# Patient Record
Sex: Male | Born: 1980 | Race: Black or African American | Hispanic: No | Marital: Married | Smoking: Former smoker
Health system: Southern US, Community
[De-identification: ages and names within clinical notes are randomized; demographics above are authoritative.]

## PROBLEM LIST (undated history)

## (undated) DIAGNOSIS — R0602 Shortness of breath: Secondary | ICD-10-CM

## (undated) DIAGNOSIS — G43909 Migraine, unspecified, not intractable, without status migrainosus: Secondary | ICD-10-CM

## (undated) DIAGNOSIS — E669 Obesity, unspecified: Secondary | ICD-10-CM

## (undated) DIAGNOSIS — E1140 Type 2 diabetes mellitus with diabetic neuropathy, unspecified: Secondary | ICD-10-CM

## (undated) DIAGNOSIS — E785 Hyperlipidemia, unspecified: Secondary | ICD-10-CM

## (undated) DIAGNOSIS — I10 Essential (primary) hypertension: Secondary | ICD-10-CM

## (undated) DIAGNOSIS — I209 Angina pectoris, unspecified: Secondary | ICD-10-CM

## (undated) DIAGNOSIS — Z87442 Personal history of urinary calculi: Secondary | ICD-10-CM

## (undated) DIAGNOSIS — F419 Anxiety disorder, unspecified: Secondary | ICD-10-CM

## (undated) DIAGNOSIS — M51369 Other intervertebral disc degeneration, lumbar region without mention of lumbar back pain or lower extremity pain: Secondary | ICD-10-CM

## (undated) DIAGNOSIS — G4730 Sleep apnea, unspecified: Secondary | ICD-10-CM

## (undated) DIAGNOSIS — M5136 Other intervertebral disc degeneration, lumbar region: Secondary | ICD-10-CM

## (undated) DIAGNOSIS — K5090 Crohn's disease, unspecified, without complications: Secondary | ICD-10-CM

## (undated) DIAGNOSIS — J189 Pneumonia, unspecified organism: Secondary | ICD-10-CM

## (undated) DIAGNOSIS — K219 Gastro-esophageal reflux disease without esophagitis: Secondary | ICD-10-CM

## (undated) DIAGNOSIS — F32A Depression, unspecified: Secondary | ICD-10-CM

## (undated) DIAGNOSIS — N200 Calculus of kidney: Secondary | ICD-10-CM

## (undated) HISTORY — DX: Personal history of urinary calculi: Z87.442

## (undated) HISTORY — DX: Depression, unspecified: F32.A

## (undated) HISTORY — DX: Hyperlipidemia, unspecified: E78.5

## (undated) HISTORY — PX: TONSILLECTOMY: SUR1361

## (undated) HISTORY — DX: Migraine, unspecified, not intractable, without status migrainosus: G43.909

## (undated) HISTORY — PX: CHOLECYSTECTOMY: SHX55

## (undated) HISTORY — PX: GASTRIC BYPASS: SHX52

## (undated) HISTORY — DX: Crohn's disease, unspecified, without complications: K50.90

## (undated) HISTORY — DX: Other intervertebral disc degeneration, lumbar region without mention of lumbar back pain or lower extremity pain: M51.369

## (undated) HISTORY — DX: Essential (primary) hypertension: I10

## (undated) HISTORY — DX: Obesity, unspecified: E66.9

## (undated) HISTORY — DX: Other intervertebral disc degeneration, lumbar region: M51.36

## (undated) HISTORY — DX: Calculus of kidney: N20.0

---

## 2004-05-30 ENCOUNTER — Emergency Department (HOSPITAL_COMMUNITY): Admission: EM | Admit: 2004-05-30 | Discharge: 2004-05-30 | Payer: Self-pay | Admitting: Emergency Medicine

## 2004-07-24 ENCOUNTER — Emergency Department (HOSPITAL_COMMUNITY): Admission: EM | Admit: 2004-07-24 | Discharge: 2004-07-24 | Payer: Self-pay | Admitting: Emergency Medicine

## 2004-08-11 ENCOUNTER — Ambulatory Visit: Payer: Self-pay | Admitting: Family Medicine

## 2004-10-04 ENCOUNTER — Emergency Department (HOSPITAL_COMMUNITY): Admission: EM | Admit: 2004-10-04 | Discharge: 2004-10-04 | Payer: Self-pay | Admitting: Emergency Medicine

## 2004-10-10 ENCOUNTER — Ambulatory Visit: Payer: Self-pay | Admitting: Family Medicine

## 2004-10-11 ENCOUNTER — Emergency Department (HOSPITAL_COMMUNITY): Admission: EM | Admit: 2004-10-11 | Discharge: 2004-10-11 | Payer: Self-pay | Admitting: Emergency Medicine

## 2004-10-15 ENCOUNTER — Encounter (HOSPITAL_COMMUNITY): Admission: RE | Admit: 2004-10-15 | Discharge: 2004-11-14 | Payer: Self-pay | Admitting: Family Medicine

## 2004-10-20 ENCOUNTER — Ambulatory Visit: Payer: Self-pay | Admitting: Family Medicine

## 2004-12-15 ENCOUNTER — Emergency Department (HOSPITAL_COMMUNITY): Admission: EM | Admit: 2004-12-15 | Discharge: 2004-12-15 | Payer: Self-pay | Admitting: Emergency Medicine

## 2005-05-01 ENCOUNTER — Emergency Department (HOSPITAL_COMMUNITY): Admission: EM | Admit: 2005-05-01 | Discharge: 2005-05-01 | Payer: Self-pay | Admitting: Emergency Medicine

## 2005-05-27 ENCOUNTER — Emergency Department (HOSPITAL_COMMUNITY): Admission: EM | Admit: 2005-05-27 | Discharge: 2005-05-28 | Payer: Self-pay | Admitting: Emergency Medicine

## 2006-10-24 ENCOUNTER — Emergency Department (HOSPITAL_COMMUNITY): Admission: EM | Admit: 2006-10-24 | Discharge: 2006-10-24 | Payer: Self-pay | Admitting: Emergency Medicine

## 2007-04-14 ENCOUNTER — Emergency Department (HOSPITAL_COMMUNITY): Admission: EM | Admit: 2007-04-14 | Discharge: 2007-04-14 | Payer: Self-pay | Admitting: Emergency Medicine

## 2007-08-07 ENCOUNTER — Emergency Department: Payer: Self-pay | Admitting: Emergency Medicine

## 2007-08-07 ENCOUNTER — Other Ambulatory Visit: Payer: Self-pay

## 2008-05-18 HISTORY — PX: KNEE ARTHROSCOPY: SUR90

## 2008-07-01 ENCOUNTER — Emergency Department (HOSPITAL_COMMUNITY): Admission: EM | Admit: 2008-07-01 | Discharge: 2008-07-01 | Payer: Self-pay | Admitting: Emergency Medicine

## 2008-07-24 ENCOUNTER — Emergency Department (HOSPITAL_COMMUNITY): Admission: EM | Admit: 2008-07-24 | Discharge: 2008-07-24 | Payer: Self-pay | Admitting: Emergency Medicine

## 2009-02-19 ENCOUNTER — Emergency Department (HOSPITAL_COMMUNITY): Admission: EM | Admit: 2009-02-19 | Discharge: 2009-02-19 | Payer: Self-pay | Admitting: Emergency Medicine

## 2009-02-26 ENCOUNTER — Emergency Department (HOSPITAL_COMMUNITY): Admission: EM | Admit: 2009-02-26 | Discharge: 2009-02-26 | Payer: Self-pay | Admitting: Emergency Medicine

## 2009-04-17 DIAGNOSIS — J189 Pneumonia, unspecified organism: Secondary | ICD-10-CM

## 2009-04-17 HISTORY — DX: Pneumonia, unspecified organism: J18.9

## 2009-05-18 HISTORY — PX: LUMBAR FUSION: SHX111

## 2009-05-21 ENCOUNTER — Ambulatory Visit: Payer: Self-pay | Admitting: Internal Medicine

## 2009-05-21 DIAGNOSIS — G43909 Migraine, unspecified, not intractable, without status migrainosus: Secondary | ICD-10-CM | POA: Insufficient documentation

## 2009-05-21 DIAGNOSIS — Z87898 Personal history of other specified conditions: Secondary | ICD-10-CM

## 2009-05-21 DIAGNOSIS — J45909 Unspecified asthma, uncomplicated: Secondary | ICD-10-CM | POA: Insufficient documentation

## 2009-05-21 DIAGNOSIS — Z87442 Personal history of urinary calculi: Secondary | ICD-10-CM | POA: Insufficient documentation

## 2009-05-21 DIAGNOSIS — I10 Essential (primary) hypertension: Secondary | ICD-10-CM | POA: Insufficient documentation

## 2009-05-23 ENCOUNTER — Encounter (INDEPENDENT_AMBULATORY_CARE_PROVIDER_SITE_OTHER): Payer: Self-pay | Admitting: *Deleted

## 2009-05-27 ENCOUNTER — Ambulatory Visit: Payer: Self-pay | Admitting: Internal Medicine

## 2009-05-27 DIAGNOSIS — IMO0002 Reserved for concepts with insufficient information to code with codable children: Secondary | ICD-10-CM | POA: Insufficient documentation

## 2009-07-31 ENCOUNTER — Emergency Department (HOSPITAL_COMMUNITY): Admission: EM | Admit: 2009-07-31 | Discharge: 2009-07-31 | Payer: Self-pay | Admitting: Emergency Medicine

## 2009-08-01 ENCOUNTER — Ambulatory Visit: Payer: Self-pay | Admitting: Internal Medicine

## 2009-08-01 DIAGNOSIS — M545 Low back pain, unspecified: Secondary | ICD-10-CM | POA: Insufficient documentation

## 2009-08-05 LAB — CONVERTED CEMR LAB
Basophils Absolute: 0 10*3/uL (ref 0.0–0.1)
Basophils Relative: 0.3 % (ref 0.0–3.0)
Eosinophils Absolute: 0 10*3/uL (ref 0.0–0.7)
Eosinophils Relative: 0.7 % (ref 0.0–5.0)
HCT: 43.3 % (ref 39.0–52.0)
Hemoglobin: 14.3 g/dL (ref 13.0–17.0)
Lymphocytes Relative: 42.9 % (ref 12.0–46.0)
Lymphs Abs: 1.9 10*3/uL (ref 0.7–4.0)
MCHC: 33.2 g/dL (ref 30.0–36.0)
MCV: 89.5 fL (ref 78.0–100.0)
Monocytes Absolute: 0.6 10*3/uL (ref 0.1–1.0)
Monocytes Relative: 13.4 % — ABNORMAL HIGH (ref 3.0–12.0)
Neutro Abs: 1.8 10*3/uL (ref 1.4–7.7)
Neutrophils Relative %: 42.7 % — ABNORMAL LOW (ref 43.0–77.0)
Platelets: 149 10*3/uL — ABNORMAL LOW (ref 150.0–400.0)
RBC: 4.84 M/uL (ref 4.22–5.81)
RDW: 12.4 % (ref 11.5–14.6)
WBC: 4.3 10*3/uL — ABNORMAL LOW (ref 4.5–10.5)

## 2009-10-06 ENCOUNTER — Encounter: Payer: Self-pay | Admitting: Internal Medicine

## 2009-10-11 ENCOUNTER — Ambulatory Visit: Payer: Self-pay | Admitting: Internal Medicine

## 2009-10-11 DIAGNOSIS — F17200 Nicotine dependence, unspecified, uncomplicated: Secondary | ICD-10-CM

## 2009-10-11 DIAGNOSIS — E876 Hypokalemia: Secondary | ICD-10-CM

## 2009-10-11 DIAGNOSIS — L259 Unspecified contact dermatitis, unspecified cause: Secondary | ICD-10-CM

## 2009-10-11 DIAGNOSIS — R1013 Epigastric pain: Secondary | ICD-10-CM

## 2009-10-27 ENCOUNTER — Encounter: Payer: Self-pay | Admitting: Internal Medicine

## 2009-10-30 ENCOUNTER — Emergency Department: Payer: Self-pay | Admitting: Emergency Medicine

## 2009-11-03 ENCOUNTER — Encounter: Payer: Self-pay | Admitting: Internal Medicine

## 2009-11-03 ENCOUNTER — Emergency Department: Payer: Self-pay | Admitting: Emergency Medicine

## 2009-11-08 ENCOUNTER — Encounter: Payer: Self-pay | Admitting: Internal Medicine

## 2009-11-08 DIAGNOSIS — K219 Gastro-esophageal reflux disease without esophagitis: Secondary | ICD-10-CM

## 2009-12-01 ENCOUNTER — Emergency Department: Payer: Self-pay | Admitting: Emergency Medicine

## 2009-12-13 ENCOUNTER — Encounter: Payer: Self-pay | Admitting: Internal Medicine

## 2009-12-13 ENCOUNTER — Emergency Department: Payer: Self-pay | Admitting: Emergency Medicine

## 2009-12-20 ENCOUNTER — Emergency Department: Payer: Self-pay | Admitting: Emergency Medicine

## 2010-01-01 ENCOUNTER — Encounter: Payer: Self-pay | Admitting: Internal Medicine

## 2010-01-29 ENCOUNTER — Emergency Department: Payer: Self-pay | Admitting: Emergency Medicine

## 2010-01-29 ENCOUNTER — Encounter: Payer: Self-pay | Admitting: Internal Medicine

## 2010-02-03 ENCOUNTER — Ambulatory Visit: Payer: Self-pay | Admitting: Internal Medicine

## 2010-02-03 DIAGNOSIS — M5137 Other intervertebral disc degeneration, lumbosacral region: Secondary | ICD-10-CM

## 2010-02-03 DIAGNOSIS — R42 Dizziness and giddiness: Secondary | ICD-10-CM

## 2010-02-17 ENCOUNTER — Emergency Department: Payer: Self-pay | Admitting: Emergency Medicine

## 2010-04-12 ENCOUNTER — Emergency Department (HOSPITAL_COMMUNITY): Admission: EM | Admit: 2010-04-12 | Discharge: 2010-04-12 | Payer: Self-pay | Admitting: Emergency Medicine

## 2010-04-30 ENCOUNTER — Inpatient Hospital Stay (HOSPITAL_COMMUNITY)
Admission: RE | Admit: 2010-04-30 | Discharge: 2010-05-04 | Payer: Self-pay | Source: Home / Self Care | Attending: Neurological Surgery | Admitting: Neurological Surgery

## 2010-04-30 HISTORY — PX: LUMBAR FUSION: SHX111

## 2010-04-30 HISTORY — PX: BACK SURGERY: SHX140

## 2010-05-06 ENCOUNTER — Emergency Department: Payer: Self-pay | Admitting: Unknown Physician Specialty

## 2010-05-09 ENCOUNTER — Encounter
Admission: RE | Admit: 2010-05-09 | Discharge: 2010-05-09 | Payer: Self-pay | Source: Home / Self Care | Attending: Neurological Surgery | Admitting: Neurological Surgery

## 2010-05-16 ENCOUNTER — Emergency Department (HOSPITAL_COMMUNITY)
Admission: EM | Admit: 2010-05-16 | Discharge: 2010-05-16 | Payer: Self-pay | Source: Home / Self Care | Admitting: Emergency Medicine

## 2010-05-17 ENCOUNTER — Encounter: Payer: Self-pay | Admitting: Internal Medicine

## 2010-05-18 ENCOUNTER — Encounter: Payer: Self-pay | Admitting: Internal Medicine

## 2010-05-18 ENCOUNTER — Inpatient Hospital Stay: Payer: Self-pay | Admitting: Internal Medicine

## 2010-05-18 DIAGNOSIS — M961 Postlaminectomy syndrome, not elsewhere classified: Secondary | ICD-10-CM | POA: Insufficient documentation

## 2010-05-18 HISTORY — PX: KIDNEY STONE SURGERY: SHX686

## 2010-05-18 HISTORY — PX: WISDOM TOOTH EXTRACTION: SHX21

## 2010-05-19 ENCOUNTER — Encounter: Payer: Self-pay | Admitting: Internal Medicine

## 2010-05-20 ENCOUNTER — Encounter: Payer: Self-pay | Admitting: Internal Medicine

## 2010-05-21 ENCOUNTER — Ambulatory Visit
Admission: RE | Admit: 2010-05-21 | Discharge: 2010-05-21 | Payer: Self-pay | Source: Home / Self Care | Attending: Internal Medicine | Admitting: Internal Medicine

## 2010-05-21 DIAGNOSIS — E860 Dehydration: Secondary | ICD-10-CM | POA: Insufficient documentation

## 2010-05-21 DIAGNOSIS — B369 Superficial mycosis, unspecified: Secondary | ICD-10-CM | POA: Insufficient documentation

## 2010-05-21 DIAGNOSIS — J158 Pneumonia due to other specified bacteria: Secondary | ICD-10-CM | POA: Insufficient documentation

## 2010-05-21 LAB — CONVERTED CEMR LAB
Bilirubin Urine: NEGATIVE
Glucose, Urine, Semiquant: NEGATIVE
Ketones, urine, test strip: NEGATIVE
Urobilinogen, UA: 0.2
pH: 8.5

## 2010-05-22 ENCOUNTER — Other Ambulatory Visit: Payer: Self-pay | Admitting: Internal Medicine

## 2010-05-22 ENCOUNTER — Ambulatory Visit
Admission: RE | Admit: 2010-05-22 | Discharge: 2010-05-22 | Payer: Self-pay | Source: Home / Self Care | Attending: Internal Medicine | Admitting: Internal Medicine

## 2010-05-22 LAB — CBC WITH DIFFERENTIAL/PLATELET
Basophils Absolute: 0 10*3/uL (ref 0.0–0.1)
Basophils Relative: 0.3 % (ref 0.0–3.0)
Eosinophils Absolute: 0 10*3/uL (ref 0.0–0.7)
Eosinophils Relative: 0.6 % (ref 0.0–5.0)
HCT: 43.2 % (ref 39.0–52.0)
Hemoglobin: 14.7 g/dL (ref 13.0–17.0)
Lymphocytes Relative: 34.8 % (ref 12.0–46.0)
Lymphs Abs: 2.3 10*3/uL (ref 0.7–4.0)
MCHC: 34 g/dL (ref 30.0–36.0)
MCV: 86.9 fl (ref 78.0–100.0)
Monocytes Absolute: 0.7 10*3/uL (ref 0.1–1.0)
Monocytes Relative: 10.7 % (ref 3.0–12.0)
Neutro Abs: 3.6 10*3/uL (ref 1.4–7.7)
Neutrophils Relative %: 53.6 % (ref 43.0–77.0)
Platelets: 173 10*3/uL (ref 150.0–400.0)
RBC: 4.96 Mil/uL (ref 4.22–5.81)
RDW: 13.4 % (ref 11.5–14.6)
WBC: 6.7 10*3/uL (ref 4.5–10.5)

## 2010-05-22 LAB — BASIC METABOLIC PANEL
BUN: 15 mg/dL (ref 6–23)
CO2: 28 mEq/L (ref 19–32)
Calcium: 9.2 mg/dL (ref 8.4–10.5)
Chloride: 99 mEq/L (ref 96–112)
Creatinine, Ser: 1 mg/dL (ref 0.4–1.5)
GFR: 119.86 mL/min (ref >60.00)
Glucose, Bld: 95 mg/dL (ref 70–99)
Potassium: 4.2 mEq/L (ref 3.5–5.1)
Sodium: 136 mEq/L (ref 135–145)

## 2010-05-22 LAB — HEPATIC FUNCTION PANEL
ALT: 61 U/L — ABNORMAL HIGH (ref 0–53)
AST: 42 U/L — ABNORMAL HIGH (ref 0–37)
Albumin: 3.8 g/dL (ref 3.5–5.2)
Alkaline Phosphatase: 52 U/L (ref 39–117)
Bilirubin, Direct: 0.1 mg/dL (ref 0.0–0.3)
Total Bilirubin: 0.6 mg/dL (ref 0.3–1.2)
Total Protein: 6.9 g/dL (ref 6.0–8.3)

## 2010-05-23 ENCOUNTER — Encounter: Payer: Self-pay | Admitting: Internal Medicine

## 2010-05-23 ENCOUNTER — Telehealth: Payer: Self-pay | Admitting: Internal Medicine

## 2010-05-24 ENCOUNTER — Observation Stay (HOSPITAL_COMMUNITY)
Admission: EM | Admit: 2010-05-24 | Discharge: 2010-05-27 | Payer: Self-pay | Source: Home / Self Care | Attending: Internal Medicine | Admitting: Internal Medicine

## 2010-05-26 ENCOUNTER — Encounter (INDEPENDENT_AMBULATORY_CARE_PROVIDER_SITE_OTHER): Payer: Self-pay | Admitting: Internal Medicine

## 2010-05-27 ENCOUNTER — Encounter
Admission: RE | Admit: 2010-05-27 | Discharge: 2010-05-27 | Payer: Self-pay | Source: Home / Self Care | Attending: Neurological Surgery | Admitting: Neurological Surgery

## 2010-06-02 LAB — CBC
HCT: 41.4 % (ref 39.0–52.0)
HCT: 36.5 % — ABNORMAL LOW (ref 39.0–52.0)
HCT: 41.6 % (ref 39.0–52.0)
Hemoglobin: 13.6 g/dL (ref 13.0–17.0)
Hemoglobin: 12.1 g/dL — ABNORMAL LOW (ref 13.0–17.0)
Hemoglobin: 14 g/dL (ref 13.0–17.0)
MCH: 28.8 pg (ref 26.0–34.0)
MCH: 29.1 pg (ref 26.0–34.0)
MCH: 29 pg (ref 26.0–34.0)
MCHC: 32.9 g/dL (ref 30.0–36.0)
MCHC: 33.2 g/dL (ref 30.0–36.0)
MCHC: 33.7 g/dL (ref 30.0–36.0)
MCV: 87.5 fL (ref 78.0–100.0)
MCV: 87.7 fL (ref 78.0–100.0)
MCV: 86.3 fL (ref 78.0–100.0)
Platelets: 145 10*3/uL — ABNORMAL LOW (ref 150–400)
Platelets: 131 10*3/uL — ABNORMAL LOW (ref 150–400)
Platelets: 144 10*3/uL — ABNORMAL LOW (ref 150–400)
RBC: 4.73 MIL/uL (ref 4.22–5.81)
RBC: 4.16 MIL/uL — ABNORMAL LOW (ref 4.22–5.81)
RBC: 4.82 MIL/uL (ref 4.22–5.81)
RDW: 12.5 % (ref 11.5–15.5)
RDW: 12.7 % (ref 11.5–15.5)
RDW: 12.3 % (ref 11.5–15.5)
WBC: 7.5 10*3/uL (ref 4.0–10.5)
WBC: 5.7 10*3/uL (ref 4.0–10.5)
WBC: 4.7 10*3/uL (ref 4.0–10.5)

## 2010-06-02 LAB — DIFFERENTIAL
Basophils Absolute: 0 10*3/uL (ref 0.0–0.1)
Basophils Absolute: 0 10*3/uL (ref 0.0–0.1)
Basophils Relative: 0 % (ref 0–1)
Basophils Relative: 0 % (ref 0–1)
Eosinophils Absolute: 0.1 10*3/uL (ref 0.0–0.7)
Eosinophils Absolute: 0.1 10*3/uL (ref 0.0–0.7)
Eosinophils Relative: 1 % (ref 0–5)
Eosinophils Relative: 1 % (ref 0–5)
Lymphocytes Relative: 24 % (ref 12–46)
Lymphocytes Relative: 25 % (ref 12–46)
Lymphs Abs: 1.8 10*3/uL (ref 0.7–4.0)
Lymphs Abs: 1.4 10*3/uL (ref 0.7–4.0)
Monocytes Absolute: 0.8 10*3/uL (ref 0.1–1.0)
Monocytes Absolute: 0.7 10*3/uL (ref 0.1–1.0)
Monocytes Relative: 11 % (ref 3–12)
Monocytes Relative: 12 % (ref 3–12)
Neutro Abs: 4.8 10*3/uL (ref 1.7–7.7)
Neutro Abs: 3.5 10*3/uL (ref 1.7–7.7)
Neutrophils Relative %: 64 % (ref 43–77)
Neutrophils Relative %: 62 % (ref 43–77)

## 2010-06-02 LAB — HEMOGLOBIN A1C
Hgb A1c MFr Bld: 5.9 % — ABNORMAL HIGH (ref <5.7)
Hgb A1c MFr Bld: 5.7 % — ABNORMAL HIGH (ref <5.7)
Mean Plasma Glucose: 123 mg/dL — ABNORMAL HIGH (ref <117)
Mean Plasma Glucose: 117 mg/dL — ABNORMAL HIGH (ref <117)

## 2010-06-02 LAB — BASIC METABOLIC PANEL
BUN: 10 mg/dL (ref 6–23)
BUN: 8 mg/dL (ref 6–23)
CO2: 27 mEq/L (ref 19–32)
CO2: 30 mEq/L (ref 19–32)
Calcium: 8.6 mg/dL (ref 8.4–10.5)
Calcium: 9.5 mg/dL (ref 8.4–10.5)
Chloride: 106 mEq/L (ref 96–112)
Chloride: 99 mEq/L (ref 96–112)
Creatinine, Ser: 1.03 mg/dL (ref 0.4–1.5)
Creatinine, Ser: 1.07 mg/dL (ref 0.4–1.5)
GFR calc Af Amer: >60 mL/min (ref >60)
GFR calc Af Amer: >60 mL/min (ref >60)
GFR calc non Af Amer: >60 mL/min (ref >60)
GFR calc non Af Amer: >60 mL/min (ref >60)
Glucose, Bld: 108 mg/dL — ABNORMAL HIGH (ref 70–99)
Glucose, Bld: 93 mg/dL (ref 70–99)
Potassium: 3.7 mEq/L (ref 3.5–5.1)
Potassium: 4.4 mEq/L (ref 3.5–5.1)
Sodium: 139 mEq/L (ref 135–145)
Sodium: 140 mEq/L (ref 135–145)

## 2010-06-02 LAB — TROPONIN I
Troponin I: 0.01 ng/mL (ref 0.00–0.06)
Troponin I: 0.01 ng/mL (ref 0.00–0.06)
Troponin I: 0.01 ng/mL (ref 0.00–0.06)

## 2010-06-02 LAB — CK TOTAL AND CKMB (NOT AT ARMC)
CK, MB: 0.6 ng/mL (ref 0.3–4.0)
CK, MB: 0.7 ng/mL (ref 0.3–4.0)
CK, MB: 0.8 ng/mL (ref 0.3–4.0)
Relative Index: INVALID (ref 0.0–2.5)
Relative Index: INVALID (ref 0.0–2.5)
Relative Index: INVALID (ref 0.0–2.5)
Total CK: 88 U/L (ref 7–232)
Total CK: 87 U/L (ref 7–232)
Total CK: 91 U/L (ref 7–232)

## 2010-06-02 LAB — GLUCOSE, CAPILLARY
Glucose-Capillary: 101 mg/dL — ABNORMAL HIGH (ref 70–99)
Glucose-Capillary: 107 mg/dL — ABNORMAL HIGH (ref 70–99)
Glucose-Capillary: 107 mg/dL — ABNORMAL HIGH (ref 70–99)
Glucose-Capillary: 56 mg/dL — ABNORMAL LOW (ref 70–99)
Glucose-Capillary: 59 mg/dL — ABNORMAL LOW (ref 70–99)
Glucose-Capillary: 70 mg/dL (ref 70–99)
Glucose-Capillary: 85 mg/dL (ref 70–99)
Glucose-Capillary: 101 mg/dL — ABNORMAL HIGH (ref 70–99)
Glucose-Capillary: 129 mg/dL — ABNORMAL HIGH (ref 70–99)
Glucose-Capillary: 55 mg/dL — ABNORMAL LOW (ref 70–99)
Glucose-Capillary: 68 mg/dL — ABNORMAL LOW (ref 70–99)
Glucose-Capillary: 83 mg/dL (ref 70–99)
Glucose-Capillary: 83 mg/dL (ref 70–99)
Glucose-Capillary: 85 mg/dL (ref 70–99)
Glucose-Capillary: 85 mg/dL (ref 70–99)
Glucose-Capillary: 98 mg/dL (ref 70–99)

## 2010-06-02 LAB — POCT I-STAT, CHEM 8
BUN: 13 mg/dL (ref 6–23)
Calcium, Ion: 1.18 mmol/L (ref 1.12–1.32)
Chloride: 104 mEq/L (ref 96–112)
Creatinine, Ser: 1.2 mg/dL (ref 0.4–1.5)
Glucose, Bld: 96 mg/dL (ref 70–99)
HCT: 42 % (ref 39.0–52.0)
Hemoglobin: 14.3 g/dL (ref 13.0–17.0)
Potassium: 3.7 mEq/L (ref 3.5–5.1)
Sodium: 142 mEq/L (ref 135–145)
TCO2: 29 mmol/L (ref 0–100)

## 2010-06-02 LAB — POCT CARDIAC MARKERS
CKMB, poc: <1 ng/mL — ABNORMAL LOW (ref 1.0–8.0)
Myoglobin, poc: 28 ng/mL (ref 12–200)
Troponin i, poc: <0.05 ng/mL (ref 0.00–0.09)

## 2010-06-02 LAB — LIPID PANEL
Cholesterol: 191 mg/dL (ref 0–200)
HDL: 51 mg/dL (ref >39)
LDL Cholesterol: 127 mg/dL — ABNORMAL HIGH (ref 0–99)
Total CHOL/HDL Ratio: 3.7 RATIO
Triglycerides: 67 mg/dL (ref <150)
VLDL: 13 mg/dL (ref 0–40)

## 2010-06-02 LAB — TSH: TSH: 6.709 u[IU]/mL — ABNORMAL HIGH (ref 0.350–4.500)

## 2010-06-02 LAB — RAPID URINE DRUG SCREEN, HOSP PERFORMED
Amphetamines: NOT DETECTED
Barbiturates: NOT DETECTED
Benzodiazepines: NOT DETECTED
Cocaine: NOT DETECTED
Opiates: POSITIVE — AB
Tetrahydrocannabinol: NOT DETECTED

## 2010-06-02 LAB — OCCULT BLOOD, POC DEVICE: Fecal Occult Bld: POSITIVE

## 2010-06-03 ENCOUNTER — Encounter (INDEPENDENT_AMBULATORY_CARE_PROVIDER_SITE_OTHER): Payer: Self-pay | Admitting: *Deleted

## 2010-06-06 ENCOUNTER — Ambulatory Visit
Admission: RE | Admit: 2010-06-06 | Discharge: 2010-06-06 | Payer: Self-pay | Source: Home / Self Care | Attending: Internal Medicine | Admitting: Internal Medicine

## 2010-06-06 ENCOUNTER — Ambulatory Visit
Admission: RE | Admit: 2010-06-06 | Discharge: 2010-06-06 | Payer: Self-pay | Source: Home / Self Care | Attending: Gastroenterology | Admitting: Gastroenterology

## 2010-06-06 DIAGNOSIS — R7309 Other abnormal glucose: Secondary | ICD-10-CM | POA: Insufficient documentation

## 2010-06-15 LAB — CONVERTED CEMR LAB
ALT: 23 units/L (ref 0–53)
AST: 23 units/L (ref 0–37)
Albumin: 4.2 g/dL (ref 3.5–5.2)
Alkaline Phosphatase: 43 units/L (ref 39–117)
BUN: 10 mg/dL (ref 6–23)
Basophils Absolute: 0 10*3/uL (ref 0.0–0.1)
Basophils Relative: 1.3 % (ref 0.0–3.0)
Bilirubin, Direct: 0.2 mg/dL (ref 0.0–0.3)
CO2: 29 meq/L (ref 19–32)
Calcium: 9.5 mg/dL (ref 8.4–10.5)
Chloride: 103 meq/L (ref 96–112)
Creatinine, Ser: 1 mg/dL (ref 0.4–1.5)
Eosinophils Absolute: 0 10*3/uL (ref 0.0–0.7)
Eosinophils Relative: 0.5 % (ref 0.0–5.0)
GFR calc non Af Amer: 113.76 mL/min (ref >60)
Glucose, Bld: 83 mg/dL (ref 70–99)
HCT: 43.8 % (ref 39.0–52.0)
Hemoglobin: 14.4 g/dL (ref 13.0–17.0)
Hgb A1c MFr Bld: 5.6 % (ref 4.6–6.5)
Lymphocytes Relative: 59.9 % — ABNORMAL HIGH (ref 12.0–46.0)
Lymphs Abs: 1.3 10*3/uL (ref 0.7–4.0)
MCHC: 32.9 g/dL (ref 30.0–36.0)
MCV: 88.6 fL (ref 78.0–100.0)
Monocytes Absolute: 0.3 10*3/uL (ref 0.1–1.0)
Monocytes Relative: 13.6 % — ABNORMAL HIGH (ref 3.0–12.0)
Neutro Abs: 0.5 10*3/uL — ABNORMAL LOW (ref 1.4–7.7)
Neutrophils Relative %: 24.7 % — ABNORMAL LOW (ref 43.0–77.0)
Platelets: 159 10*3/uL (ref 150.0–400.0)
Potassium: 4.1 meq/L (ref 3.5–5.1)
RBC: 4.95 M/uL (ref 4.22–5.81)
RDW: 12.3 % (ref 11.5–14.6)
Sodium: 139 meq/L (ref 135–145)
TSH: 0.81 microintl units/mL (ref 0.35–5.50)
Total Bilirubin: 1.5 mg/dL — ABNORMAL HIGH (ref 0.3–1.2)
Total Protein: 7.3 g/dL (ref 6.0–8.3)
WBC: 2.1 10*3/uL — ABNORMAL LOW (ref 4.5–10.5)
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
## 2010-06-17 NOTE — Miscellaneous (Signed)
Summary: Orders Update  Clinical Lists Changes  Problems: Added new problem of GERD (ICD-530.81) Orders: Added new Referral order of Misc. Referral (Misc. Ref) - Signed

## 2010-06-17 NOTE — Assessment & Plan Note (Signed)
Summary: FOLLOW UP FROM ER/PH   Vital Signs:  Patient profile:   30 year old male Weight:      255.6 pounds BMI:     36.55 Temp:     98.5 degrees F oral Pulse rate:   76 / minute Resp:     15 per minute BP sitting:   120 / 82  (left arm) Cuff size:   large  Vitals Entered By: Shonna Chock CMA (February 03, 2010 11:08 AM) CC: Emergency Room follow-up [facility]), patient felt as if he was going to pass out, Syncope   CC:  Emergency Room follow-up [facility]), patient felt as if he was going to pass out, and Syncope.  History of Present Illness: Pre-Syncope      This is a 30 year old man who presents with  Pre-Syncope.  The patient reports  no premonitory symptoms  prior to  near loss of consciousness  which was associated with tachycardia &  palpitations, and shortness of breath.He denies loss of consciousness, chest pain, seizures, and incontinence.  Associated symptoms include  headache over anterior crown , nausea, and diaphoresis.  The patient denies the following symptoms: abdominal discomfort, vomiting, feeling warm, blurred vision, and perioral numbness. He had tingling in LUE & both legs. The patient reports  no  precipitating factors. The  near loss  of consciousness occurred in the setting of supine position, simply reaching out to pick papers w/o change in position(no  BPV). He had taken 2 Percocets (for back) 8 hrs prior to event.Records from  ER evaluation 09/14  have  been requested. That  evaluation  included  EKG & labs. N-S for Lumbar  DDD planned 12/14 by Dr Yetta Barre.Dr Farris Has originally Rxed Percocet , but Dr Yetta Barre has continued . He feels the narcotic is affecting his ability to work.He takes 4 pills / day.  Allergies (verified): No Known Drug Allergies  Past History:  Past Medical History:  HYPERTENSION (ICD-401.9)  HYPERLIPIDEMIA, MILD, HX OF (ICD-V13.8): LDL 129 (1293/425) ,HDL 48,TG 27(verified). LDL goal = < 130,ideally 100  RENAL CALCULUS, HX OF  (ICD-V13.01) MIGRAINE HEADACHE (ICD-346.90) ASTHMA (ICD-493.90) DDD Lumbar Spine, Dr Yetta Barre, N-S  Review of Systems ENT:  Denies decreased hearing and ringing in ears. Neuro:  Denies brief paralysis, sensation of room spinning, and weakness.  Physical Exam  General:  well-nourished,in no acute distress; alert,appropriate and cooperative throughout examination Eyes:  No corneal or conjunctival inflammation noted. EOMI. Perrla. Field of  Vision grossly normal. Ears:  External ear exam shows no significant lesions or deformities.  Otoscopic examination reveals clear canals, tympanic membranes are intact bilaterally without bulging, retraction, inflammation or discharge. Hearing is grossly normal bilaterally. Heart:  Normal rate and regular rhythm. S1 and S2 normal without gallop, murmur, click, rub or other extra sounds. Pulses:  R and L carotid pulses are full and equal bilaterallyw/o bruits Extremities:  No clubbing, cyanosis, edema. Neurologic:  alert & oriented X3, cranial nerves II-XII intact, strength normal in all extremities, gait abnormal( slight foot drop on R), DTRs symmetrical and normal, finger-to-nose normal, and Romberg negative.   Skin:  Intact without suspicious lesions or rashes Cervical Nodes:  No lymphadenopathy noted Axillary Nodes:  No palpable lymphadenopathy Psych:  memory intact for recent and remote, normally interactive, good eye contact, and not anxious appearing.     Impression & Recommendations:  Problem # 1:  DIZZINESS (ICD-780.4)  Lightheadedness & Pre Syncope  Orders: Neurosurgeon Referral (Neurosurgeon)  Problem # 2:  DEGENERATIVE DISC DISEASE, LUMBOSACRAL SPINE (ICD-722.52)  as per Dr Yetta Barre  Orders: Neurosurgeon Referral (Neurosurgeon)  Complete Medication List: 1)  Vitamin C 500 Mg Tabs (Ascorbic acid) .Marland Kitchen.. 1 by mouth once daily 2)  Gabapentin 100 Mg Caps (Gabapentin) .Marland Kitchen.. 1 q 8 as needed as needed 3)  Spironolactone 25 Mg Tabs  (Spironolactone) .Marland Kitchen.. 1 once daily as needed edema 4)  Bupropion Hcl 150 Mg Xr24h-tab (Bupropion hcl) .Marland Kitchen.. 1 once daily 5)  Ranitidine Hcl 150 Mg Tabs (Ranitidine hcl) .Marland Kitchen.. 1 pill every 12 hrs pre meal 6)  Percocet 5-325 Mg Tabs (Oxycodone-acetaminophen) .Marland Kitchen.. 1 by mouth every 6-8 hours as needed (rx'ed in emergency room)  Patient Instructions: 1)  I'll review the ER records & recommend further Please see Dr Yetta Barre. because of the persistant back pain requiring narcotic medication.                                                                                            Appendum from FAXed ER records: D dimer, troponin , CPK MB,CBC , CXray normal. Glucose 142, CPK 264. [place] of abd:L Nephrolithiasis,parapelvic cystsvs minimal minimal L  hydronephrosis.

## 2010-06-17 NOTE — Assessment & Plan Note (Signed)
Summary: f/u stomache pain/cbs   Vital Signs:  Patient profile:   30 year old male Weight:      243.2 pounds BMI:     34.77 Temp:     98.6 degrees F oral Pulse rate:   72 / minute Resp:     16 per minute BP sitting:   118 / 76  (left arm) Cuff size:   large  Vitals Entered By: Shonna Chock (Oct 11, 2009 4:15 PM) CC: 1.) Follow-up visit: Stomach pains, patient was seen at UC  2.) Discuss Smoking  3.)Discuss Weight  4.) Hands peeling off/on 5.) Refill Tramadol and Cyclobenzaprine Comments REVIEWED MED LIST, PATIENT AGREED DOSE AND INSTRUCTION CORRECT    CC:  1.) Follow-up visit: Stomach pains and patient was seen at UC  2.) Discuss Smoking  3.)Discuss Weight  4.) Hands peeling off/on 5.) Refill Tramadol and Cyclobenzaprine.  History of Present Illness: He was seen @ [facility] 10/06/2009 for dizziness , lightheadedness with near syncope in context of SSchest pain. Mylanta had helped that pain. Hypokalemia  & ERD diagnosed. Prilosec recommended & K+ Rxed. Symptoms better within 36 hrs of K+ supplementation. He has been on HCTZ 25 mg  once daily for edema from Urologist.He was being treated for renal calculi which have been > 3 months ago.   Allergies (verified): No Known Drug Allergies  Review of Systems ENT:  Denies difficulty swallowing and hoarseness. GI:  Complains of abdominal pain, constipation, and indigestion; denies bloody stools and dark tarry stools; Cramping pain with constipation. Derm:  Complains of changes in color of skin, itching, and rash; Hands peeling ? from hand sanitizer . Latex causes dermatitis.Marland Kitchen Psych:  Complains of anxiety, easily angered, and irritability; denies depression and easily tearful; He has started sm? had PTSD.oking due to work stresses; now 1&1/2 ppd. Anxiety in FH; his father .  Physical Exam  General:  in no acute distress; alert,appropriate and cooperative throughout examination Eyes:  No corneal or conjunctival inflammation noted. EOMI.  Perrla.No icterus Mouth:  Oral mucosa and oropharynx without lesions or exudates.  Teeth in good repair. No pharyngeal erythema.   Neck:  No deformities, masses, or tenderness noted. Thyroid full w/o nodules Lungs:  Normal respiratory effort, chest expands symmetrically. Lungs are clear to auscultation, no crackles or wheezes. Heart:  Normal rate and regular rhythm. S1 and S2 normal without gallop, murmur, click, rub or other extra sounds. Abdomen:  Bowel sounds positive,abdomen soft and non-tender without masses, organomegaly or hernias noted. Striae Extremities:  No clubbing, cyanosis, edema.  Neurologic:  alert & oriented X3 and DTRs symmetrical and normal.   Skin:  Mild exfoliative hand changes Cervical Nodes:  No lymphadenopathy noted Axillary Nodes:  No palpable lymphadenopathy Psych:  memory intact for recent and remote, normally interactive, good eye contact, not anxious appearing, and not depressed appearing.     Impression & Recommendations:  Problem # 1:  ABDOMINAL PAIN, EPIGASTRIC (ICD-789.06) Assessment Unchanged GERD suspected   Problem # 2:  HYPOKALEMIA (ICD-276.8) Probably from HCTZ  Problem # 3:  CONTACT DERMATITIS (ICD-692.9) contact vs excess drying from hand sanitizer  Problem # 4:  SMOKER (ICD-305.1)  Complete Medication List: 1)  Diuretic Tabs (Buchu-cornsilk-ch grass-hydran) .Marland Kitchen.. 1 by mouth once daily 2)  Vitamin C 500 Mg Tabs (Ascorbic acid) .Marland Kitchen.. 1 by mouth once daily 3)  Zinc 50 Mg Tabs (Zinc) .Marland Kitchen.. 1 by mouth once daily 4)  Super Energy Herbal Complex Tabs (Misc natural products) .Marland Kitchen.. 1 by mouth  once daily 5)  Garlic Oil 1000 Mg Caps (Garlic) .Marland Kitchen.. 1 by mouth once daily 6)  Fish Oil 1200 Mg Caps (Omega-3 fatty acids) .Marland Kitchen.. 1 by mouth once daily 7)  Sm Coral Calcium 1000 (390 Ca) Mg Tabs (Coral calcium) .Marland Kitchen.. 1 by mouth once daily 8)  Ginkoba 40 Mg Tabs (Ginkgo biloba) .Marland Kitchen.. 1 by mouth once daily 9)  Ginger Root 550 Mg Caps (Ginger (zingiber officinalis)) .Marland Kitchen..  1 by mouth once daily 10)  Gabapentin 100 Mg Caps (Gabapentin) .Marland Kitchen.. 1 q 8 as needed as needed 11)  Tramadol Hcl 50 Mg Tabs (Tramadol hcl) .Marland Kitchen.. 1 q 6 hrs as needed pain 12)  Cyclobenzaprine Hcl 5 Mg Tabs (Cyclobenzaprine hcl) .Marland Kitchen.. 1-2 at bedtime as needed 13)  Klor-con 20 Meq Pack (Potassium chloride) .Marland Kitchen.. 1 by mouth once daily 14)  Spironolactone 25 Mg Tabs (Spironolactone) .Marland Kitchen.. 1 once daily as needed edema 15)  Bupropion Hcl 150 Mg Xr24h-tab (Bupropion hcl) .Marland Kitchen.. 1 once daily 16)  Ranitidine Hcl 150 Mg Tabs (Ranitidine hcl) .Marland Kitchen.. 1 pill every 12 hrs pre meal  Patient Instructions: 1)  Consume LESS THAN 40 grams of "sugar"/ day from foods & drinks with High Fructose Corn Syrup as #1,2 or # 3 on the label. 2)  Avoid foods high in acid (tomatoes, citrus juices, spicy foods). Avoid eating within two hours of lying down or before exercising. Do not over eat; try smaller more frequent meals. Elevate head of bed twelve inches when sleeping. Check BUN, creat , & K+ in 5-7 days (276.8). Mix Cort Aid 1 : 1 with Eucerin and apply two times a day to hands as needed  for rash /itching Prescriptions: RANITIDINE HCL 150 MG TABS (RANITIDINE HCL) 1 pill every 12 hrs pre meal  #60 x 2   Entered and Authorized by:   Marga Melnick MD   Signed by:   Marga Melnick MD on 10/11/2009   Method used:   Print then Give to Patient   RxID:   [id number] TRAMADOL HCL 50 MG TABS (TRAMADOL HCL) 1 q 6 hrs as needed pain  #30 x 2   Entered and Authorized by:   Marga Melnick MD   Signed by:   Marga Melnick MD on 10/11/2009   Method used:   Print then Give to Patient   RxID:   [id number] CYCLOBENZAPRINE HCL 5 MG TABS (CYCLOBENZAPRINE HCL) 1-2 at bedtime as needed  #20 x 0   Entered and Authorized by:   Marga Melnick MD   Signed by:   Marga Melnick MD on 10/11/2009   Method used:   Print then Give to Patient   RxID:   [id number] BUPROPION HCL 150 MG XR24H-TAB (BUPROPION HCL) 1 once daily  #30 x 5    Entered and Authorized by:   Marga Melnick MD   Signed by:   Marga Melnick MD on 10/11/2009   Method used:   Print then Give to Patient   RxID:   [id number] SPIRONOLACTONE 25 MG TABS (SPIRONOLACTONE) 1 once daily as needed edema  #30 x 1   Entered and Authorized by:   Marga Melnick MD   Signed by:   Marga Melnick MD on 10/11/2009   Method used:   Print then Give to Patient   RxID:   [id number]

## 2010-06-17 NOTE — Letter (Signed)
Summary: Patient No Show/[facility]  Patient No Show/[facility]   Imported By: Lanelle Bal 01/13/2010 12:39:03  _____________________________________________________________________  External Attachment:    Type:   Image     Comment:   External Document

## 2010-06-17 NOTE — Letter (Signed)
   [place] [facility] [address] [place], [place]  [address] Phone: [phone]      May 23, 2009   [place] [address] [place], [place] [address]  RE:  LAB RESULTS  Dear  Mr. Deliz,  The following is an interpretation of your most recent lab tests.  Please take note of any instructions provided or changes to medications that have resulted from your lab work.  ELECTROLYTES:  Good - no changes needed  KIDNEY FUNCTION TESTS:  Good - no changes needed  LIVER FUNCTION TESTS:  Good - no changes needed  THYROID STUDIES:  Thyroid studies normal TSH: 0.81     DIABETIC STUDIES:  Excellent - no changes needed Blood Glucose: 83   HgbA1C: 5.6     CBC:  Abnormal - schedule a follow-up appointment  Sodium is now normal. Low white count & types of cells suggests recent viral process. 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 No Diabetes risk with A1c < 6.1%.Special cholesterol is pending.It was a pleasure to meet you. [employer]

## 2010-06-17 NOTE — Assessment & Plan Note (Signed)
Summary: new pt ov//ccm   Vital Signs:  Patient profile:   30 year old male Height:      70.25 inches Weight:      230.6 pounds BMI:     32.97 Temp:     98.5 degrees F oral Pulse rate:   72 / minute Resp:     14 per minute BP sitting:   114 / 78  (left arm) Cuff size:   large  Vitals Entered By: Shonna Chock (May 21, 2009 1:27 PM)  CC: New patient establish: CPX -not fasting , General Medical Evaluation Comments REVIEWED MED LIST, PATIENT AGREED DOSE AND INSTRUCTION CORRECT    CC:  New patient establish: CPX -not fasting  and General Medical Evaluation.  History of Present Illness: Craig Rosales is here for a physical; he had hypernatremia @ appt with Dr Aldean Ast 04/2009. Dr Aldean Ast felt high sodium was cause of stones; HCTZ was Rxed.  Preventive Screening-Counseling & Management  Alcohol-Tobacco     Smoking Status: quit  Caffeine-Diet-Exercise     Does Patient Exercise: no  Allergies (verified): No Known Drug Allergies  Past History:  Past Medical History:  HYPERTENSION (ICD-401.9)  HYPERLIPIDEMIA, MILD, HX OF (ICD-V13.8)  RENAL CALCULUS, HX OF (ICD-V13.01) MIGRAINE HEADACHE (ICD-346.90) ASTHMA (ICD-493.90)  Past Surgical History: Arthroscopy of kneee  2010  Tonsillectomy  Family History: Father: living, High Cholesterol, border-line DM, HTN, COPD. PGM: Breast Cancer, MI in 21s. Mother: Deceased-41, DM, HTN, High Cholesterol, MI X2,fungal meningitis,Cancer-? primary. M-Uncle: HTN, DM, Kidney Failure Siblings: 1 brother:Thyroid, obesity. 1sister: HTN,Anemia,High cholesterol  Social History: Occupation: CNA Single Former Smoker: smoked 2 years Alcohol use-no Regular exercise-no Smoking Status:  quit Does Patient Exercise:  no  Review of Systems  The patient denies anorexia, fever, vision loss, decreased hearing, hoarseness, chest pain, syncope, dyspnea on exertion, peripheral edema, prolonged cough, headaches, hemoptysis, abdominal pain, melena,  hematochezia, severe indigestion/heartburn, suspicious skin lesions, depression, unusual weight change, abnormal bleeding, enlarged lymph nodes, and angioedema.         Weight loss of 49# with diet changes over 24 months.  Physical Exam  General:  well-nourished,in no acute distress; alert,appropriate and cooperative throughout examination Head:  Normocephalic and atraumatic without obvious abnormalities. Moustache Eyes:  No corneal or conjunctival inflammation noted.  Perrla. Funduscopic exam benign, without hemorrhages, exudates or papilledema. Ears:  External ear exam shows no significant lesions or deformities.  Otoscopic examination reveals clear canals, tympanic membranes are intact bilaterally without bulging, retraction, inflammation or discharge. Hearing is grossly normal bilaterally. Nose:  External nasal examination shows no deformity or inflammation. Nasal mucosa are dry without lesions or exudates. Mouth:  Oral mucosa and oropharynx without lesions or exudates.  Teeth in good repair. Neck:  No deformities, masses, or tenderness noted. Lungs:  Normal respiratory effort, chest expands symmetrically. Lungs are clear to auscultation, no crackles or wheezes. Heart:  Normal rate and regular rhythm. S1 and S2 normal without gallop, murmur, click, rub or other extra sounds. Abdomen:  Bowel sounds positive,abdomen soft and non-tender without masses, organomegaly or hernias noted. Genitalia:  Dr Aldean Ast Msk:  No deformity or scoliosis noted of thoracic or lumbar spine.   Pulses:  R and L carotid,radial,dorsalis pedis and posterior tibial pulses are full and equal bilaterally Extremities:  No clubbing, cyanosis, edema, or deformity noted with normal full range of motion of all joints.   Neurologic:  alert & oriented X3 and DTRs symmetrical and normal.   Skin:  Intact without suspicious lesions or rashes  Cervical Nodes:  No lymphadenopathy noted Axillary Nodes:  No palpable  lymphadenopathy Psych:  memory intact for recent and remote, normally interactive, and good eye contact.     Impression & Recommendations:  Problem # 1:  PREVENTIVE HEALTH CARE (ICD-V70.0)  Orders: EKG w/ Interpretation (93000) Venipuncture (47425) TLB-BMP (Basic Metabolic Panel-BMET) (80048-METABOL) TLB-CBC Platelet - w/Differential (85025-CBCD) TLB-Hepatic/Liver Function Pnl (80076-HEPATIC) TLB-TSH (Thyroid Stimulating Hormone) (84443-TSH) T-NMR, Lipoprofile (95638-75643) TLB-A1C / Hgb A1C (Glycohemoglobin) (83036-A1C)  Problem # 2:  Hx of HYPERTENSION (ICD-401.9)  His updated medication list for this problem includes:    Hydrochlorothiazide 25 Mg Tabs (Hydrochlorothiazide) .Marland Kitchen... 1 by mouth once daily    Diuretic Tabs (Buchu-cornsilk-ch grass-hydran) .Marland Kitchen... 1 by mouth once daily  Orders: EKG w/ Interpretation (93000) Venipuncture (32951)  Problem # 3:  Hx of HYPERLIPIDEMIA, MILD, HX OF (ICD-V13.8)  never treated  Orders: Venipuncture (88416) T-NMR, Lipoprofile (60630-16010) TLB-A1C / Hgb A1C (Glycohemoglobin) (83036-A1C)  Problem # 4:  Hx of RENAL CALCULUS, HX OF (ICD-V13.01)  asper Dr Aldean Ast  Orders: Venipuncture (93235)  Problem # 5:  DIABETES MELLITUS, FAMILY HX (ICD-V18.0)  Orders: Venipuncture (57322) TLB-A1C / Hgb A1C (Glycohemoglobin) (83036-A1C)  Problem # 6:  MYOCARDIAL INFARCTION, PREMATURE, FAMILY HX (ICD-V17.3)  Orders: EKG w/ Interpretation (93000) Venipuncture (02542) T-NMR, Lipoprofile (70623-76283)  Complete Medication List: 1)  Hydrochlorothiazide 25 Mg Tabs (Hydrochlorothiazide) .Marland Kitchen.. 1 by mouth once daily 2)  Diuretic Tabs (Buchu-cornsilk-ch grass-hydran) .Marland Kitchen.. 1 by mouth once daily 3)  Vitamin C 500 Mg Tabs (Ascorbic acid) .Marland Kitchen.. 1 by mouth once daily 4)  Zinc 50 Mg Tabs (Zinc) .Marland Kitchen.. 1 by mouth once daily 5)  Super Energy Herbal Complex Tabs (Misc natural products) .Marland Kitchen.. 1 by mouth once daily 6)  Garlic Oil 1000 Mg Caps (Garlic) .Marland Kitchen.. 1  by mouth once daily 7)  Fish Oil 1200 Mg Caps (Omega-3 fatty acids) .Marland Kitchen.. 1 by mouth once daily 8)  Sm Coral Calcium 1000 (390 Ca) Mg Tabs (Coral calcium) .Marland Kitchen.. 1 by mouth once daily 9)  Ginkoba 40 Mg Tabs (Ginkgo biloba) .Marland Kitchen.. 1 by mouth once daily 10)  Ginger Root 550 Mg Caps (Ginger (zingiber officinalis)) .Marland Kitchen.. 1 by mouth once daily  Other Orders: Tdap => 21yrs IM (15176) Admin 1st Vaccine (16073)  Patient Instructions: 1)  Recommendations will follow review of lab results.   Immunizations Administered:  Tetanus Vaccine:    Vaccine Type: Tdap    Site: right deltoid    Mfr: Sanofi Pasteur    Dose: 0.5 ml    Route: IM    Given by: Shonna Chock    Exp. Date: 11/22/2010    Lot #: [id number]    VIS given: 04/05/07 version given May 21, 2009.

## 2010-06-17 NOTE — Letter (Signed)
Summary: [facility]   Imported By: Lanelle Bal 10/15/2009 14:05:15  _____________________________________________________________________  External Attachment:    Type:   Image     Comment:   External Document

## 2010-06-19 NOTE — Assessment & Plan Note (Signed)
Summary: hosp followup---infection and pneumonia///sph   Vital Signs:  Patient profile:   30 year old male Weight:      251.6 pounds Temp:     98.9 degrees F oral Pulse rate:   92 / minute Resp:     15 per minute BP sitting:   126 / 80  (left arm) Cuff size:   large  Vitals Entered By: Shonna Chock CMA (May 21, 2010 4:02 PM) CC: Hospital Follow-up   CC:  Hospital Follow-up.  History of Present Illness:    Mr. Enriquez was hospitalized 12/14-12/18/2011 for Lumbar fusion @ L4-5 for Spinal Stenosis. He was  seen in  [facility]  12/30 with chest pain & SOB. D-dimer was 0.92 but CT scan was negative. Non fasting glucose was 167. He was  released ,but he was admitted to  [facility] 05/17/10 -05/20/2010 with PNA , UTI ,  & "almost septic with dehydration". D/C meds reviewed ; no antibiotics on list . He has been  on Lamisil for fungal skin infection since 12/10.(Note: [place] records NOT available ; [facility] records reviewed).  Current Medications (verified): 1)  Vitamin C 500 Mg Tabs (Ascorbic Acid) .Marland Kitchen.. 1 By Mouth Once Daily 2)  Gabapentin 300 Mg Caps (Gabapentin) .Marland Kitchen.. 1 By Mouth Once Daily 3)  Spironolactone 25 Mg Tabs (Spironolactone) .Marland Kitchen.. 1 Once Daily As Needed Edema 4)  Bupropion Hcl 150 Mg Xr24h-Tab (Bupropion Hcl) .Marland Kitchen.. 1 Once Daily 5)  Ranitidine Hcl 150 Mg Tabs (Ranitidine Hcl) .Marland Kitchen.. 1 Pill Every 12 Hrs Pre Meal 6)  Percocet 5-325 Mg Tabs (Oxycodone-Acetaminophen) .Marland Kitchen.. 1 By Mouth Every 6-8 Hours As Needed (Rx'ed in Emergency Room) 7)  Flexeril 10 Mg Tabs (Cyclobenzaprine Hcl) .Marland Kitchen.. 1 By Mouth Three Times A Day As Needed 8)  Lamisil 250 Mg Tabs (Terbinafine Hcl) .Marland Kitchen.. 1 By Mouth Once Daily 9)  Percocet 5-325 Mg Tabs (Oxycodone-Acetaminophen) .Marland Kitchen.. 1 By Mouth Every 6 Hours As Needed 10)  Colace 100 Mg Caps (Docusate Sodium) .Marland Kitchen.. 1 By Mouth Three Times A Day 11)  Trazodone  Allergies (verified): No Known Drug Allergies  Past History:  Past Medical History:  HYPERTENSION  (ICD-401.9)  HYPERLIPIDEMIA, MILD, HX OF (ICD-V13.8): LDL 129 (1293/425) ,HDL 48,TG 27(verified). 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 ENT:  No frontal headache , facial pain or purulence. Resp:  Complains of cough; denies chest pain with inspiration, coughing up blood, and shortness of breath. GI:  Complains of constipation; denies diarrhea; Pain meds caused constipation. GU:  Denies discharge, dysuria, and hematuria. MS:  Denies joint pain, joint redness, and joint swelling. Derm:  No pustules  or purulence from the op site.  Physical Exam  General:  well-nourished,in no acute distress; alert,appropriate and cooperative throughout examination Eyes:  No corneal or conjunctival inflammation or vascular changes  noted.  Perrla.No icterus.  Ears:  External ear exam shows no significant lesions or deformities.  Otoscopic examination reveals  wax > on L than R Nose:  External nasal examination shows no deformity or inflammation. Nasal mucosa are pink and moist without lesions or exudates. Mouth:  Oral mucosa and oropharynx without lesions or exudates.  Teeth in good repair. Lungs:  Normal respiratory effort, chest expands symmetrically.  Lungs are clear to auscultation, no crackles or wheezes. Heart:  Normal rate and regular rhythm. S1 and S2 normal without gallop, murmur, click, rub .S4 Abdomen:  Bowel sounds positive,abdomen soft and non-tender without masses, organomegaly or hernias noted. Extremities:  No clubbing, cyanosis,  or splinter hemorrhages Neurologic:  alert & oriented X3.   Skin:  Small op scar L LS area well healed;serpinginous , dry, flat  rash LUE  Cervical Nodes:  No lymphadenopathy  noted Axillary Nodes:  No palpable lymphadenopathy Psych:  memory intact for recent and remote, normally interactive, and good eye contact.     Impression & Recommendations:  Problem # 1:  FEVER (ICD-780.60)  with chills, sweats  since D/C from hospital  Problem # 2:  UTI (ICD-599.0) recent PMH of Orders: UA Dipstick W/ Micro (manual) (21308) T-Culture, Urine (65784-69629) Specimen Handling (99000)  Problem # 3:  PNEUMONIA DUE TO OTHER SPECIFIED BACTERIA (ICD-482.89) recent PMH of  Problem # 4:  FUNGAL DERMATITIS (ICD-111.9)  His updated medication list for this problem includes:    Lamisil 250 Mg Tabs (Terbinafine hcl) .Marland Kitchen... 1 by mouth once daily  Problem # 5:  DEHYDRATION (ICD-276.51)   recent PMH of  Complete Medication List: 1)  Vitamin C 500 Mg Tabs (Ascorbic acid) .Marland Kitchen.. 1 by mouth once daily 2)  Gabapentin 300 Mg Caps (Gabapentin) .Marland Kitchen.. 1 by mouth once daily 3)  Spironolactone 25 Mg Tabs (Spironolactone) .Marland Kitchen.. 1 once daily as needed edema 4)  Bupropion Hcl 150 Mg Xr24h-tab (Bupropion hcl) .Marland Kitchen.. 1 once daily 5)  Ranitidine Hcl 150 Mg Tabs (Ranitidine hcl) .Marland Kitchen.. 1 pill every 12 hrs pre meal 6)  Percocet 5-325 Mg Tabs (Oxycodone-acetaminophen) .Marland Kitchen.. 1 by mouth every 6-8 hours as needed (rx'ed in emergency room) 7)  Flexeril 10 Mg Tabs (Cyclobenzaprine hcl) .Marland Kitchen.. 1 by mouth three times a day as needed 8)  Lamisil 250 Mg Tabs (Terbinafine hcl) .Marland Kitchen.. 1 by mouth once daily 9)  Percocet 5-325 Mg Tabs (Oxycodone-acetaminophen) .Marland Kitchen.. 1 by mouth every 6 hours as needed 10)  Colace 100 Mg Caps (Docusate sodium) .Marland Kitchen.. 1 by mouth three times a day 11)  Trazodone   Patient Instructions: 1)  Fasting labs in am; see Diagnoses for Codes: 2)  BMP ; 3)  Hepatic Panel ; 4)  CBC w/ Diff . Prescriptions: GABAPENTIN 300 MG CAPS (GABAPENTIN) 1 by mouth once daily  #90 x 0   Entered by:   Shonna Chock CMA   Authorized by:   Marga Melnick MD   Signed by:   Shonna Chock CMA on 05/21/2010    Method used:   Print then Give to Patient   RxID:   [id number]    Orders Added: 1)  Est. Patient Level IV [36644] 2)  UA Dipstick W/ Micro (manual) [81000] 3)  T-Culture, Urine [[id number]] 4)  Specimen Handling [99000]    Laboratory Results   Urine Tests    Routine Urinalysis   Color: yellow Appearance: Clear Glucose: negative   (Normal Range: Negative) Bilirubin: negative   (Normal Range: Negative) Ketone: negative   (Normal Range: Negative) Spec. Gravity: <1.005   (Normal Range: 1.003-1.035) Blood: large   (Normal Range: Negative) pH: 8.5   (Normal Range: 5.0-8.0) Protein: negative   (Normal Range: Negative) Urobilinogen: 0.2   (Normal Range: 0-1) Nitrite: negative   (Normal Range: Negative) Leukocyte Esterace: moderate   (Normal Range: Negative)    Comments: sent for culture

## 2010-06-19 NOTE — Assessment & Plan Note (Signed)
History of Present Illness Visit Type: Initial Visit Primary GI MD: Rob Bunting MD Primary Provider: Oren Bracket Requesting Provider: n/a Chief Complaint: rectal Bleeding with painful BM's History of Present Illness:     very pleasant 30 year old man   he has been  bothered by constipation since disc proceudre. Takes colace three times a day.  Pain with BM, red blood in stool.    Overall weight up 40 pounds.  CBC earlier this month shows that he is not anemic  takes percocet 4 a day, was taking up to 5-6 times a day.  He has needed those pain meds since the back surgery.  Has been on percs this past september.  L4/L5.  has tried MOM to help his bowels move easier.  Never tried fiber or miralax.            Current Medications (verified): 1)  Vitamin C 500 Mg Tabs (Ascorbic Acid) .Marland Kitchen.. 1 By Mouth Once Daily 2)  Gabapentin 300 Mg Caps (Gabapentin) .Marland Kitchen.. 1 By Mouth Once Daily 3)  Spironolactone 25 Mg Tabs (Spironolactone) .Marland Kitchen.. 1 Once Daily As Needed Edema 4)  Bupropion Hcl 150 Mg Xr24h-Tab (Bupropion Hcl) .Marland Kitchen.. 1 Once Daily 5)  Ranitidine Hcl 150 Mg Tabs (Ranitidine Hcl) .Marland Kitchen.. 1 Pill Every 12 Hrs Pre Meal 6)  Percocet 5-325 Mg Tabs (Oxycodone-Acetaminophen) .Marland Kitchen.. 1 By Mouth Every 6-8 Hours As Needed (Rx'ed in Emergency Room) 7)  Flexeril 10 Mg Tabs (Cyclobenzaprine Hcl) .Marland Kitchen.. 1 By Mouth Three Times A Day As Needed 8)  Percocet 5-325 Mg Tabs (Oxycodone-Acetaminophen) .Marland Kitchen.. 1 By Mouth Every 6 Hours As Needed 9)  Colace 100 Mg Caps (Docusate Sodium) .Marland Kitchen.. 1 By Mouth Three Times A Day 10)  Proventil Hfa 108 (90 Base) Mcg/act Aers (Albuterol Sulfate) .... 2 Puffs Every 6 Hours As Needed 11)  Duoneb 0.5-2.5 (3) Mg/65ml Soln (Ipratropium-Albuterol) .... Every 6 Hours As Needed 12)  Symbicort 160-4.5 Mcg/act Aero (Budesonide-Formoterol Fumarate) .Marland Kitchen.. 1-2 Puffs Every 12 Hrs As Needed ; Gargle & Spit After Use  Allergies (verified): No Known Drug Allergies  Past History:  Past  Medical History:  HYPERTENSION (ICD-401.9)  HYPERLIPIDEMIA, MILD, HX OF (ICD-V13.8): LDL 129 (1293/425) ,HDL 48,TG 27(verified). 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 PGM: Breast Cancer, MI in 54s. Mother: Deceased-41, DM, HTN, High Cholesterol, MI X2,fungal meningitis,Cancer-? primary. M-Uncle: HTN, DM, Kidney Failure Siblings: 1 brother:Thyroid, obesity. 1sister: HTN,Anemia,High cholesterol no colon cancer  Social History: Occupation: CNA Single Former Smoker: smoked 2 years Alcohol use-no Regular exercise-no   One daughter  Review of Systems       Pertinent positive and negative review of systems were noted in the above HPI and GI specific review of systems.  All other review of systems was otherwise negative.   Vital Signs:  Patient profile:   30 year old male Height:      70.25 inches Weight:      252 pounds BMI:     36.03 BSA:     2.31 Pulse rate:   100 / minute Pulse rhythm:   regular BP sitting:   122 / 82  (left arm)  Vitals Entered By: Merri Ray CMA Duncan Dull) (June 06, 2010 2:19 PM)  Physical Exam  Additional Exam:  Constitutional: Obese, otherwise well-appearing Psychiatric: alert and oriented times 3 Eyes: extraocular movements intact Mouth: oropharynx moist, no lesions Neck: supple, no lymphadenopathy Cardiovascular: heart regular rate and rythm Lungs: CTA bilaterally Abdomen: soft, non-tender, non-distended, no obvious ascites, no peritoneal signs, normal bowel sounds Extremities: no lower extremity edema bilaterally Skin: no lesions on visible extremities rectal examination: deep buttocks crevice, difficult to visualize anus however  there appeared to be a midline, anal fissure that was quite tender to touch. Stool is brown.   Impression & Recommendations:  Problem # 1:  anal fissure, likely from constipation do to narcotic pain medicine cutting back on narcotic pain medicine will help his constipation. I don't think Colace is a very effective therapy for constipation especially narcotic induced constipation time having him stop the Colace and he will start taking 2 scoops of MiraLax powder in water every day. Sitz baths twice daily and I have given him a prescription for anamantle cream to apply to his backside once daily. He'll return to my office in 5-6 weeks.  Patient Instructions: 1)  Stop the colace 2)  Start OTC miralax, 2 doses every morning.  This will help your constipation. 3)  Start applying prescription anal ointment once daily. 4)  Start doing Mining engineer (purchase at pharmacy) twice daily 20 min, warm water. 5)  Cutting back on narcotic pain meds will help improve constipation. 6)  Return to see Dr. Christella Hartigan in 5-6 weeks. 7)  The medication list was reviewed and reconciled.  All changed / newly prescribed medications were explained.  A complete medication list was provided to the patient / caregiver. Prescriptions: HYDROCORTISONE ACE-PRAMOXINE 1-1 % CREA (HYDROCORTISONE ACE-PRAMOXINE) apply once daily to anus  #1 month x 3   Entered and Authorized by:   Rachael Fee MD   Signed by:   Rachael Fee MD on 06/06/2010   Method used:   Print then Give to Patient   RxID:   [id number]

## 2010-06-19 NOTE — Assessment & Plan Note (Signed)
Summary: hosp followup--ok per Chrae---chest pains--dischrgd 1/10///sph   Vital Signs:  Patient profile:   30 year old male Weight:      255 pounds BMI:     36.46 Temp:     98.7 degrees F oral Pulse rate:   80 / minute Resp:     15 per minute BP sitting:   118 / 84  (left arm) Cuff size:   large  Vitals Entered By: Shonna Chock CMA (June 06, 2010 11:51 AM) CC: Hospital Follow-up Craig Rosales), COPD follow-up   CC:  Hospital Follow-up Craig Rosales) and COPD follow-up.  History of Present Illness:    D/C Summary 05/26/2010 was reviewed  & discussed with him.  No further chest pain since D/C. GI appt today with Dr Jarold Motto  for rectal bleeding .Since D/C he  reports exercise induced symptoms, but denies shortness of breath, chest tightness, wheezing, cough, increased sputum, and nocturnal awakening.  Current treatment includes home nebulizer with Duo-Neb 3X/ day  &  MDI with spaceron average once daily , and oral steroids. Pathophysiology of RAD ( inflammation) discussed; controller Rxed.   Current Medications (verified): 1)  Vitamin C 500 Mg Tabs (Ascorbic Acid) .Marland Kitchen.. 1 By Mouth Once Daily 2)  Gabapentin 300 Mg Caps (Gabapentin) .Marland Kitchen.. 1 By Mouth Once Daily 3)  Spironolactone 25 Mg Tabs (Spironolactone) .Marland Kitchen.. 1 Once Daily As Needed Edema 4)  Bupropion Hcl 150 Mg Xr24h-Tab (Bupropion Hcl) .Marland Kitchen.. 1 Once Daily 5)  Ranitidine Hcl 150 Mg Tabs (Ranitidine Hcl) .Marland Kitchen.. 1 Pill Every 12 Hrs Pre Meal 6)  Percocet 5-325 Mg Tabs (Oxycodone-Acetaminophen) .Marland Kitchen.. 1 By Mouth Every 6-8 Hours As Needed (Rx'ed in Emergency Room) 7)  Flexeril 10 Mg Tabs (Cyclobenzaprine Hcl) .Marland Kitchen.. 1 By Mouth Three Times A Day As Needed 8)  Percocet 5-325 Mg Tabs (Oxycodone-Acetaminophen) .Marland Kitchen.. 1 By Mouth Every 6 Hours As Needed 9)  Colace 100 Mg Caps (Docusate Sodium) .Marland Kitchen.. 1 By Mouth Three Times A Day 10)  Proventil Hfa 108 (90 Base) Mcg/act Aers (Albuterol Sulfate) .... 2 Puffs Every 6 Hours As Needed 11)  Duoneb 0.5-2.5 (3) Mg/42ml Soln  (Ipratropium-Albuterol) .... Every 6 Hours As Needed  Allergies (verified): No Known Drug Allergies  Physical Exam  General:  well-nourished,in no acute distress; alert,appropriate and cooperative throughout examination Lungs:  Normal respiratory effort, chest expands symmetrically. Lungs are clear to auscultation, no crackles or wheezes. Heart:  Normal rate and regular rhythm. S1 and S2 normal without gallop, murmur, click, rub . Pulses:  R and L carotid,radial,femoral,dorsalis pedis and posterior tibial pulses are full and equal bilaterally Extremities:  No clubbing, cyanosis, edema.  Homan's negative. Skin:  Intact without suspicious lesions or rashes Cervical Nodes:  No lymphadenopathy noted Axillary Nodes:  No palpable lymphadenopathy Psych:  memory intact for recent and remote, normally interactive, and good eye contact.     Impression & Recommendations:  Problem # 1:  ASTHMA (ICD-493.90) recent exacerbation;now mainly EIB. A  controller indicated, only on smooth muscles relaxants His updated medication list for this problem includes:    Proventil Hfa 108 (90 Base) Mcg/act Aers (Albuterol sulfate) .Marland Kitchen... 2 puffs every 6 hours as needed    Duoneb 0.5-2.5 (3) Mg/90ml Soln (Ipratropium-albuterol) ..... Every 6 hours as needed    Symbicort 160-4.5 Mcg/act Aero (Budesonide-formoterol fumarate) .Marland Kitchen... 1-2 puffs every 12 hrs as needed ; gargle & spit after use  Problem # 2:  HYPERGLYCEMIA, FASTING (ICD-790.29) diagnosis of DM in him  not supported by A1c." Dr Betti Cruz @[facility]  diagnosed  DM"  Complete Medication List: 1)  Vitamin C 500 Mg Tabs (Ascorbic acid) .Marland Kitchen.. 1 by mouth once daily 2)  Gabapentin 300 Mg Caps (Gabapentin) .Marland Kitchen.. 1 by mouth once daily 3)  Spironolactone 25 Mg Tabs (Spironolactone) .Marland Kitchen.. 1 once daily as needed edema 4)  Bupropion Hcl 150 Mg Xr24h-tab (Bupropion hcl) .Marland Kitchen.. 1 once daily 5)  Ranitidine Hcl 150 Mg Tabs (Ranitidine hcl) .Marland Kitchen.. 1 pill every 12 hrs pre meal 6)  Percocet  5-325 Mg Tabs (Oxycodone-acetaminophen) .Marland Kitchen.. 1 by mouth every 6-8 hours as needed (rx'ed in emergency room) 7)  Flexeril 10 Mg Tabs (Cyclobenzaprine hcl) .Marland Kitchen.. 1 by mouth three times a day as needed 8)  Percocet 5-325 Mg Tabs (Oxycodone-acetaminophen) .Marland Kitchen.. 1 by mouth every 6 hours as needed 9)  Colace 100 Mg Caps (Docusate sodium) .Marland Kitchen.. 1 by mouth three times a day 10)  Proventil Hfa 108 (90 Base) Mcg/act Aers (Albuterol sulfate) .... 2 puffs every 6 hours as needed 11)  Duoneb 0.5-2.5 (3) Mg/73ml Soln (Ipratropium-albuterol) .... Every 6 hours as needed 12)  Symbicort 160-4.5 Mcg/act Aero (Budesonide-formoterol fumarate) .Marland Kitchen.. 1-2 puffs every 12 hrs as needed ; gargle & spit after use  Patient Instructions: 1)  Please schedule a follow-up  A1c  in 2 months. 2)  (790.29) Prescriptions: SYMBICORT 160-4.5 MCG/ACT AERO (BUDESONIDE-FORMOTEROL FUMARATE) 1-2 puffs every 12 hrs as needed ; gargle & spit after use  #1 x 11   Entered and Authorized by:   Marga Melnick MD   Signed by:   Marga Melnick MD on 06/06/2010   Method used:   Print then Give to Patient   RxID:   [id number]    Orders Added: 1)  Est. Patient Level III [30865]

## 2010-06-19 NOTE — Letter (Signed)
Summary: New Patient letter  [facility]  [address] [place], [place] [address]   Phone: [phone]  Fax: [phone]       06/03/2010 MRN: [id number]  [facility] [address] [place], [place]  [address]  Dear Craig Rosales,  Welcome to the Gastroenterology Division at [facility].    You are scheduled to see Dr.  Rob Bunting on Janaury 20, 2012 at 3:00pm on the 3rd floor at [facility], [address]  We ask that you try to arrive at our office 15 minutes prior to your appointment time to allow for check-in.  We would like you to complete the enclosed self-administered evaluation form prior to your visit and bring it with you on the day of your appointment.  We will review it with you.  Also, please bring a complete list of all your medications or, if you prefer, bring the medication bottles and we will list them.  Please bring your insurance card so that we may make a copy of it.  If your insurance requires a referral to see a specialist, please bring your referral form from your primary care physician.  Co-payments are due at the time of your visit and may be paid by cash, check or credit card.     Your office visit will consist of a consult with your physician (includes a physical exam), any laboratory testing he/she may order, scheduling of any necessary diagnostic testing (e.g. x-ray, ultrasound, CT-scan), and scheduling of a procedure (e.g. Endoscopy, Colonoscopy) if required.  Please allow enough time on your schedule to allow for any/all of these possibilities.    If you cannot keep your appointment, please call [phone] to cancel or reschedule prior to your appointment date.  This allows [place] the opportunity to schedule an appointment for another patient in need of care.  If you do not cancel or reschedule by 5 p.m. the business day prior to your appointment date, you will be charged a $50.00 late cancellation/no-show fee.     Thank you for choosing  Gastroenterology for your medical needs.  We appreciate the opportunity to care for you.  Please visit [place] at our website  to learn more about our practice.                     Sincerely,                                                             The Gastroenterology Division

## 2010-06-19 NOTE — Progress Notes (Signed)
Summary: Results  Call back at [facility] Phone [phone]   Summary of Call: Patient called for test results. Please advise. Initial call taken by: Lucious Groves CMA,  May 23, 2010 10:59 AM     Appended Document: Results all normal except for minor elevation in 2 liver enzymes; recheck fasting AST & ALT in 1 week because of the antifungal medication(790.4).  Appended Document: Results mailed.  Appended Document: Results    Phone Note Outgoing Call   Details for Reason: all normal except for minor elevation in 2 liver enzymes; recheck fasting AST & ALT in 1 week because of the antifungal medication(790.4). Summary of Call: Left message on voicemail to call back to office. Lucious Groves CMA  May 26, 2010 9:02 AM   Patient notified and states that he is currently in [facility]. He was re-admitted on Jan 7th. He will call back for lab appt. Lucious Groves CMA  May 26, 2010 9:12 AM

## 2010-06-19 NOTE — Letter (Signed)
Summary: [facility]   Imported By: Lanelle Bal 05/30/2010 12:47:46  _____________________________________________________________________  External Attachment:    Type:   Image     Comment:   External Document

## 2010-07-01 ENCOUNTER — Other Ambulatory Visit: Payer: Self-pay | Admitting: Neurological Surgery

## 2010-07-01 ENCOUNTER — Ambulatory Visit
Admission: RE | Admit: 2010-07-01 | Discharge: 2010-07-01 | Disposition: A | Discharge status: Home / Self Care | Payer: PRIVATE HEALTH INSURANCE | Source: Ambulatory Visit | Attending: Neurological Surgery | Admitting: Neurological Surgery

## 2010-07-01 DIAGNOSIS — M545 Low back pain: Secondary | ICD-10-CM

## 2010-07-14 ENCOUNTER — Encounter: Payer: Self-pay | Admitting: Internal Medicine

## 2010-07-21 ENCOUNTER — Ambulatory Visit: Payer: Self-pay | Admitting: Gastroenterology

## 2010-07-21 DIAGNOSIS — R69 Illness, unspecified: Secondary | ICD-10-CM

## 2010-07-28 LAB — D-DIMER, QUANTITATIVE: D-Dimer, Quant: 0.92 ug/mL-FEU — ABNORMAL HIGH (ref 0.00–0.48)

## 2010-07-28 LAB — BASIC METABOLIC PANEL
BUN: 14 mg/dL (ref 6–23)
BUN: 7 mg/dL (ref 6–23)
Calcium: 8.6 mg/dL (ref 8.4–10.5)
Chloride: 100 mEq/L (ref 96–112)
Chloride: 101 mEq/L (ref 96–112)
Creatinine, Ser: 0.93 mg/dL (ref 0.4–1.5)
GFR calc Af Amer: >60 mL/min (ref >60)
GFR calc Af Amer: >60 mL/min (ref >60)
GFR calc non Af Amer: >60 mL/min (ref >60)
GFR calc non Af Amer: >60 mL/min (ref >60)
Potassium: 4.1 mEq/L (ref 3.5–5.1)
Sodium: 136 mEq/L (ref 135–145)

## 2010-07-28 LAB — DIFFERENTIAL
Basophils Absolute: 0 10*3/uL (ref 0.0–0.1)
Basophils Relative: 0 % (ref 0–1)
Eosinophils Absolute: 0 10*3/uL (ref 0.0–0.7)
Eosinophils Relative: 1 % (ref 0–5)
Lymphocytes Relative: 36 % (ref 12–46)
Lymphs Abs: 2.3 10*3/uL (ref 0.7–4.0)
Monocytes Absolute: 0.5 10*3/uL (ref 0.1–1.0)
Monocytes Relative: 7 % (ref 3–12)
Neutro Abs: 3.6 10*3/uL (ref 1.7–7.7)
Neutrophils Relative %: 56 % (ref 43–77)

## 2010-07-28 LAB — CBC
HCT: 40.9 % (ref 39.0–52.0)
HCT: 41.1 % (ref 39.0–52.0)
Hemoglobin: 13.7 g/dL (ref 13.0–17.0)
Hemoglobin: 13.8 g/dL (ref 13.0–17.0)
MCH: 29.1 pg (ref 26.0–34.0)
MCH: 29.2 pg (ref 26.0–34.0)
MCHC: 33.5 g/dL (ref 30.0–36.0)
MCHC: 33.6 g/dL (ref 30.0–36.0)
MCV: 87 fL (ref 78.0–100.0)
MCV: 87.1 fL (ref 78.0–100.0)
Platelets: 154 10*3/uL (ref 150–400)
Platelets: 203 10*3/uL (ref 150–400)
RBC: 4.7 MIL/uL (ref 4.22–5.81)
RBC: 4.72 MIL/uL (ref 4.22–5.81)
RDW: 12.6 % (ref 11.5–15.5)
RDW: 12.9 % (ref 11.5–15.5)
WBC: 6.4 10*3/uL (ref 4.0–10.5)
WBC: 8.4 10*3/uL (ref 4.0–10.5)

## 2010-07-28 LAB — GLUCOSE, CAPILLARY: Glucose-Capillary: 92 mg/dL (ref 70–99)

## 2010-07-28 LAB — POCT CARDIAC MARKERS
CKMB, poc: <1 ng/mL — ABNORMAL LOW (ref 1.0–8.0)
Myoglobin, poc: 22.3 ng/mL (ref 12–200)
Troponin i, poc: <0.05 ng/mL (ref 0.00–0.09)

## 2010-07-29 LAB — DIFFERENTIAL
Basophils Absolute: 0 10*3/uL (ref 0.0–0.1)
Basophils Relative: 0 % (ref 0–1)
Neutro Abs: 4.2 10*3/uL (ref 1.7–7.7)
Neutrophils Relative %: 60 % (ref 43–77)

## 2010-07-29 LAB — CBC
MCHC: 33 g/dL (ref 30.0–36.0)
RDW: 12.6 % (ref 11.5–15.5)
WBC: 6.9 10*3/uL (ref 4.0–10.5)

## 2010-07-29 LAB — BASIC METABOLIC PANEL
BUN: 15 mg/dL (ref 6–23)
Calcium: 9.6 mg/dL (ref 8.4–10.5)
GFR calc non Af Amer: >60 mL/min (ref >60)
Glucose, Bld: 93 mg/dL (ref 70–99)
Sodium: 141 mEq/L (ref 135–145)

## 2010-07-29 LAB — ABO/RH: ABO/RH(D): A POS

## 2010-07-29 LAB — PROTIME-INR
INR: 0.92 (ref 0.00–1.49)
Prothrombin Time: 12.6 seconds (ref 11.6–15.2)

## 2010-07-29 LAB — APTT: aPTT: 25 seconds (ref 24–37)

## 2010-08-04 ENCOUNTER — Encounter (INDEPENDENT_AMBULATORY_CARE_PROVIDER_SITE_OTHER): Payer: Self-pay | Admitting: *Deleted

## 2010-08-04 ENCOUNTER — Other Ambulatory Visit: Payer: PRIVATE HEALTH INSURANCE

## 2010-08-04 ENCOUNTER — Other Ambulatory Visit: Payer: Self-pay | Admitting: Internal Medicine

## 2010-08-04 DIAGNOSIS — R7309 Other abnormal glucose: Secondary | ICD-10-CM

## 2010-08-05 ENCOUNTER — Other Ambulatory Visit: Payer: Self-pay

## 2010-08-19 ENCOUNTER — Emergency Department: Payer: Self-pay | Admitting: Emergency Medicine

## 2010-08-21 LAB — CBC
MCHC: 34 g/dL (ref 30.0–36.0)
RBC: 4.96 MIL/uL (ref 4.22–5.81)
RDW: 13.2 % (ref 11.5–15.5)

## 2010-08-21 LAB — DIFFERENTIAL
Basophils Absolute: 0 10*3/uL (ref 0.0–0.1)
Basophils Relative: 1 % (ref 0–1)
Neutro Abs: 3.5 10*3/uL (ref 1.7–7.7)
Neutrophils Relative %: 48 % (ref 43–77)

## 2010-08-21 LAB — URINALYSIS, ROUTINE W REFLEX MICROSCOPIC
Glucose, UA: NEGATIVE mg/dL
Protein, ur: NEGATIVE mg/dL
Specific Gravity, Urine: 1.022 (ref 1.005–1.030)

## 2010-08-21 LAB — BASIC METABOLIC PANEL
CO2: 25 mEq/L (ref 19–32)
Calcium: 9.1 mg/dL (ref 8.4–10.5)
Creatinine, Ser: 1.03 mg/dL (ref 0.4–1.5)
GFR calc Af Amer: >60 mL/min (ref >60)
Glucose, Bld: 133 mg/dL — ABNORMAL HIGH (ref 70–99)

## 2010-08-21 LAB — URINE MICROSCOPIC-ADD ON

## 2010-08-26 ENCOUNTER — Ambulatory Visit
Admission: RE | Admit: 2010-08-26 | Discharge: 2010-08-26 | Disposition: A | Discharge status: Home / Self Care | Payer: Medicaid Other | Source: Ambulatory Visit | Attending: Neurological Surgery | Admitting: Neurological Surgery

## 2010-08-26 ENCOUNTER — Other Ambulatory Visit: Payer: Self-pay | Admitting: Neurological Surgery

## 2010-08-26 DIAGNOSIS — M48061 Spinal stenosis, lumbar region without neurogenic claudication: Secondary | ICD-10-CM

## 2010-08-26 DIAGNOSIS — M5137 Other intervertebral disc degeneration, lumbosacral region: Secondary | ICD-10-CM

## 2010-09-10 ENCOUNTER — Encounter: Payer: Self-pay | Admitting: Internal Medicine

## 2010-09-10 ENCOUNTER — Ambulatory Visit (INDEPENDENT_AMBULATORY_CARE_PROVIDER_SITE_OTHER): Payer: Medicaid Other | Admitting: Internal Medicine

## 2010-09-10 DIAGNOSIS — R0989 Other specified symptoms and signs involving the circulatory and respiratory systems: Secondary | ICD-10-CM

## 2010-09-10 DIAGNOSIS — J45909 Unspecified asthma, uncomplicated: Secondary | ICD-10-CM

## 2010-09-10 DIAGNOSIS — Z8639 Personal history of other endocrine, nutritional and metabolic disease: Secondary | ICD-10-CM

## 2010-09-10 DIAGNOSIS — Z13220 Encounter for screening for lipoid disorders: Secondary | ICD-10-CM

## 2010-09-10 DIAGNOSIS — Z862 Personal history of diseases of the blood and blood-forming organs and certain disorders involving the immune mechanism: Secondary | ICD-10-CM

## 2010-09-10 DIAGNOSIS — R5381 Other malaise: Secondary | ICD-10-CM

## 2010-09-10 DIAGNOSIS — R0683 Snoring: Secondary | ICD-10-CM

## 2010-09-10 DIAGNOSIS — F329 Major depressive disorder, single episode, unspecified: Secondary | ICD-10-CM

## 2010-09-10 LAB — HEPATIC FUNCTION PANEL
AST: 16 U/L (ref 0–37)
Albumin: 3.8 g/dL (ref 3.5–5.2)
Alkaline Phosphatase: 60 U/L (ref 39–117)
Total Protein: 7.1 g/dL (ref 6.0–8.3)

## 2010-09-10 LAB — CBC WITH DIFFERENTIAL/PLATELET
Basophils Relative: 0.2 % (ref 0.0–3.0)
Eosinophils Relative: 0.4 % (ref 0.0–5.0)
HCT: 41.5 % (ref 39.0–52.0)
Hemoglobin: 14.1 g/dL (ref 13.0–17.0)
Lymphs Abs: 2.1 10*3/uL (ref 0.7–4.0)
MCV: 87.1 fl (ref 78.0–100.0)
Monocytes Absolute: 0.5 10*3/uL (ref 0.1–1.0)
RBC: 4.76 Mil/uL (ref 4.22–5.81)
WBC: 5.8 10*3/uL (ref 4.5–10.5)

## 2010-09-10 LAB — BASIC METABOLIC PANEL
Chloride: 102 mEq/L (ref 96–112)
Potassium: 3.9 mEq/L (ref 3.5–5.1)

## 2010-09-10 LAB — T4, FREE: Free T4: 0.78 ng/dL (ref 0.60–1.60)

## 2010-09-10 LAB — LIPID PANEL: VLDL: 7.4 mg/dL (ref 0.0–40.0)

## 2010-09-10 MED ORDER — FLUOXETINE HCL 20 MG PO CAPS: 20.0000 mg | ORAL_CAPSULE | Freq: Every day | ORAL | Status: AC

## 2010-09-10 NOTE — Progress Notes (Signed)
Subjective:    Patient ID: Craig Rosales, male    DOB: April 16, 1981, 30 y.o.   MRN: [id number]  HPI  He had scheduled a physical; but he has been in hospital 3x in past 5 months.                                                                             FATIGUE Onset: for 1 week  Fatigue @ rest : yes   ; with exertion fatigue  worse   Primarily motivational fatigue: no Primarily physical fatigue: yes Symptoms: Fever/ chills :  chills only  Night sweats: yes ;Vision changes ( blurred/ double/ loss): no                                                                                                                   Hoarseness or swallowing dysfunction: dysphagia with food with every meal X 3 days                                                                                       Bowel changes( constipation/ diarrhea): no                                                                                                                                                 Weight change: yes, weight up 13#   Exertional chest pain: no  Dyspnea on exertion: yes ; asthma controlled with Symbicort. Rescue still used once daily. Cough: yes, minor, non productive  Hemoptysis: no  New medications: yes, B2  Leg swelling: no  Orthopnea: yes, occasionally  PND: yes  Melena/ rectal bleeding: no  Adenopathy: no  Severe snoring: yes  Daytime sleepiness: yes  Skin / hair / nail changes: no  Temperature intolerance( heat/ cold) : yes; heat intolerance                                                                                                                      Feeling depressed: yes,Wellbutrin helped quick smoking but not depression  Anhedonia: no  Altered appetite: no  Poor sleep/ Apnea : yes, poor sleep intermittently ; no observed apnea. His wife describes loud breathing  Abnormal bruising / bleeding or enlarged lymph nodes: no                                                                                                                                   PMH/ FH of thyroid disease: mother had thyroid disease      Review of Systems in Rehab for residual back issues     Objective:   Physical Exam Gen.: Healthy and well-nourished in appearance. Alert, appropriate and cooperative throughout exam. Head: Normocephalic without obvious abnormalities Eyes: No corneal or conjunctival inflammation noted. Pupils equal round reactive to light and accommodation. Extraocular motion intact. No lid lag Mouth: Oral mucosa and oropharynx reveal no lesions or exudates. Teeth in good repair. Oropharynx crowded.Neck: No deformities, masses, or tenderness noted. Range of motion normal. Thyroid ; w/o nodules or enlargement. Lungs: Normal respiratory effort; chest expands symmetrically. Lungs are clear to auscultation without rales, wheezes, or increased work of breathing. Heart: Normal rate and rhythm. Normal S1 and S2. No gallop, click, or rub. S4 w/o  murmur. Abdomen: Bowel sounds normal; abdomen soft and nontender. No masses, organomegaly or hernias noted.Dullness RUQ to percussion. Musculoskeletal/extremities: No deformity or scoliosis noted of  the thoracic or lumbar spine. No clubbing, cyanosis, edema, or deformity noted. Range of motion  normal .Tone & strength  normal.Joints normal. Nail health  Good. No onycholysis Vascular: Carotid, radial artery, dorsalis pedis and dorsalis posterior tibial pulses are full and equal. No bruits present. Neurologic: Alert and oriented x3. Deep tendon reflexes symmetrical and normal.         Skin: Intact without suspicious lesions or rashes.Faint striae over abdomen Lymph: No cervical, axillary, or inguinal lymphadenopathy present. Psych: Mood and affect slightly flat.polite &  interactive  Assessment & Plan:  #1 fatigue ; multifactorial . R/O OSA   #2 ? Dysphagia  #3 asthma  controlled  #4 depression                                                                               Plan: see Orders& Recommendations

## 2010-09-10 NOTE — Progress Notes (Signed)
Addended by: Doristine Devoid on: 09/10/2010 12:37 PM   Modules accepted: Orders

## 2010-09-10 NOTE — Patient Instructions (Signed)
Please assess response to Fluoxetine 20 mg daily. Please keep Sleep referral appt.

## 2010-09-17 ENCOUNTER — Emergency Department: Payer: Self-pay | Admitting: Emergency Medicine

## 2010-09-30 ENCOUNTER — Encounter: Payer: Self-pay | Admitting: Pulmonary Disease

## 2010-10-01 ENCOUNTER — Encounter: Payer: Self-pay | Admitting: Pulmonary Disease

## 2010-10-01 ENCOUNTER — Ambulatory Visit (INDEPENDENT_AMBULATORY_CARE_PROVIDER_SITE_OTHER): Payer: Medicaid Other | Admitting: Pulmonary Disease

## 2010-10-01 VITALS — BP 114/72 | HR 95 | Temp 98.0°F | Ht 70.0 in | Wt 283.0 lb

## 2010-10-01 DIAGNOSIS — G4733 Obstructive sleep apnea (adult) (pediatric): Secondary | ICD-10-CM

## 2010-10-01 NOTE — Patient Instructions (Signed)
Will set you up to have a sleep study.  Will call when results are available  Work on weight loss

## 2010-10-01 NOTE — Progress Notes (Signed)
  Subjective:    Patient ID: Craig Rosales, male    DOB: Nov 03, 1980, 30 y.o.   MRN: [id number]  HPI The pt is a 30y/o male who I have been asked to see for possible osa.  His history is significant for the following: -loud snoring with abnormal breathing pattern noted. -nonrestorative sleep, dozing with periods of inactivity, sleep pressure with driving.   -weight up 60 pounds last 2 yrs, epworth score today 15.  Sleep Questionnaire: What time do you typically go to bed?( Between what hours) 10 pm to 11 pm How long does it take you to fall asleep? 1 hour, if not longer How many times during the night do you wake up? 2 What time do you get out of bed to start your day? 0700 Do you drive or operate heavy machinery in your occupation? No How much has your weight changed (up or down) over the past two years? (In pounds) 60 lb (27.216 kg) Have you ever had a sleep study before? No Do you currently use CPAP? No Do you wear oxygen at any time? No     Review of Systems  Constitutional: Positive for unexpected weight change. Negative for fever.  HENT: Positive for sneezing and dental problem. Negative for ear pain, nosebleeds, congestion, sore throat, rhinorrhea, trouble swallowing, postnasal drip and sinus pressure.   Eyes: Negative for redness and itching.  Respiratory: Negative for cough, chest tightness, shortness of breath and wheezing.   Cardiovascular: Negative for palpitations and leg swelling.  Gastrointestinal: Positive for abdominal pain. Negative for nausea and vomiting.  Genitourinary: Negative for dysuria.  Musculoskeletal: Positive for joint swelling.  Skin: Negative for rash.  Neurological: Positive for headaches.  Hematological: Does not bruise/bleed easily.  Psychiatric/Behavioral: Positive for dysphoric mood. The patient is nervous/anxious.        Objective:   Physical Exam Constitutional:  Well developed, no acute distress  HENT:  Nares with deviated septum to left  with obstruction, turbs increased bilat  Oropharynx without exudate, palate and uvula thick and elongated.  Narrow posterior pharyngeal space  Large tongue  Eyes:  Perrla, eomi, no scleral icterus  Neck:  No JVD, no TMG  Cardiovascular:  Normal rate, regular rhythm, no rubs or gallops.  No murmurs        Intact distal pulses  Pulmonary :  Normal breath sounds, no stridor or respiratory distress   No rales, rhonchi, or wheezing  Abdominal:  Soft, nondistended, bowel sounds present.  No tenderness noted.   Musculoskeletal:  No lower extremity edema noted.  Lymph Nodes:  No cervical lymphadenopathy noted  Skin:  No cyanosis noted  Neurologic:  Alert, appropriate, moves all 4 extremities without obvious deficit.         Assessment & Plan:

## 2010-10-01 NOTE — Assessment & Plan Note (Addendum)
The pt's history is very suspicious for sleep apnea, and I have reviewed the pathophysiology of sleep apnea with him in detail including its impact on CV health and DM.  He agrees to have a sleep study, and will arrange followup once the results are available.

## 2010-10-05 ENCOUNTER — Encounter: Payer: Self-pay | Admitting: Pulmonary Disease

## 2010-10-28 ENCOUNTER — Ambulatory Visit (HOSPITAL_BASED_OUTPATIENT_CLINIC_OR_DEPARTMENT_OTHER): Payer: Medicaid Other | Attending: Pulmonary Disease

## 2010-10-28 DIAGNOSIS — G4733 Obstructive sleep apnea (adult) (pediatric): Secondary | ICD-10-CM | POA: Insufficient documentation

## 2010-10-30 ENCOUNTER — Telehealth: Payer: Self-pay | Admitting: Pulmonary Disease

## 2010-10-30 NOTE — Telephone Encounter (Signed)
Called # provided x 2 - Received message stating the person you are trying to reach is unavailable.  Pls try your call again later.  Unable to leave message.  WCB.

## 2010-10-31 NOTE — Telephone Encounter (Signed)
LMTCB

## 2010-10-31 NOTE — Telephone Encounter (Signed)
Spoke with pt. He is requesting PSG results. I advised that I do not see where results are back yet, and KC out of the office until 11/03/10 so will forward him msg to let him know pt needing these results. Please advise thanks!

## 2010-11-03 NOTE — Telephone Encounter (Signed)
Pt aware. Pt had sleep study on 10-28-10,so I advised KC will be reading studies this week. Carron Curie, CMA

## 2010-11-03 NOTE — Telephone Encounter (Signed)
Let pt know I read all studies before I left on vacation June 8th.  If he had before that, we need to make sure sleep center has his study in my box.  If he had after, let him know that I will be reading studies this week.

## 2010-11-12 ENCOUNTER — Telehealth: Payer: Self-pay | Admitting: Pulmonary Disease

## 2010-11-12 DIAGNOSIS — E669 Obesity, unspecified: Secondary | ICD-10-CM | POA: Insufficient documentation

## 2010-11-12 NOTE — Telephone Encounter (Signed)
KC, have you read results yet? Please advise, thanks!

## 2010-11-12 NOTE — Telephone Encounter (Signed)
I called the pt today and let him know his sleep study was not scored properly.  I gave back to sleep tech to rescore.  Will discuss with him once study available for review with correct scoring.

## 2010-11-18 DIAGNOSIS — G4710 Hypersomnia, unspecified: Secondary | ICD-10-CM | POA: Insufficient documentation

## 2010-11-20 DIAGNOSIS — G4733 Obstructive sleep apnea (adult) (pediatric): Secondary | ICD-10-CM

## 2010-11-21 ENCOUNTER — Ambulatory Visit: Payer: Medicaid Other | Admitting: Pulmonary Disease

## 2010-11-21 NOTE — Procedures (Signed)
Craig Rosales, Craig Rosales           ACCOUNT NO.:  [id number]  MEDICAL RECORD NO.:  [id number]          PATIENT TYPE:  OUT  LOCATION:  SLEEP CENTER                 FACILITY:  [facility]  PHYSICIAN:  Barbaraann Share, MD,FCCPDATE OF BIRTH:  04/23/81  DATE OF STUDY:  10/28/2010                           NOCTURNAL POLYSOMNOGRAM  REFERRING PHYSICIAN:  Barbaraann Share, MD,FCCP  INDICATION FOR STUDY:  Hypersomnia with sleep apnea.  EPWORTH SLEEPINESS SCORE:  15.  MEDICATIONS:  SLEEP ARCHITECTURE:  The patient had a total sleep time of 276 minutes with adequate slow wave sleep for age, but decreased quantity of REM. Sleep-onset latency was prolonged at 83 minutes, and REM onset was normal at 87 minutes.  Sleep efficiency was moderately decreased at 76%.  RESPIRATORY DATA:  The patient was found to have 3 obstructive apneas and 37 obstructive hypopneas, giving him an apnea/hypopnea index of 9 events per hour.  He was also noted to have 45 respiratory effort- related arousals, giving him a respiratory disturbance index of 19 events per hour.  The events occurred in all body positions and were increased during REM.  There was loud snoring noted throughout.  OXYGEN DATA:  There was O2 desaturation as low as 80% with the patient's obstructive events.  CARDIAC DATA:  No clinically significant arrhythmias were noted.  MOVEMENT-PARASOMNIA:  The patient had no significant leg jerks or other abnormal behaviors seen.  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  There was O2 desaturation as low as 80%.  Treatment for this degree of sleep apnea can include a trial of weight loss alone, upper airway surgery, dental appliance, and also CPAP.  Clinical correlation is suggested.    Barbaraann Share, MD,FCCP Diplomate, American Board of Sleep Medicine Electronically Signed   [facility]   D:  11/20/2010 15:12:10  T:  11/21/2010 04:56:53  Job:  [id number]

## 2010-11-28 ENCOUNTER — Ambulatory Visit: Payer: Medicaid Other | Admitting: Pulmonary Disease

## 2010-12-15 ENCOUNTER — Encounter: Payer: Self-pay | Admitting: Internal Medicine

## 2010-12-19 ENCOUNTER — Ambulatory Visit (INDEPENDENT_AMBULATORY_CARE_PROVIDER_SITE_OTHER): Payer: Medicaid Other | Admitting: Pulmonary Disease

## 2010-12-19 ENCOUNTER — Encounter: Payer: Self-pay | Admitting: Pulmonary Disease

## 2010-12-19 VITALS — BP 110/80 | HR 78 | Temp 97.8°F | Ht 70.0 in | Wt 283.0 lb

## 2010-12-19 DIAGNOSIS — G4733 Obstructive sleep apnea (adult) (pediatric): Secondary | ICD-10-CM

## 2010-12-19 NOTE — Patient Instructions (Signed)
Will start on cpap.  Please call if having tolerance issues. Work on weight loss followup with me in 5 weeks.  

## 2010-12-19 NOTE — Progress Notes (Signed)
  Subjective:    Patient ID: Craig Rosales, male    DOB: [birth year], 30 y.o.   MRN: [id number]  HPI The pt comes in today for f/u of his recent sleep study.  He was found to have an AHI 9/hr, but also had significant numbers of RERA's, giving him a RDI of 19/hr.  His events were most prominent during REM.  I have reviewed the study in detail with him, and answered all of his questions.    Review of Systems  Constitutional: Positive for unexpected weight change. Negative for fever.  HENT: Negative for ear pain, nosebleeds, congestion, sore throat, rhinorrhea, sneezing, trouble swallowing, dental problem, postnasal drip and sinus pressure.   Eyes: Negative for redness and itching.  Respiratory: Negative for cough, chest tightness, shortness of breath and wheezing.   Cardiovascular: Positive for leg swelling. Negative for palpitations.  Gastrointestinal: Negative for nausea and vomiting.  Genitourinary: Negative for dysuria.  Musculoskeletal: Negative for joint swelling.  Skin: Negative for rash.  Neurological: Negative for headaches.  Hematological: Does not bruise/bleed easily.  Psychiatric/Behavioral: Positive for dysphoric mood. The patient is not nervous/anxious.        Objective:   Physical Exam Ow male in nad No purulence or discharge noted from nares LE without edema, no cyanosis noted. Appears mildly sleepy, moves all 4 .        Assessment & Plan:

## 2010-12-25 ENCOUNTER — Encounter: Payer: Self-pay | Admitting: Pulmonary Disease

## 2010-12-25 NOTE — Assessment & Plan Note (Addendum)
The pt has mild to moderate osa by his sleep study, and I have reviewed the various treatment options with him.  They include a trial of weight loss alone, upper airway surgery, dental appliance, and cpap.  I would favor the latter two if he is going to treat this aggressively.  After a long discussion, the pt has decided to try cpap while working on weight reduction.  I will set the patient up on cpap at a moderate pressure level to allow for desensitization, and will troubleshoot the device over the next 4-6weeks if needed.  The pt is to call me if having issues with tolerance.  Will then optimize the pressure once patient is able to wear cpap on a consistent basis.

## 2010-12-30 ENCOUNTER — Encounter: Payer: Self-pay | Admitting: Pulmonary Disease

## 2011-01-22 ENCOUNTER — Emergency Department: Payer: Self-pay | Admitting: Internal Medicine

## 2011-01-23 ENCOUNTER — Ambulatory Visit: Payer: Medicaid Other | Admitting: Pulmonary Disease

## 2011-02-16 ENCOUNTER — Encounter: Payer: Self-pay | Admitting: Pulmonary Disease

## 2011-02-16 ENCOUNTER — Ambulatory Visit (INDEPENDENT_AMBULATORY_CARE_PROVIDER_SITE_OTHER): Payer: Medicaid Other | Admitting: Pulmonary Disease

## 2011-02-16 VITALS — BP 124/80 | HR 81 | Temp 97.6°F | Ht 70.0 in | Wt 295.8 lb

## 2011-02-16 DIAGNOSIS — G4733 Obstructive sleep apnea (adult) (pediatric): Secondary | ICD-10-CM

## 2011-02-16 NOTE — Progress Notes (Signed)
  Subjective:    Patient ID: Craig Rosales, male    DOB: 1980-07-21, 30 y.o.   MRN: [id number]  HPI Patient comes in today for followup of his sleep apnea.  He was started on CPAP at the last visit and a moderate pressure level, and feels that he has been tolerating the device quite well.  He denies any issues with mask fit or pressure, but continues to have intermittent non-restorative sleep.  He also has issues with sleepiness during the day with periods of inactivity, although improved.  He is trying to work on weight reduction, but has not been very successful.  I had a long discussion with him about this.   Review of Systems  Constitutional: Positive for unexpected weight change. Negative for fever.  HENT: Positive for sore throat, sneezing, dental problem and postnasal drip. Negative for ear pain, nosebleeds, congestion, rhinorrhea, mouth sores, trouble swallowing and sinus pressure.   Eyes: Positive for redness. Negative for itching.  Respiratory: Positive for cough, chest tightness and wheezing. Negative for shortness of breath.   Cardiovascular: Positive for palpitations and leg swelling.  Gastrointestinal: Positive for nausea. Negative for vomiting.  Genitourinary: Negative for dysuria.  Musculoskeletal: Positive for joint swelling.  Skin: Negative for rash.  Neurological: Positive for headaches.  Hematological: Does not bruise/bleed easily.  Psychiatric/Behavioral: Positive for dysphoric mood. The patient is nervous/anxious.        Objective:   Physical Exam Obese male in no acute distress No skin breakdown or pressure necrosis from the CPAP mask Chest is clear Cardiac exam with regular rate and rhythm Lower extremities with minimal edema, no cyanosis noted Alert and oriented, moves all 4 extremities.  Does not appear sleepy currently       Assessment & Plan:

## 2011-02-16 NOTE — Assessment & Plan Note (Signed)
The patient has adapted well to CPAP, and is having no issues with the mask fit or pressure.  He continues to have nonrestorative sleep at times, as well as excessive daytime sleepiness.  I have reminded him that we have yet to optimize his pressure, and we'll use the next few weeks on automatic setting to do this.  I have also encouraged him to work more aggressively on weight loss. Care Plan:  At this point, will arrange for the patient's machine to be changed over to auto mode for 2 weeks to optimize their pressure.  I will review the downloaded data once sent by dme, and also evaluate for compliance, leaks, and residual osa.  I will call the patient and dme to discuss the results, and have the patient's machine set appropriately.  This will serve as the pt's cpap pressure titration.

## 2011-02-16 NOTE — Patient Instructions (Signed)
Will get your pressure adjusted for you by setting your machine on "auto mode" for the next 2-3 weeks.  Will call you with results once I receive your download. Continue working on weight loss.  Make sure you are really working during your exercise sessions.  If you are doing well once we make pressure adjustments, will see you back in 6mos.  If you still feel you are not resting with cpap, you need to call me.

## 2011-02-20 ENCOUNTER — Ambulatory Visit
Admission: RE | Admit: 2011-02-20 | Discharge: 2011-02-20 | Disposition: A | Discharge status: Home / Self Care | Payer: Medicaid Other | Source: Ambulatory Visit | Attending: Neurological Surgery | Admitting: Neurological Surgery

## 2011-02-20 ENCOUNTER — Other Ambulatory Visit: Payer: Self-pay | Admitting: Neurological Surgery

## 2011-02-20 DIAGNOSIS — M5137 Other intervertebral disc degeneration, lumbosacral region: Secondary | ICD-10-CM

## 2011-02-20 DIAGNOSIS — M48061 Spinal stenosis, lumbar region without neurogenic claudication: Secondary | ICD-10-CM

## 2011-02-24 LAB — POCT I-STAT CREATININE: Creatinine, Ser: 1

## 2011-02-24 LAB — I-STAT 8, (EC8 V) (CONVERTED LAB)
Acid-base deficit: 1
BUN: 17
Bicarbonate: 25 — ABNORMAL HIGH
Chloride: 107
HCT: 46
Hemoglobin: 15.6
Operator id: 179121
Sodium: 140

## 2011-02-28 ENCOUNTER — Other Ambulatory Visit: Payer: Self-pay | Admitting: Internal Medicine

## 2011-03-05 LAB — POCT I-STAT CREATININE
Creatinine, Ser: 1
Operator id: 284141

## 2011-03-05 LAB — I-STAT 8, (EC8 V) (CONVERTED LAB)
Bicarbonate: 24
Glucose, Bld: 93
HCT: 46
Potassium: 3.7
TCO2: 25
pCO2, Ven: 35.4 — ABNORMAL LOW
pH, Ven: 7.439 — ABNORMAL HIGH

## 2011-03-05 LAB — RAPID URINE DRUG SCREEN, HOSP PERFORMED
Benzodiazepines: NOT DETECTED
Cocaine: NOT DETECTED
Tetrahydrocannabinol: NOT DETECTED

## 2011-03-22 ENCOUNTER — Emergency Department: Payer: Self-pay | Admitting: Emergency Medicine

## 2011-03-27 ENCOUNTER — Emergency Department: Payer: Self-pay | Admitting: Emergency Medicine

## 2011-04-13 ENCOUNTER — Emergency Department (HOSPITAL_COMMUNITY)
Admission: AD | Admit: 2011-04-13 | Discharge: 2011-04-14 | Disposition: A | Discharge status: Home / Self Care | Payer: Medicaid Other | Source: Ambulatory Visit | Attending: Emergency Medicine | Admitting: Emergency Medicine

## 2011-04-13 ENCOUNTER — Emergency Department (HOSPITAL_COMMUNITY): Payer: Medicaid Other

## 2011-04-13 ENCOUNTER — Encounter (HOSPITAL_COMMUNITY): Payer: Self-pay | Admitting: *Deleted

## 2011-04-13 DIAGNOSIS — J45909 Unspecified asthma, uncomplicated: Secondary | ICD-10-CM | POA: Insufficient documentation

## 2011-04-13 DIAGNOSIS — Z8249 Family history of ischemic heart disease and other diseases of the circulatory system: Secondary | ICD-10-CM | POA: Insufficient documentation

## 2011-04-13 DIAGNOSIS — I10 Essential (primary) hypertension: Secondary | ICD-10-CM | POA: Insufficient documentation

## 2011-04-13 DIAGNOSIS — E1169 Type 2 diabetes mellitus with other specified complication: Secondary | ICD-10-CM | POA: Insufficient documentation

## 2011-04-13 DIAGNOSIS — R739 Hyperglycemia, unspecified: Secondary | ICD-10-CM

## 2011-04-13 DIAGNOSIS — E669 Obesity, unspecified: Secondary | ICD-10-CM | POA: Insufficient documentation

## 2011-04-13 DIAGNOSIS — G4730 Sleep apnea, unspecified: Secondary | ICD-10-CM | POA: Insufficient documentation

## 2011-04-13 DIAGNOSIS — E785 Hyperlipidemia, unspecified: Secondary | ICD-10-CM | POA: Insufficient documentation

## 2011-04-13 DIAGNOSIS — R5381 Other malaise: Secondary | ICD-10-CM | POA: Insufficient documentation

## 2011-04-13 DIAGNOSIS — R0789 Other chest pain: Secondary | ICD-10-CM | POA: Insufficient documentation

## 2011-04-13 HISTORY — DX: Sleep apnea, unspecified: G47.30

## 2011-04-13 LAB — CBC
HCT: 42.4 % (ref 39.0–52.0)
Hemoglobin: 14.1 g/dL (ref 13.0–17.0)
MCH: 27.4 pg (ref 26.0–34.0)
MCHC: 33.3 g/dL (ref 30.0–36.0)
RBC: 5.14 MIL/uL (ref 4.22–5.81)

## 2011-04-13 LAB — DIFFERENTIAL
Basophils Relative: 0 % (ref 0–1)
Eosinophils Absolute: 0 10*3/uL (ref 0.0–0.7)
Lymphs Abs: 2.7 10*3/uL (ref 0.7–4.0)
Monocytes Absolute: 0.8 10*3/uL (ref 0.1–1.0)
Monocytes Relative: 8 % (ref 3–12)
Neutrophils Relative %: 66 % (ref 43–77)

## 2011-04-13 LAB — BASIC METABOLIC PANEL
BUN: 17 mg/dL (ref 6–23)
CO2: 25 mEq/L (ref 19–32)
Chloride: 98 mEq/L (ref 96–112)
GFR calc non Af Amer: >90 mL/min (ref >90)
Glucose, Bld: 289 mg/dL — ABNORMAL HIGH (ref 70–99)
Potassium: 3.9 mEq/L (ref 3.5–5.1)
Sodium: 134 mEq/L — ABNORMAL LOW (ref 135–145)

## 2011-04-13 LAB — GLUCOSE, CAPILLARY: Glucose-Capillary: 301 mg/dL — ABNORMAL HIGH (ref 70–99)

## 2011-04-13 MED ORDER — SODIUM CHLORIDE 0.9 % IV SOLN: INTRAVENOUS | Status: DC

## 2011-04-13 MED ORDER — SODIUM CHLORIDE 0.9 % IV BOLUS (SEPSIS)
1000.0000 mL | Freq: Once | INTRAVENOUS | Status: AC
  Administered 2011-04-13: 1000 mL via INTRAVENOUS

## 2011-04-13 MED ORDER — ACETAMINOPHEN 325 MG PO TABS
650.0000 mg | ORAL_TABLET | Freq: Once | ORAL | Status: AC
  Administered 2011-04-13: 650 mg via ORAL
  Filled 2011-04-13: qty 2

## 2011-04-13 MED ORDER — SODIUM CHLORIDE 0.9 % IV SOLN
INTRAVENOUS | Status: DC
Start: 2011-04-13 — End: 2011-04-13

## 2011-04-13 NOTE — Progress Notes (Signed)
Came to visit wife and newborn son.  Was in the rm, and started feeling bad..Pain in chest, short of breath. Was feeling bad on the way over, but feels worse, just doesn't feel right.  ( had pneumonia shot).  This morning blood sugar was 360, is on lantus and metformin.  Has high cholesterol and high blood pressure, sleep apnea, reflux.

## 2011-04-13 NOTE — ED Notes (Signed)
No change in pt condtion. Continues to complain of CP 8/10. Normal sinus rhythm on monitor. Awaiting md eval.

## 2011-04-13 NOTE — Progress Notes (Signed)
1000cc normal saline started in right hand with 20g catheter @1728 

## 2011-04-13 NOTE — ED Provider Notes (Signed)
This 30 year old male was visiting his wife who just delivered a baby yesterday and the patient was at [facility] he developed a near fainting spell with generalized weakness, lightheadedness, warmth, sweats, nausea and also has a constant approximately 8-10 hour history now of reproducible positional right chest wall tenderness and pain which is nonexertional and nonpleuritic.  Hurman Horn, MD 04/14/11 [phone]

## 2011-04-13 NOTE — ED Notes (Signed)
Complaing of chest pain and SOB. Pt received nitro x 3 and asa 324 pta.

## 2011-04-13 NOTE — Progress Notes (Signed)
Weakness on right side.

## 2011-04-13 NOTE — ED Provider Notes (Signed)
History     CSN: [id number] Arrival date & time: 04/13/2011  5:12 PM   None     No chief complaint on file.   HPI Craig Rosales is a 30 y.o. male who presents to [facility] for chest pain. He was driving to [facility] to see his wife and new baby and began feeling tightness in his chest. By the time he walked to the room he felt short of breath and the chest pain was worse. Describes the pain as a pressure that feels like someone squeezing his chest. He had his pneumonia shot today. The patient was brought to [facility]U via wheel chair by staff.  Past Medical History  Diagnosis Date  . Hypertension   . Hyperlipidemia, mild     ldl 129 (1293/425), HDL 48, TG 27 (verified). LDL goal <130, ideally 100  . History of renal calculi   . Migraine headache   . Asthma   . Obesity   . DDD (degenerative disc disease), lumbar     Dr. Marikay Alar    Past Surgical History  Procedure Date  . Knee arthroscopy 2010    Right  . Tonsillectomy   . Lumbar fusion 2011  . Wisdom tooth extraction 2012    x 4  . Kidney stone surgery 2012    Family History  Problem Relation Age of Onset  . Hyperlipidemia Father   . Diabetes Father   . Hypertension Father   . COPD Father   . Breast cancer Paternal Grandmother   . Heart attack Paternal Grandmother 4  . Cancer Paternal Grandmother     BREAST  . Heart disease Paternal Grandmother     MI IN 91'S  . Diabetes Mother   . Hypertension Mother   . Hyperlipidemia Mother   . Heart attack Mother     x2  . Cancer Mother   . Heart disease Mother     MI x2  . Hypertension Maternal Uncle   . Kidney disease Maternal Uncle     KIDNEY FAILURE  . Diabetes Maternal Uncle   . Kidney failure Maternal Uncle   . Thyroid disease Brother   . Obesity Brother   . Hypertension Sister   . Anemia Sister   . Hyperlipidemia Sister     History  Substance Use Topics  . Smoking status: Former Smoker -- 0.2 packs/day for 5 years    Quit date: 05/18/2008  . Smokeless  tobacco: Never Used  . Alcohol Use: Yes      Review of Systems  Constitutional: Positive for diaphoresis.  HENT: Negative.   Eyes: Negative.   Respiratory: Positive for shortness of breath.   Cardiovascular: Positive for chest pain.  Gastrointestinal: Positive for nausea. Negative for abdominal distention.  Neurological: Positive for light-headedness.  Psychiatric/Behavioral: The patient is nervous/anxious.     Allergies  Latex  Home Medications  No current outpatient prescriptions on file.  BP 139/92  Pulse 110  Temp(Src) 99.9 F (37.7 C) (Oral)  Resp 20  SpO2 96%  Physical Exam  Nursing note and vitals reviewed. Constitutional: He is oriented to person, place, and time.       Obese black male.  HENT:  Head: Normocephalic and atraumatic.  Eyes: EOM are normal.  Neck: Neck supple.  Cardiovascular:       tachycardia  Pulmonary/Chest: Effort normal. No respiratory distress. He has no rales.  Musculoskeletal: Normal range of motion.  Neurological: He is alert and oriented to person, place, and time. No  cranial nerve deficit.  Skin: Skin is warm and dry.  Psychiatric: His behavior is normal. Judgment and thought content normal. His mood appears anxious.   Assessment: Chest pain with shortness of breath  Plan:  Consult with Dr. Fonnie Jarvis   Transfer to [facility] ED for further evaluation  ED Course  Procedures  MDM          Kerrie Buffalo, NP 04/13/11 1729

## 2011-04-13 NOTE — ED Provider Notes (Signed)
Attestation of Attending Supervision of Advanced Practitioner: Evaluation and management procedures were performed by the PA/NP/CNM/OB Fellow under my supervision/collaboration. Chart reviewed, and agree with management and plan.  Eilee Schader, M.D. 04/13/2011 7:51 PM   

## 2011-04-13 NOTE — ED Provider Notes (Signed)
History     CSN: [id number] Arrival date & time: 04/13/2011  5:12 PM   First MD Initiated Contact with Patient 04/13/11 2125      Chief Complaint  Patient presents with  . Chest Pain    (Consider location/radiation/quality/duration/timing/severity/associated sxs/prior treatment) Patient is a 30 y.o. male presenting with chest pain. The history is provided by the patient.  Chest Pain The chest pain began 6 - 12 hours ago. Chest pain occurs constantly. The chest pain is improving. The pain is associated with lifting. The severity of the pain is moderate. The quality of the pain is described as aching. The pain does not radiate. Chest pain is worsened by certain positions. Primary symptoms include fatigue (mild). Pertinent negatives for primary symptoms include no fever, no shortness of breath, no cough, no wheezing, no abdominal pain, no nausea and no vomiting.  Associated symptoms include diaphoresis (mild).  Pertinent negatives for associated symptoms include no lower extremity edema, no numbness and no weakness. He tried nothing for the symptoms.  His family medical history is significant for CAD in family.     Past Medical History  Diagnosis Date  . Hypertension   . Hyperlipidemia, mild     ldl 129 (1293/425), HDL 48, TG 27 (verified). LDL goal <130, ideally 100  . History of renal calculi   . Migraine headache   . Asthma   . Obesity   . DDD (degenerative disc disease), lumbar     Dr. Marikay Alar  . Diabetes mellitus   . Sleep apnea     Past Surgical History  Procedure Date  . Knee arthroscopy 2010    Right  . Tonsillectomy   . Lumbar fusion 2011  . Wisdom tooth extraction 2012    x 4  . Kidney stone surgery 2012    Family History  Problem Relation Age of Onset  . Hyperlipidemia Father   . Diabetes Father   . Hypertension Father   . COPD Father   . Breast cancer Paternal Grandmother   . Heart attack Paternal Grandmother 94  . Cancer Paternal Grandmother     BREAST  . Heart disease Paternal Grandmother     MI IN 51'S  . Diabetes Mother   . Hypertension Mother   . Hyperlipidemia Mother   . Heart attack Mother     x2  . Cancer Mother   . Heart disease Mother     MI x2  . Hypertension Maternal Uncle   . Kidney disease Maternal Uncle     KIDNEY FAILURE  . Diabetes Maternal Uncle   . Kidney failure Maternal Uncle   . Thyroid disease Brother   . Obesity Brother   . Hypertension Sister   . Anemia Sister   . Hyperlipidemia Sister     History  Substance Use Topics  . Smoking status: Former Smoker -- 0.2 packs/day for 5 years    Quit date: 05/18/2008  . Smokeless tobacco: Never Used  . Alcohol Use: No     former drinker      Review of Systems  Constitutional: Positive for diaphoresis (mild) and fatigue (mild). Negative for fever, chills and activity change.  HENT: Negative for congestion and neck pain.   Respiratory: Negative for cough, shortness of breath and wheezing.   Cardiovascular: Positive for chest pain.  Gastrointestinal: Negative for nausea, vomiting, abdominal pain, diarrhea and abdominal distention.  Genitourinary: Negative for difficulty urinating.  Musculoskeletal: Negative for gait problem.  Skin: Negative for rash.  Neurological:  Negative for weakness and numbness.  Psychiatric/Behavioral: Negative for behavioral problems and confusion.  All other systems reviewed and are negative.    Allergies  Latex  Home Medications   Current Outpatient Rx  Name Route Sig Dispense Refill  . ALBUTEROL SULFATE HFA 108 (90 BASE) MCG/ACT IN AERS Inhalation Inhale 2 puffs into the lungs every 6 (six) hours as needed. For shortness of breath.    Marland Kitchen VITAMIN C 500 MG PO TABS Oral Take 500 mg by mouth daily.      . BUDESONIDE-FORMOTEROL FUMARATE 160-4.5 MCG/ACT IN AERO Inhalation Inhale 2 puffs into the lungs 2 (two) times daily. Gargle and spit after use     . BUPROPION HCL ER (SR) 150 MG PO TB12 Oral Take 150 mg by mouth  daily.      Marland Kitchen VITAMIN D 1000 UNITS PO TABS Oral Take 1,000 Units by mouth 2 (two) times daily.      . CYCLOBENZAPRINE HCL 10 MG PO TABS Oral Take 10 mg by mouth 3 (three) times daily as needed. For muscle spasms.    . DOCUSATE SODIUM 100 MG PO CAPS Oral Take 100 mg by mouth 3 (three) times daily as needed. For constipation.    Marland Kitchen GABAPENTIN 300 MG PO CAPS Oral Take 300 mg by mouth daily.      . IPRATROPIUM-ALBUTEROL 0.5-2.5 (3) MG/3ML IN SOLN Nebulization Take 3 mLs by nebulization every 6 (six) hours as needed. For shortness of breath.    . METFORMIN HCL 500 MG PO TABS Oral Take 500 mg by mouth 2 (two) times daily.     . OXYCODONE-ACETAMINOPHEN 5-325 MG PO TABS Oral Take 1 tablet by mouth every 6 (six) hours as needed. For pain.    Marland Kitchen PRAVASTATIN SODIUM 40 MG PO TABS Oral Take 40 mg by mouth daily.      Marland Kitchen RANITIDINE HCL 150 MG PO TABS  TAKE ONE TABLET BY MOUTH EVERY 12 HOURS PRE MEAL 180 tablet 1  . SPIRONOLACTONE 25 MG PO TABS Oral Take 25 mg by mouth daily.      Marland Kitchen VITAMIN B-1 250 MG PO TABS Oral Take 250 mg by mouth 2 (two) times daily.       BP 116/67  Pulse 90  Temp(Src) 99.6 F (37.6 C) (Oral)  Resp 20  SpO2 99%  Physical Exam  Nursing note and vitals reviewed. Constitutional: He is oriented to person, place, and time. He appears well-developed and well-nourished. No distress.  HENT:  Head: Normocephalic.  Nose: Nose normal.  Eyes: EOM are normal. Pupils are equal, round, and reactive to light.  Neck: Normal range of motion. Neck supple. No JVD present.  Cardiovascular: Normal rate, regular rhythm and intact distal pulses.   No murmur heard. Pulmonary/Chest: Effort normal and breath sounds normal. No respiratory distress. He exhibits tenderness (mod R chest wall ttp).  Abdominal: Soft. Bowel sounds are normal. He exhibits no distension. There is no tenderness.  Musculoskeletal: Normal range of motion. He exhibits no edema and no tenderness.       No calf ttp  Neurological: He  is alert and oriented to person, place, and time.       Normal strength  Skin: Skin is warm and dry. He is not diaphoretic.  Psychiatric: He has a normal mood and affect. His behavior is normal. Thought content normal.    ED Course  Procedures (including critical care time)  Labs Reviewed  GLUCOSE, CAPILLARY - Abnormal; Notable for the following:  Glucose-Capillary 301 (*)    All other components within normal limits  BASIC METABOLIC PANEL - Abnormal; Notable for the following:    Sodium 134 (*)    Glucose, Bld 289 (*)    All other components within normal limits  CBC - Abnormal; Notable for the following:    Platelets 146 (*)    All other components within normal limits  GLUCOSE, CAPILLARY - Abnormal; Notable for the following:    Glucose-Capillary 245 (*)    All other components within normal limits  TROPONIN I  DIFFERENTIAL  POCT CBG MONITORING   Dg Chest 2 View  04/13/2011  *RADIOLOGY REPORT*  Clinical Data: Chest pain and right-sided weakness.  History of diabetes, aspirin smoking.  CHEST - 2 VIEW  Comparison: Chest radiograph performed 05/24/2010, and CTA of the chest performed 05/26/2010  Findings: The lungs are well-aerated.  Mild left midlung airspace opacification, apparently within the left lingula, raises concern for pneumonia.  There is no evidence of pleural effusion or pneumothorax.  The heart is normal in size; the mediastinal contour is within normal limits.  No acute osseous abnormalities are seen.  IMPRESSION: Mild lingular opacification raises concern for pneumonia.  Original Report Authenticated By: Tonia Ghent, M.D.     1. Chest pain   2. Hyperglycemia      Date: 04/13/11  Rate: 95  Rhythm: normal sinus rhythm  QRS Axis: normal  Intervals: normal  ST/T Wave abnormalities: normal  Conduction Disutrbances:none  Narrative Interpretation:   Old EKG Reviewed: unchanged    MDM  PT with atypical R sided CP.  EKG negative.  Has extensive personal  and family RF, so troponin ordered.  Negative in setting of 8+ hrs constant pain.  CXR with mild questionable opacity in lingula, but no fever, cough, or other evidence of pna.  Also on wrong side.  No abx at this time.  Hyperglycemia, but this is known problem and being followed by pcp (lantus adjustments).  Low Risk by Wells and PERC neg.  CP reproducible on exam and likely MSK.  Discussed with pt and will have f/u with pcp for reeval and possible outpt stress test.  Return precautions discussed.         Milus Glazier 04/14/11 [phone]

## 2011-04-14 NOTE — ED Provider Notes (Signed)
I saw and evaluated the patient, reviewed the resident's note and Pt's ECG and I agree with the findings and plan.  Hurman Horn, MD 04/14/11 [phone]

## 2011-04-20 ENCOUNTER — Emergency Department: Payer: Self-pay | Admitting: *Deleted

## 2011-05-28 ENCOUNTER — Emergency Department: Payer: Self-pay | Admitting: *Deleted

## 2011-05-28 LAB — RAPID INFLUENZA A&B ANTIGENS

## 2011-05-30 ENCOUNTER — Other Ambulatory Visit: Payer: Self-pay

## 2011-05-30 ENCOUNTER — Encounter (HOSPITAL_COMMUNITY): Payer: Self-pay | Admitting: *Deleted

## 2011-05-30 ENCOUNTER — Emergency Department (HOSPITAL_COMMUNITY): Payer: Self-pay

## 2011-05-30 ENCOUNTER — Emergency Department (HOSPITAL_COMMUNITY)
Admission: EM | Admit: 2011-05-30 | Discharge: 2011-05-30 | Disposition: A | Discharge status: Home / Self Care | Payer: Self-pay | Attending: Emergency Medicine | Admitting: Emergency Medicine

## 2011-05-30 DIAGNOSIS — M51379 Other intervertebral disc degeneration, lumbosacral region without mention of lumbar back pain or lower extremity pain: Secondary | ICD-10-CM | POA: Insufficient documentation

## 2011-05-30 DIAGNOSIS — I10 Essential (primary) hypertension: Secondary | ICD-10-CM | POA: Insufficient documentation

## 2011-05-30 DIAGNOSIS — Z79899 Other long term (current) drug therapy: Secondary | ICD-10-CM | POA: Insufficient documentation

## 2011-05-30 DIAGNOSIS — G4730 Sleep apnea, unspecified: Secondary | ICD-10-CM | POA: Insufficient documentation

## 2011-05-30 DIAGNOSIS — Z794 Long term (current) use of insulin: Secondary | ICD-10-CM | POA: Insufficient documentation

## 2011-05-30 DIAGNOSIS — Z87442 Personal history of urinary calculi: Secondary | ICD-10-CM | POA: Insufficient documentation

## 2011-05-30 DIAGNOSIS — M5137 Other intervertebral disc degeneration, lumbosacral region: Secondary | ICD-10-CM | POA: Insufficient documentation

## 2011-05-30 DIAGNOSIS — R0602 Shortness of breath: Secondary | ICD-10-CM | POA: Insufficient documentation

## 2011-05-30 DIAGNOSIS — Z87891 Personal history of nicotine dependence: Secondary | ICD-10-CM | POA: Insufficient documentation

## 2011-05-30 DIAGNOSIS — R072 Precordial pain: Secondary | ICD-10-CM | POA: Insufficient documentation

## 2011-05-30 DIAGNOSIS — E119 Type 2 diabetes mellitus without complications: Secondary | ICD-10-CM | POA: Insufficient documentation

## 2011-05-30 DIAGNOSIS — J45909 Unspecified asthma, uncomplicated: Secondary | ICD-10-CM | POA: Insufficient documentation

## 2011-05-30 DIAGNOSIS — E785 Hyperlipidemia, unspecified: Secondary | ICD-10-CM | POA: Insufficient documentation

## 2011-05-30 DIAGNOSIS — R42 Dizziness and giddiness: Secondary | ICD-10-CM | POA: Insufficient documentation

## 2011-05-30 LAB — BASIC METABOLIC PANEL
BUN: 7 mg/dL (ref 6–23)
CO2: 25 mEq/L (ref 19–32)
Calcium: 10.1 mg/dL (ref 8.4–10.5)
Chloride: 98 mEq/L (ref 96–112)
Creatinine, Ser: 0.89 mg/dL (ref 0.50–1.35)
GFR calc Af Amer: >90 mL/min (ref >90)
GFR calc non Af Amer: >90 mL/min (ref >90)
Glucose, Bld: 269 mg/dL — ABNORMAL HIGH (ref 70–99)
Potassium: 4.1 mEq/L (ref 3.5–5.1)
Sodium: 133 mEq/L — ABNORMAL LOW (ref 135–145)

## 2011-05-30 LAB — TROPONIN I: Troponin I: <0.3 ng/mL (ref <0.30)

## 2011-05-30 LAB — GLUCOSE, CAPILLARY: Glucose-Capillary: 258 mg/dL — ABNORMAL HIGH (ref 70–99)

## 2011-05-30 LAB — D-DIMER, QUANTITATIVE: D-Dimer, Quant: 0.28 ug/mL-FEU (ref 0.00–0.48)

## 2011-05-30 MED ORDER — SODIUM CHLORIDE 0.9 % IV BOLUS (SEPSIS)
1000.0000 mL | Freq: Once | INTRAVENOUS | Status: AC
  Administered 2011-05-30: 1000 mL via INTRAVENOUS

## 2011-05-30 MED ORDER — OXYCODONE-ACETAMINOPHEN 5-325 MG PO TABS
1.0000 | ORAL_TABLET | Freq: Once | ORAL | Status: AC
Start: 2011-05-30 — End: 2011-05-30
  Administered 2011-05-30: 1 via ORAL
  Filled 2011-05-30: qty 1

## 2011-05-30 NOTE — ED Notes (Signed)
Patient states he was eating dinner, started feeling dizzy after dinner. Then started having tightness in his chest and chest pain on his way home from dinner. States he feels somewhat nauseated, but denies vomiting. Also states he has had some shortness of breath.

## 2011-05-30 NOTE — ED Provider Notes (Signed)
History     30yM with CP. Onset shortly after eating dinner. Substernal. Feels tight. No radiation. Currently still has. Felt dizzy earlier but that has resolved. No fever or chills. No sob. No unusual leg pain or swelling. Denies hx of blood clot. Similar episode about a month ago.  CSN: [id number]  Arrival date & time 05/30/11  2010   First MD Initiated Contact with Patient 05/30/11 2037      Chief Complaint  Patient presents with  . Chest Pain  . Shortness of Breath  . Dizziness    (Consider location/radiation/quality/duration/timing/severity/associated sxs/prior treatment) HPI  Past Medical History  Diagnosis Date  . Hypertension   . Hyperlipidemia, mild     ldl 129 (1293/425), HDL 48, TG 27 (verified). LDL goal <130, ideally 100  . History of renal calculi   . Migraine headache   . Asthma   . Obesity   . DDD (degenerative disc disease), lumbar     Dr. Marikay Alar  . Diabetes mellitus   . Sleep apnea     Past Surgical History  Procedure Date  . Knee arthroscopy 2010    Right  . Tonsillectomy   . Lumbar fusion 2011  . Wisdom tooth extraction 2012    x 4  . Kidney stone surgery 2012    Family History  Problem Relation Age of Onset  . Hyperlipidemia Father   . Diabetes Father   . Hypertension Father   . COPD Father   . Breast cancer Paternal Grandmother   . Heart attack Paternal Grandmother 52  . Cancer Paternal Grandmother     BREAST  . Heart disease Paternal Grandmother     MI IN 2'S  . Diabetes Mother   . Hypertension Mother   . Hyperlipidemia Mother   . Heart attack Mother     x2  . Cancer Mother   . Heart disease Mother     MI x2  . Hypertension Maternal Uncle   . Kidney disease Maternal Uncle     KIDNEY FAILURE  . Diabetes Maternal Uncle   . Kidney failure Maternal Uncle   . Thyroid disease Brother   . Obesity Brother   . Hypertension Sister   . Anemia Sister   . Hyperlipidemia Sister     History  Substance Use Topics  . Smoking  status: Former Smoker -- 0.2 packs/day for 5 years    Quit date: 05/18/2008  . Smokeless tobacco: Never Used  . Alcohol Use: No     former drinker      Review of Systems   Review of symptoms negative unless otherwise noted in HPI.   Allergies  Latex  Home Medications   Current Outpatient Rx  Name Route Sig Dispense Refill  . INSULIN GLARGINE 100 UNIT/ML [place] SOLN Subcutaneous Inject 80 Units into the skin at bedtime.    . INSULIN LISPRO PROT & LISPRO (75-25) 100 UNIT/ML [place] SUSP Subcutaneous Inject 15 Units into the skin.    . ALBUTEROL SULFATE HFA 108 (90 BASE) MCG/ACT IN AERS Inhalation Inhale 2 puffs into the lungs every 6 (six) hours as needed. For shortness of breath.    Marland Kitchen VITAMIN C 500 MG PO TABS Oral Take 500 mg by mouth daily.      . BUDESONIDE-FORMOTEROL FUMARATE 160-4.5 MCG/ACT IN AERO Inhalation Inhale 2 puffs into the lungs 2 (two) times daily. Gargle and spit after use     . BUPROPION HCL ER (SR) 150 MG PO TB12 Oral Take 150  mg by mouth daily.      Marland Kitchen VITAMIN D 1000 UNITS PO TABS Oral Take 1,000 Units by mouth 2 (two) times daily.      . CYCLOBENZAPRINE HCL 10 MG PO TABS Oral Take 10 mg by mouth 3 (three) times daily as needed. For muscle spasms.    . DOCUSATE SODIUM 100 MG PO CAPS Oral Take 100 mg by mouth 3 (three) times daily as needed. For constipation.    Marland Kitchen GABAPENTIN 300 MG PO CAPS Oral Take 300 mg by mouth daily.      . IPRATROPIUM-ALBUTEROL 0.5-2.5 (3) MG/3ML IN SOLN Nebulization Take 3 mLs by nebulization every 6 (six) hours as needed. For shortness of breath.    . METFORMIN HCL 500 MG PO TABS Oral Take 500 mg by mouth 2 (two) times daily.     . OXYCODONE-ACETAMINOPHEN 5-325 MG PO TABS Oral Take 1 tablet by mouth every 6 (six) hours as needed. For pain.    Marland Kitchen PRAVASTATIN SODIUM 40 MG PO TABS Oral Take 40 mg by mouth daily.      Marland Kitchen RANITIDINE HCL 150 MG PO TABS  TAKE ONE TABLET BY MOUTH EVERY 12 HOURS PRE MEAL 180 tablet 1  . SPIRONOLACTONE 25 MG PO TABS Oral Take  25 mg by mouth daily.      Marland Kitchen VITAMIN B-1 250 MG PO TABS Oral Take 250 mg by mouth 2 (two) times daily.       BP 144/91  Pulse 99  Temp(Src) 98.1 F (36.7 C) (Oral)  Resp 22  Ht 5\' 10"  (1.778 m)  Wt 286 lb (129.729 kg)  BMI 41.04 kg/m2  SpO2 98%  Physical Exam  Nursing note and vitals reviewed. Constitutional: He appears well-developed and well-nourished. No distress.       Sitting up in bed. Nad. obese  HENT:  Head: Normocephalic and atraumatic.  Eyes: Conjunctivae are normal. Right eye exhibits no discharge. Left eye exhibits no discharge.  Neck: Neck supple.  Cardiovascular: Normal rate, regular rhythm and normal heart sounds.  Exam reveals no gallop and no friction rub.   No murmur heard. Pulmonary/Chest: Effort normal and breath sounds normal. No respiratory distress. He exhibits no tenderness.  Abdominal: Soft. He exhibits no distension. There is no tenderness.  Musculoskeletal: He exhibits no edema and no tenderness.       Legs symmetric. No calf tenderness  Neurological: He is alert.  Skin: Skin is warm and dry. He is not diaphoretic.  Psychiatric: He has a normal mood and affect. His behavior is normal. Thought content normal.    ED Course  Procedures (including critical care time)  Labs Reviewed  BASIC METABOLIC PANEL - Abnormal; Notable for the following:    Sodium 133 (*)    Glucose, Bld 269 (*)    All other components within normal limits  GLUCOSE, CAPILLARY - Abnormal; Notable for the following:    Glucose-Capillary 258 (*)    All other components within normal limits  TROPONIN I  D-DIMER, QUANTITATIVE  LAB REPORT - SCANNED   No results found.  Dg Chest 2 View  05/30/2011  *RADIOLOGY REPORT*  Clinical Data: Dizziness, chest pain, history hypertension, asthma, diabetes  CHEST - 2 VIEW  Comparison: 04/13/2011  Findings: Normal heart size, mediastinal contours, and pulmonary vascularity. Right basilar atelectasis. Lungs otherwise clear. No pleural effusion  or pneumothorax. No acute osseous findings.  IMPRESSION: Right basilar atelectasis.  Original Report Authenticated By: Lollie Marrow, M.D.    EKG:  Rhythm: normal sinus Rate: 96 Axis: normal Intervals: normal ST segments: NS ST changes   1. Chest pain       MDM  30yM with CP. Consider ACS, PE, infectious, pneumothorax, anxiety, musculoskeletal. Doubt ACS. EKG non suggestive and trp wnl. Low clinical suspicion for PE and normal dimer. CXR with no focal acute findings. Non reproducible pain. Etiology unclear but feel pt very low risk and appropriate for outpt fu.       Raeford Razor, MD 06/05/11 1020

## 2011-05-30 NOTE — ED Notes (Signed)
Pt states pain started approximately a week ago, has intensified today. Pt describes pain as a tightness in chest with a squeezing feeling, dizziness,  nausea  And generalized weakness.

## 2011-06-13 ENCOUNTER — Emergency Department: Payer: Self-pay | Admitting: *Deleted

## 2011-07-03 ENCOUNTER — Other Ambulatory Visit: Payer: Self-pay

## 2011-07-03 ENCOUNTER — Inpatient Hospital Stay (HOSPITAL_COMMUNITY)
Admission: EM | Admit: 2011-07-03 | Discharge: 2011-07-07 | DRG: 392 | Disposition: A | Discharge status: Home / Self Care | Payer: Medicaid Other | Attending: Internal Medicine | Admitting: Internal Medicine

## 2011-07-03 ENCOUNTER — Encounter (HOSPITAL_COMMUNITY): Payer: Self-pay

## 2011-07-03 ENCOUNTER — Emergency Department (HOSPITAL_COMMUNITY): Payer: Medicaid Other

## 2011-07-03 DIAGNOSIS — E118 Type 2 diabetes mellitus with unspecified complications: Secondary | ICD-10-CM | POA: Diagnosis present

## 2011-07-03 DIAGNOSIS — I10 Essential (primary) hypertension: Secondary | ICD-10-CM | POA: Diagnosis present

## 2011-07-03 DIAGNOSIS — J45901 Unspecified asthma with (acute) exacerbation: Secondary | ICD-10-CM | POA: Diagnosis present

## 2011-07-03 DIAGNOSIS — R0602 Shortness of breath: Secondary | ICD-10-CM | POA: Diagnosis present

## 2011-07-03 DIAGNOSIS — Z794 Long term (current) use of insulin: Secondary | ICD-10-CM

## 2011-07-03 DIAGNOSIS — IMO0002 Reserved for concepts with insufficient information to code with codable children: Secondary | ICD-10-CM | POA: Diagnosis present

## 2011-07-03 DIAGNOSIS — R0789 Other chest pain: Secondary | ICD-10-CM | POA: Diagnosis present

## 2011-07-03 DIAGNOSIS — E669 Obesity, unspecified: Secondary | ICD-10-CM | POA: Diagnosis present

## 2011-07-03 DIAGNOSIS — R739 Hyperglycemia, unspecified: Secondary | ICD-10-CM

## 2011-07-03 DIAGNOSIS — K219 Gastro-esophageal reflux disease without esophagitis: Principal | ICD-10-CM | POA: Diagnosis present

## 2011-07-03 DIAGNOSIS — D72829 Elevated white blood cell count, unspecified: Secondary | ICD-10-CM

## 2011-07-03 DIAGNOSIS — M51379 Other intervertebral disc degeneration, lumbosacral region without mention of lumbar back pain or lower extremity pain: Secondary | ICD-10-CM | POA: Diagnosis present

## 2011-07-03 DIAGNOSIS — F341 Dysthymic disorder: Secondary | ICD-10-CM | POA: Diagnosis present

## 2011-07-03 DIAGNOSIS — Z9989 Dependence on other enabling machines and devices: Secondary | ICD-10-CM

## 2011-07-03 DIAGNOSIS — R079 Chest pain, unspecified: Secondary | ICD-10-CM | POA: Diagnosis present

## 2011-07-03 DIAGNOSIS — E860 Dehydration: Secondary | ICD-10-CM

## 2011-07-03 DIAGNOSIS — G4733 Obstructive sleep apnea (adult) (pediatric): Secondary | ICD-10-CM | POA: Diagnosis present

## 2011-07-03 DIAGNOSIS — Z87898 Personal history of other specified conditions: Secondary | ICD-10-CM

## 2011-07-03 DIAGNOSIS — E119 Type 2 diabetes mellitus without complications: Secondary | ICD-10-CM

## 2011-07-03 DIAGNOSIS — M5137 Other intervertebral disc degeneration, lumbosacral region: Secondary | ICD-10-CM | POA: Diagnosis present

## 2011-07-03 DIAGNOSIS — J45909 Unspecified asthma, uncomplicated: Secondary | ICD-10-CM | POA: Diagnosis present

## 2011-07-03 HISTORY — DX: Type 2 diabetes mellitus with diabetic neuropathy, unspecified: E11.40

## 2011-07-03 HISTORY — DX: Angina pectoris, unspecified: I20.9

## 2011-07-03 HISTORY — DX: Gastro-esophageal reflux disease without esophagitis: K21.9

## 2011-07-03 HISTORY — DX: Anxiety disorder, unspecified: F41.9

## 2011-07-03 HISTORY — DX: Shortness of breath: R06.02

## 2011-07-03 HISTORY — DX: Pneumonia, unspecified organism: J18.9

## 2011-07-03 LAB — URINALYSIS, ROUTINE W REFLEX MICROSCOPIC
Bilirubin Urine: NEGATIVE
Leukocytes, UA: NEGATIVE
Nitrite: NEGATIVE
Specific Gravity, Urine: 1.026 (ref 1.005–1.030)
Urobilinogen, UA: 0.2 mg/dL (ref 0.0–1.0)

## 2011-07-03 LAB — GLUCOSE, CAPILLARY
Glucose-Capillary: 300 mg/dL — ABNORMAL HIGH (ref 70–99)
Glucose-Capillary: 383 mg/dL — ABNORMAL HIGH (ref 70–99)

## 2011-07-03 LAB — BASIC METABOLIC PANEL
BUN: 11 mg/dL (ref 6–23)
Calcium: 9.3 mg/dL (ref 8.4–10.5)
GFR calc non Af Amer: >90 mL/min (ref >90)
Glucose, Bld: 568 mg/dL (ref 70–99)
Sodium: 129 mEq/L — ABNORMAL LOW (ref 135–145)

## 2011-07-03 LAB — URINE MICROSCOPIC-ADD ON

## 2011-07-03 LAB — CBC
Hemoglobin: 13.1 g/dL (ref 13.0–17.0)
MCH: 28.1 pg (ref 26.0–34.0)
MCHC: 33.5 g/dL (ref 30.0–36.0)

## 2011-07-03 LAB — POCT I-STAT TROPONIN I

## 2011-07-03 MED ORDER — INSULIN ASPART 100 UNIT/ML ~~LOC~~ SOLN: 6.0000 [IU] | Freq: Once | SUBCUTANEOUS | Status: DC

## 2011-07-03 MED ORDER — SODIUM CHLORIDE 0.9 % IV BOLUS (SEPSIS)
1000.0000 mL | Freq: Once | INTRAVENOUS | Status: AC
  Administered 2011-07-03: 1000 mL via INTRAVENOUS

## 2011-07-03 MED ORDER — MORPHINE SULFATE 4 MG/ML IJ SOLN
4.0000 mg | Freq: Once | INTRAMUSCULAR | Status: AC
  Administered 2011-07-03: 4 mg via INTRAVENOUS
  Filled 2011-07-03: qty 1

## 2011-07-03 MED ORDER — ONDANSETRON HCL 4 MG/2ML IJ SOLN
4.0000 mg | Freq: Once | INTRAMUSCULAR | Status: AC
  Administered 2011-07-03: 4 mg via INTRAVENOUS
  Filled 2011-07-03: qty 2

## 2011-07-03 MED ORDER — INSULIN REGULAR HUMAN 100 UNIT/ML IJ SOLN
INTRAMUSCULAR | Status: AC
  Administered 2011-07-03: 3.5 [IU]/h via INTRAVENOUS
  Filled 2011-07-03: qty 1

## 2011-07-03 NOTE — ED Provider Notes (Signed)
History     CSN: [id number]  Arrival date & time 07/03/11  1636   First MD Initiated Contact with Patient 07/03/11 2021      Chief Complaint  Patient presents with  . Shortness of Breath  . Hyperglycemia  . Abdominal Pain  . Back Pain    (Consider location/radiation/quality/duration/timing/severity/associated sxs/prior treatment) Patient is a 31 y.o. male presenting with shortness of breath, abdominal pain, and back pain. The history is provided by the patient, the spouse and medical records.  Shortness of Breath  Associated symptoms include chest pain, cough and shortness of breath. Pertinent negatives include no fever.  Abdominal Pain The primary symptoms of the illness include abdominal pain, shortness of breath and nausea. The primary symptoms of the illness do not include fever, vomiting or diarrhea.  Additional symptoms associated with the illness include frequency and back pain. Symptoms associated with the illness do not include chills.  Back Pain  Associated symptoms include chest pain and abdominal pain. Pertinent negatives include no fever and no headaches.   the patient is a 31 year old male, with insulin-dependent diabetes, who has a primary care physician in [place], [place].  He presents to emergency department complaining of polyuria, polydipsia, chest pain, with a nonproductive cough.  He denies fevers.  He has nausea, but no vomiting.  He has not had diarrhea.  He denies fevers, and chills and sweating.  He has also had an approximately 15 pound weight loss over the past week.  Past Medical History  Diagnosis Date  . Hypertension   . Hyperlipidemia, mild     ldl 129 (1293/425), HDL 48, TG 27 (verified). LDL goal <130, ideally 100  . History of renal calculi   . Migraine headache   . Asthma   . Obesity   . DDD (degenerative disc disease), lumbar     Dr. Marikay Alar  . Diabetes mellitus   . Sleep apnea     Past Surgical History  Procedure Date  .  Knee arthroscopy 2010    Right  . Tonsillectomy   . Lumbar fusion 2011  . Wisdom tooth extraction 2012    x 4  . Kidney stone surgery 2012    Family History  Problem Relation Age of Onset  . Hyperlipidemia Father   . Diabetes Father   . Hypertension Father   . COPD Father   . Breast cancer Paternal Grandmother   . Heart attack Paternal Grandmother 4  . Cancer Paternal Grandmother     BREAST  . Heart disease Paternal Grandmother     MI IN 28'S  . Diabetes Mother   . Hypertension Mother   . Hyperlipidemia Mother   . Heart attack Mother     x2  . Cancer Mother   . Heart disease Mother     MI x2  . Hypertension Maternal Uncle   . Kidney disease Maternal Uncle     KIDNEY FAILURE  . Diabetes Maternal Uncle   . Kidney failure Maternal Uncle   . Thyroid disease Brother   . Obesity Brother   . Hypertension Sister   . Anemia Sister   . Hyperlipidemia Sister     History  Substance Use Topics  . Smoking status: Former Smoker -- 0.2 packs/day for 5 years    Quit date: 05/18/2008  . Smokeless tobacco: Never Used  . Alcohol Use: No     former drinker      Review of Systems  Constitutional: Positive for unexpected weight change.  Negative for fever, chills and appetite change.  Respiratory: Positive for cough, chest tightness and shortness of breath.   Cardiovascular: Positive for chest pain.  Gastrointestinal: Positive for nausea and abdominal pain. Negative for vomiting and diarrhea.  Genitourinary: Positive for frequency.  Musculoskeletal: Positive for back pain.  Neurological: Negative for headaches.  Psychiatric/Behavioral: Negative for confusion.  All other systems reviewed and are negative.    Allergies  Latex  Home Medications   Current Outpatient Rx  Name Route Sig Dispense Refill  . ALBUTEROL SULFATE HFA 108 (90 BASE) MCG/ACT IN AERS Inhalation Inhale 2 puffs into the lungs every 6 (six) hours as needed. For shortness of breath.    Marland Kitchen VITAMIN C 500 MG  PO TABS Oral Take 500 mg by mouth daily.      . BECLOMETHASONE DIPROPIONATE 80 MCG/ACT IN AERS Inhalation Inhale 1 puff into the lungs 2 (two) times daily.    . BUPROPION HCL ER (SR) 150 MG PO TB12 Oral Take 150 mg by mouth daily.      Marland Kitchen VITAMIN D 1000 UNITS PO TABS Oral Take 1,000 Units by mouth 2 (two) times daily.      . CYCLOBENZAPRINE HCL 10 MG PO TABS Oral Take 10 mg by mouth 3 (three) times daily as needed. For muscle spasms.    Marland Kitchen GABAPENTIN 300 MG PO CAPS Oral Take 600-1,200 mg by mouth 2 (two) times daily. Take 600 mg in the morning and 1200 mg at night    . INSULIN GLARGINE 100 UNIT/ML [place] SOLN Subcutaneous Inject 85 Units into the skin at bedtime.     . INSULIN LISPRO PROT & LISPRO (75-25) 100 UNIT/ML [place] SUSP Subcutaneous Inject 20 Units into the skin 3 (three) times daily with meals.     . IPRATROPIUM-ALBUTEROL 0.5-2.5 (3) MG/3ML IN SOLN Nebulization Take 3 mLs by nebulization every 6 (six) hours as needed. For shortness of breath.    . METFORMIN HCL 500 MG PO TABS Oral Take 1,000 mg by mouth 2 (two) times daily.     Marland Kitchen PIOGLITAZONE HCL 15 MG PO TABS Oral Take 15 mg by mouth daily.    Marland Kitchen PRAVASTATIN SODIUM 40 MG PO TABS Oral Take 40 mg by mouth daily.      Marland Kitchen RANITIDINE HCL 150 MG PO TABS Oral Take 150 mg by mouth 2 (two) times daily.    Marland Kitchen SPIRONOLACTONE 25 MG PO TABS Oral Take 25 mg by mouth daily.      Marland Kitchen VITAMIN B-1 250 MG PO TABS Oral Take 250 mg by mouth 2 (two) times daily.       BP 124/76  Pulse 106  Temp(Src) 99.3 F (37.4 C) (Oral)  SpO2 96%  Physical Exam  Vitals reviewed. Constitutional: He is oriented to person, place, and time.       Morbidly obese  HENT:  Head: Normocephalic and atraumatic.  Eyes: Conjunctivae are normal. Pupils are equal, round, and reactive to light.  Neck: Normal range of motion. Neck supple.  Cardiovascular:  No murmur heard.      Tachycardia  Pulmonary/Chest: Effort normal. No respiratory distress. He has no rales.  Abdominal: Soft. He  exhibits no distension. There is no tenderness.  Musculoskeletal: Normal range of motion.  Neurological: He is alert and oriented to person, place, and time.  Skin: Skin is warm and dry.  Psychiatric: He has a normal mood and affect.    ED Course  Procedures (including critical care time) 31 year old male, with insulin-dependent diabetes  mellitus, presents with polyuria, polydipsia, weight loss, chest pain, and nausea.  He has not had a fever.  He has had a nonproductive cough and shortness of breath, as well.  We will perform a chest x-ray, to look for pneumonia, and lab tests to determine the degree of his hyperglycemia. if He is in DKA and also a possible etiology for his hyperglycemia.    Labs Reviewed  GLUCOSE, CAPILLARY - Abnormal; Notable for the following:    Glucose-Capillary 300 (*)    All other components within normal limits  URINALYSIS, ROUTINE W REFLEX MICROSCOPIC - Abnormal; Notable for the following:    Glucose, UA >1000 (*)    All other components within normal limits  URINE MICROSCOPIC-ADD ON - Abnormal; Notable for the following:    Squamous Epithelial / LPF FEW (*)    All other components within normal limits  CBC - Abnormal; Notable for the following:    WBC 11.5 (*)    Platelets 136 (*)    All other components within normal limits  BASIC METABOLIC PANEL - Abnormal; Notable for the following:    Sodium 129 (*)    Chloride 94 (*)    Glucose, Bld 568 (*)    All other components within normal limits  GLUCOSE, CAPILLARY - Abnormal; Notable for the following:    Glucose-Capillary 406 (*)    All other components within normal limits  POCT I-STAT TROPONIN I   Dg Chest 2 View  07/03/2011  *RADIOLOGY REPORT*  Clinical Data: Short of breath.  Chest tightness.  History of hypertension and diabetes.  CHEST - 2 VIEW  Comparison: 05/30/2011.  Findings: Chronic elevation of the right hemidiaphragm.  No airspace disease.  No effusion.  Mild right basilar atelectasis.  Cardiopericardial silhouette and mediastinal contours are within normal limits.  IMPRESSION: No interval change.  No acute cardiopulmonary disease.  Elevation of the right hemidiaphragm with right basilar atelectasis.  Original Report Authenticated By: Andreas Newport, M.D.     No diagnosis found.  Spoke with triad. She will admit'  CRITICAL CARE Performed by: Nicholes Stairs   Total critical care time: 30 min  Critical care time was exclusive of separately billable procedures and treating other patients.  Critical care was necessary to treat or prevent imminent or life-threatening deterioration.  Critical care was time spent personally by me on the following activities: development of treatment plan with patient and/or surrogate as well as nursing, discussions with consultants, evaluation of patient's response to treatment, examination of patient, obtaining history from patient or surrogate, ordering and performing treatments and interventions, ordering and review of laboratory studies, ordering and review of radiographic studies, pulse oximetry and re-evaluation of patient's condition.  MDM  hyperglycemia        Nicholes Stairs, MD 07/04/11 [phone]

## 2011-07-03 NOTE — ED Notes (Signed)
Patient transported to X-ray, ambulatory with radiology tech. Family in waiting room.

## 2011-07-03 NOTE — ED Notes (Signed)
Hx DM. States that his blood sugars have been running high in the 500s x 4 weeks. Went to PCP Tuesday and had his Novolog increased to 20 units three times/day. He also takes Lantus 85 units QHS, actos and metformin. Patient has have urinary frequency, increased thirst and blurry vision for the past 4 weeks.

## 2011-07-03 NOTE — ED Notes (Signed)
Patient sitting in lobby with wife. Warm blanket given. Currently waiting to placed in room; aaox3, resp e/u, and.

## 2011-07-03 NOTE — ED Notes (Signed)
Pt states that for the past week he has been having elevated blood sugars in the 500's. Pt has increased his humalog per orders of pcp. He states that his blood sugars have continued to stay high. Pt states that today he laid down with his daughter to take a nap and woke up feeling short of breath. He took a breathing treatment with some relief. Pt states that he has been having the chills on and off and just hasn't been feeling well. Pt is seen by [facility]cp and was seen at the pulmonary doctor. He was screened that day for copd because of his increasing shortness of breath.

## 2011-07-04 ENCOUNTER — Other Ambulatory Visit: Payer: Self-pay

## 2011-07-04 ENCOUNTER — Encounter (HOSPITAL_COMMUNITY): Payer: Self-pay | Admitting: Internal Medicine

## 2011-07-04 DIAGNOSIS — G4733 Obstructive sleep apnea (adult) (pediatric): Secondary | ICD-10-CM | POA: Diagnosis present

## 2011-07-04 DIAGNOSIS — R739 Hyperglycemia, unspecified: Secondary | ICD-10-CM | POA: Diagnosis present

## 2011-07-04 DIAGNOSIS — R079 Chest pain, unspecified: Secondary | ICD-10-CM | POA: Diagnosis present

## 2011-07-04 DIAGNOSIS — R0602 Shortness of breath: Secondary | ICD-10-CM | POA: Diagnosis present

## 2011-07-04 LAB — CBC
MCH: 27.7 pg (ref 26.0–34.0)
MCV: 83 fL (ref 78.0–100.0)
Platelets: 146 10*3/uL — ABNORMAL LOW (ref 150–400)
RDW: 13.4 % (ref 11.5–15.5)
WBC: 10.8 10*3/uL — ABNORMAL HIGH (ref 4.0–10.5)

## 2011-07-04 LAB — GLUCOSE, CAPILLARY
Glucose-Capillary: 296 mg/dL — ABNORMAL HIGH (ref 70–99)
Glucose-Capillary: 231 mg/dL — ABNORMAL HIGH (ref 70–99)
Glucose-Capillary: 233 mg/dL — ABNORMAL HIGH (ref 70–99)
Glucose-Capillary: 300 mg/dL — ABNORMAL HIGH (ref 70–99)
Glucose-Capillary: 247 mg/dL — ABNORMAL HIGH (ref 70–99)
Glucose-Capillary: 142 mg/dL — ABNORMAL HIGH (ref 70–99)

## 2011-07-04 LAB — COMPREHENSIVE METABOLIC PANEL
AST: 19 U/L (ref 0–37)
Albumin: 3.7 g/dL (ref 3.5–5.2)
Alkaline Phosphatase: 63 U/L (ref 39–117)
BUN: 9 mg/dL (ref 6–23)
CO2: 26 mEq/L (ref 19–32)
Chloride: 101 mEq/L (ref 96–112)
GFR calc non Af Amer: >90 mL/min (ref >90)
Potassium: 3.4 mEq/L — ABNORMAL LOW (ref 3.5–5.1)
Total Bilirubin: 0.6 mg/dL (ref 0.3–1.2)

## 2011-07-04 LAB — CARDIAC PANEL(CRET KIN+CKTOT+MB+TROPI)
CK, MB: 1.9 ng/mL (ref 0.3–4.0)
Relative Index: 1.4 (ref 0.0–2.5)
Total CK: 164 U/L (ref 7–232)
Total CK: 148 U/L (ref 7–232)
Troponin I: <0.3 ng/mL (ref <0.30)
Troponin I: <0.3 ng/mL (ref <0.30)

## 2011-07-04 LAB — PHOSPHORUS: Phosphorus: 3.4 mg/dL (ref 2.3–4.6)

## 2011-07-04 MED ORDER — ACETAMINOPHEN 325 MG PO TABS
650.0000 mg | ORAL_TABLET | Freq: Four times a day (QID) | ORAL | Status: DC | PRN
  Administered 2011-07-06: 650 mg via ORAL
  Filled 2011-07-04: qty 2

## 2011-07-04 MED ORDER — OXYCODONE-ACETAMINOPHEN 5-325 MG PO TABS
1.0000 | ORAL_TABLET | Freq: Four times a day (QID) | ORAL | Status: DC | PRN
  Administered 2011-07-04 – 2011-07-07 (×10): 1 via ORAL
  Filled 2011-07-04 (×12): qty 1

## 2011-07-04 MED ORDER — ONDANSETRON HCL 4 MG PO TABS
4.0000 mg | ORAL_TABLET | Freq: Four times a day (QID) | ORAL | Status: DC | PRN
  Administered 2011-07-06 (×2): 4 mg via ORAL
  Filled 2011-07-04 (×2): qty 1

## 2011-07-04 MED ORDER — ALBUTEROL SULFATE (5 MG/ML) 0.5% IN NEBU: 2.5000 mg | INHALATION_SOLUTION | Freq: Four times a day (QID) | RESPIRATORY_TRACT | Status: DC | PRN

## 2011-07-04 MED ORDER — ALBUTEROL SULFATE HFA 108 (90 BASE) MCG/ACT IN AERS
2.0000 | INHALATION_SPRAY | Freq: Four times a day (QID) | RESPIRATORY_TRACT | Status: DC | PRN
  Administered 2011-07-05: 2 via RESPIRATORY_TRACT
  Filled 2011-07-04: qty 6.7

## 2011-07-04 MED ORDER — ALBUTEROL SULFATE (5 MG/ML) 0.5% IN NEBU
INHALATION_SOLUTION | RESPIRATORY_TRACT | Status: AC
  Administered 2011-07-04: 2.5 mg via RESPIRATORY_TRACT
  Filled 2011-07-04: qty 0.5

## 2011-07-04 MED ORDER — ASPIRIN EC 81 MG PO TBEC
81.0000 mg | DELAYED_RELEASE_TABLET | Freq: Every day | ORAL | Status: DC
Start: 2011-07-04 — End: 2011-07-07
  Administered 2011-07-04 – 2011-07-07 (×4): 81 mg via ORAL
  Filled 2011-07-04 (×4): qty 1

## 2011-07-04 MED ORDER — INSULIN GLARGINE 100 UNIT/ML ~~LOC~~ SOLN
15.0000 [IU] | Freq: Once | SUBCUTANEOUS | Status: AC
  Administered 2011-07-04: 15 [IU] via SUBCUTANEOUS
  Filled 2011-07-04 (×2): qty 3

## 2011-07-04 MED ORDER — ONDANSETRON HCL 4 MG/2ML IJ SOLN
4.0000 mg | Freq: Four times a day (QID) | INTRAMUSCULAR | Status: DC | PRN
  Administered 2011-07-04: 4 mg via INTRAVENOUS

## 2011-07-04 MED ORDER — IPRATROPIUM BROMIDE 0.02 % IN SOLN: 0.5000 mg | Freq: Four times a day (QID) | RESPIRATORY_TRACT | Status: DC | PRN

## 2011-07-04 MED ORDER — SODIUM CHLORIDE 0.9 % IV SOLN
INTRAVENOUS | Status: DC
  Administered 2011-07-05 – 2011-07-06 (×3): via INTRAVENOUS

## 2011-07-04 MED ORDER — IPRATROPIUM BROMIDE 0.02 % IN SOLN
RESPIRATORY_TRACT | Status: AC
  Administered 2011-07-04: 0.5 mg
  Filled 2011-07-04: qty 2.5

## 2011-07-04 MED ORDER — ALUM & MAG HYDROXIDE-SIMETH 200-200-20 MG/5ML PO SUSP
30.0000 mL | Freq: Four times a day (QID) | ORAL | Status: DC | PRN
  Administered 2011-07-06: 30 mL via ORAL
  Filled 2011-07-04: qty 30

## 2011-07-04 MED ORDER — BUPROPION HCL ER (SR) 150 MG PO TB12
150.0000 mg | ORAL_TABLET | Freq: Every day | ORAL | Status: DC
Start: 2011-07-04 — End: 2011-07-07
  Administered 2011-07-04 – 2011-07-07 (×4): 150 mg via ORAL
  Filled 2011-07-04 (×4): qty 1

## 2011-07-04 MED ORDER — SODIUM CHLORIDE 0.9 % IV SOLN
INTRAVENOUS | Status: DC
  Administered 2011-07-04: 4.6 [IU]/h via INTRAVENOUS
  Administered 2011-07-04 (×2): via INTRAVENOUS
  Filled 2011-07-04 (×2): qty 1

## 2011-07-04 MED ORDER — ALBUTEROL SULFATE (5 MG/ML) 0.5% IN NEBU
2.5000 mg | INHALATION_SOLUTION | RESPIRATORY_TRACT | Status: DC | PRN
  Administered 2011-07-04: 2.5 mg via RESPIRATORY_TRACT
  Filled 2011-07-04 (×2): qty 0.5

## 2011-07-04 MED ORDER — GABAPENTIN 300 MG PO CAPS: 600.0000 mg | ORAL_CAPSULE | Freq: Two times a day (BID) | ORAL | Status: DC

## 2011-07-04 MED ORDER — FLUTICASONE PROPIONATE HFA 44 MCG/ACT IN AERO
2.0000 | INHALATION_SPRAY | Freq: Two times a day (BID) | RESPIRATORY_TRACT | Status: DC
  Administered 2011-07-04 – 2011-07-07 (×6): 2 via RESPIRATORY_TRACT
  Filled 2011-07-04: qty 10.6

## 2011-07-04 MED ORDER — GUAIFENESIN-DM 100-10 MG/5ML PO SYRP: 5.0000 mL | ORAL_SOLUTION | ORAL | Status: DC | PRN

## 2011-07-04 MED ORDER — SPIRONOLACTONE 25 MG PO TABS
25.0000 mg | ORAL_TABLET | Freq: Every day | ORAL | Status: DC
  Administered 2011-07-04 – 2011-07-05 (×2): 25 mg via ORAL
  Filled 2011-07-04 (×2): qty 1

## 2011-07-04 MED ORDER — SODIUM CHLORIDE 0.9 % IJ SOLN
3.0000 mL | Freq: Two times a day (BID) | INTRAMUSCULAR | Status: DC
Start: 2011-07-04 — End: 2011-07-07
  Administered 2011-07-04 – 2011-07-07 (×5): 3 mL via INTRAVENOUS

## 2011-07-04 MED ORDER — GABAPENTIN 600 MG PO TABS
1200.0000 mg | ORAL_TABLET | Freq: Every day | ORAL | Status: DC
  Administered 2011-07-04 – 2011-07-06 (×3): 1200 mg via ORAL
  Filled 2011-07-04 (×4): qty 2

## 2011-07-04 MED ORDER — SIMVASTATIN 20 MG PO TABS
20.0000 mg | ORAL_TABLET | Freq: Every day | ORAL | Status: DC
  Administered 2011-07-04 – 2011-07-06 (×4): 20 mg via ORAL
  Filled 2011-07-04 (×4): qty 1

## 2011-07-04 MED ORDER — INSULIN GLARGINE 100 UNIT/ML ~~LOC~~ SOLN
15.0000 [IU] | Freq: Two times a day (BID) | SUBCUTANEOUS | Status: DC
  Administered 2011-07-04 – 2011-07-05 (×2): 15 [IU] via SUBCUTANEOUS

## 2011-07-04 MED ORDER — BUDESONIDE-FORMOTEROL FUMARATE 160-4.5 MCG/ACT IN AERO
2.0000 | INHALATION_SPRAY | Freq: Two times a day (BID) | RESPIRATORY_TRACT | Status: DC
  Administered 2011-07-04 – 2011-07-07 (×6): 2 via RESPIRATORY_TRACT
  Filled 2011-07-04: qty 6

## 2011-07-04 MED ORDER — GABAPENTIN 600 MG PO TABS
600.0000 mg | ORAL_TABLET | Freq: Every day | ORAL | Status: DC
  Administered 2011-07-04 – 2011-07-07 (×4): 600 mg via ORAL
  Filled 2011-07-04 (×4): qty 1

## 2011-07-04 MED ORDER — INSULIN ASPART 100 UNIT/ML ~~LOC~~ SOLN
0.0000 [IU] | Freq: Three times a day (TID) | SUBCUTANEOUS | Status: DC
  Administered 2011-07-05: 8 [IU] via SUBCUTANEOUS
  Administered 2011-07-05: 11 [IU] via SUBCUTANEOUS
  Administered 2011-07-05: 8 [IU] via SUBCUTANEOUS
  Administered 2011-07-06: 5 [IU] via SUBCUTANEOUS
  Administered 2011-07-06 (×2): 8 [IU] via SUBCUTANEOUS
  Administered 2011-07-07: 5 [IU] via SUBCUTANEOUS
  Administered 2011-07-07: 8 [IU] via SUBCUTANEOUS
  Administered 2011-07-07: 5 [IU] via SUBCUTANEOUS
  Filled 2011-07-04: qty 3

## 2011-07-04 MED ORDER — ACETAMINOPHEN 650 MG RE SUPP: 650.0000 mg | Freq: Four times a day (QID) | RECTAL | Status: DC | PRN

## 2011-07-04 MED ORDER — IPRATROPIUM-ALBUTEROL 0.5-2.5 (3) MG/3ML IN SOLN: 3.0000 mL | Freq: Four times a day (QID) | RESPIRATORY_TRACT | Status: DC | PRN

## 2011-07-04 MED ORDER — PIOGLITAZONE HCL 15 MG PO TABS
15.0000 mg | ORAL_TABLET | Freq: Every day | ORAL | Status: DC
  Administered 2011-07-04 – 2011-07-07 (×4): 15 mg via ORAL
  Filled 2011-07-04 (×4): qty 1

## 2011-07-04 NOTE — Progress Notes (Signed)
   CARE MANAGEMENT NOTE 07/04/2011  Patient:  Craig Rosales, Craig Rosales   Account Number:  [id number]  Date Initiated:  07/04/2011  Documentation initiated by:  [facility]  Subjective/Objective Assessment:   COPD, DM, HTN     Action/Plan:   lives at home with wife, Tresa Endo   Anticipated DC Date:  07/06/2011   Anticipated DC Plan:  HOME/SELF CARE      DC Planning Services  CM consult      Choice offered to / List presented to:             Status of service:  In process, will continue to follow Medicare Important Message given?   (If response is "NO", the following Medicare IM given date fields will be blank) Date Medicare IM given:   Date Additional Medicare IM given:    Discharge Disposition:    Per UR Regulation:    Comments:  07/04/2011 1600 Contacted pt and states his Medicaid is pending. Medicaid was d/c because of deductible too high and he was not able to meet. States he has CPAP at home. Contacted main pharmacy and pt is eligible for [employer] med assistance fund. NCM will need 3 day supply Rx to take to [facility] main pharmacy. Isidoro Donning RN CCM Case Mgmt phone [phone]

## 2011-07-04 NOTE — Plan of Care (Signed)
Problem: Food- and Nutrition-Related Knowledge Deficit (NB-1.1) Goal: Nutrition education Formal process to instruct or train a patient/client in a skill or to impart knowledge to help patients/clients voluntarily manage or modify food choices and eating behavior to maintain or improve health.  Outcome: Completed/Met Date Met:  07/04/11 RD consulted for Diet education. Patient follows a low sodium and carb controlled diet at home. Patient is interested in losing weight. Patient was given the heart healthy diet information and RD talked with patient about low sodium and carbohydrate diets, as part of a general healthy diet. Patients requested information about calorie levels for losing weight. RD provided appropriate information. Patient had no additional questions, encouraged to request RD if additional questions/concerns. Chart reviewed, no additional nutrition interventions at this time.   Craig Rosales

## 2011-07-04 NOTE — Progress Notes (Signed)
07/04/2011 - 6:21 AM - Placed patient on CPAP on his home settings of 16 cmH2Om, heated humidity set at 2, with a full face mask.  Patient has 2 L O2 bleed in into his CPAP circuit.  Patient is tolerating CPAP well and is resting comfortably.  Gypsy Decant, RRT, RCP.

## 2011-07-04 NOTE — Progress Notes (Signed)
Patient and examined; laboratory data reviewed. At this point agree with A/P as per Dr. Adela Glimpse H&P. Will follow work up and continue treating his hyperglycemic state with glucostabilizer. Will change to clear liwuid diet to facilitate weaning him off IV insuline. Will adjust maintenance and SSI after patient is off insulin drip. A1C 10.6.  Patient afebrile and feeling less SOB. 2-D echo pending. CE'z so far negative. No abnormalities on telemetry or EKG. Will continue CPAP at bedtime  Vassie Loll; MD [phone]

## 2011-07-04 NOTE — Progress Notes (Signed)
Patient placed on cpap 16 cmH20 with 2lpm 02. Patient is tolerating well. RT will continue to monitor.

## 2011-07-04 NOTE — H&P (Signed)
PCP:  [facility]  Chief Complaint:  presyncope  HPI: Craig Rosales is a 31 y.o. male   has a past medical history of Hypertension; Hyperlipidemia, mild; History of renal calculi; Migraine headache; Asthma; Obesity; DDD (degenerative disc disease), lumbar; Diabetes mellitus; Sleep apnea; and Diabetic neuropathy.   Presented with  Blood sugars in 500's for the past 1 week despite having his insulin increased. He reports being thirsty and increase in urination.  Today while sitting in the waiting room waiting on his son to be seen he felt he was about to pass out.  Patient reports occasional chest tightness but no pain, this is non exertional.  He is also very short of breath with exertion. States he gets winded by just walking across the room. He states he has been working hard on loosing weight but it seems to come back. He has been taking his medications as prescribed and did quit smoking.  He is using CPAP at home for his OSA.   Review of Systems:   Pertinent positives include: fatigue, nausea, shortness of breath at rest.dyspnea on exertion, non-productive cough, wheezing  Constitutional:  No weight loss, night sweats, Fevers, chills,  HEENT:  No headaches, Difficulty swallowing,Tooth/dental problems,Sore throat,  No sneezing, itching, ear ache, nasal congestion, post nasal drip,  Cardio-vascular:  No chest pain, Orthopnea, PND, anasarca, dizziness, palpitations.no Bilateral lower extremity swelling  GI:  No heartburn, indigestion, abdominal pain, vomiting, diarrhea, change in bowel habits, loss of appetite, melena, blood in stool, hematoemesis Resp:  No excess mucus, no productive cough, No No coughing up of blood.No change in color of mucus.No .No chest wall deformity  Skin:  no rash or lesions.  GU:  no dysuria, change in color of urine, no urgency or frequency. No flank pain.  Musculoskeletal:  No joint pain or swelling. No decreased range of motion. No back pain.  Psych:  No  change in mood or affect. No depression or anxiety. No memory loss.  Neuro: no localizing neurological complaints, no tingling, no weakness, no double vision, no gait abnormality, no slurred speech   Otherwise ROS are negative except for above, 10 systems were reviewed  Past Medical History: Past Medical History  Diagnosis Date  . Hypertension   . Hyperlipidemia, mild     ldl 129 (1293/425), HDL 48, TG 27 (verified). LDL goal <130, ideally 100  . History of renal calculi   . Migraine headache   . Asthma   . Obesity   . DDD (degenerative disc disease), lumbar     Dr. Marikay Alar  . Diabetes mellitus   . Sleep apnea   . Diabetic neuropathy    Past Surgical History  Procedure Date  . Knee arthroscopy 2010    Right  . Tonsillectomy   . Lumbar fusion 2011  . Wisdom tooth extraction 2012    x 4  . Kidney stone surgery 2012     Medications: Prior to Admission medications   Medication Sig Start Date End Date Taking? Authorizing Provider  albuterol (PROVENTIL HFA) 108 (90 BASE) MCG/ACT inhaler Inhale 2 puffs into the lungs every 6 (six) hours as needed. For shortness of breath.   Yes Historical Provider, MD  Ascorbic Acid (VITAMIN C) 500 MG tablet Take 500 mg by mouth daily.     Yes Historical Provider, MD  beclomethasone (QVAR) 80 MCG/ACT inhaler Inhale 1 puff into the lungs 2 (two) times daily.   Yes Historical Provider, MD  buPROPion (WELLBUTRIN SR) 150 MG 12  hr tablet Take 150 mg by mouth daily.     Yes Historical Provider, MD  cholecalciferol (VITAMIN D) 1000 UNITS tablet Take 1,000 Units by mouth 2 (two) times daily.     Yes Historical Provider, MD  cyclobenzaprine (FLEXERIL) 10 MG tablet Take 10 mg by mouth 3 (three) times daily as needed. For muscle spasms.   Yes Historical Provider, MD  gabapentin (NEURONTIN) 300 MG capsule Take 600-1,200 mg by mouth 2 (two) times daily. Take 600 mg in the morning and 1200 mg at night   Yes Historical Provider, MD  insulin glargine (LANTUS)  100 UNIT/ML injection Inject 85 Units into the skin at bedtime.    Yes Historical Provider, MD  insulin lispro protamine-insulin lispro (HUMALOG 75/25) (75-25) 100 UNIT/ML SUSP Inject 20 Units into the skin 3 (three) times daily with meals.    Yes Historical Provider, MD  ipratropium-albuterol (DUONEB) 0.5-2.5 (3) MG/3ML SOLN Take 3 mLs by nebulization every 6 (six) hours as needed. For shortness of breath.   Yes Historical Provider, MD  metFORMIN (GLUCOPHAGE) 500 MG tablet Take 1,000 mg by mouth 2 (two) times daily.    Yes Historical Provider, MD  pioglitazone (ACTOS) 15 MG tablet Take 15 mg by mouth daily.   Yes Historical Provider, MD  pravastatin (PRAVACHOL) 40 MG tablet Take 40 mg by mouth daily.     Yes Historical Provider, MD  ranitidine (ZANTAC) 150 MG tablet Take 150 mg by mouth 2 (two) times daily.   Yes Historical Provider, MD  spironolactone (ALDACTONE) 25 MG tablet Take 25 mg by mouth daily.     Yes Historical Provider, MD  Thiamine HCl (VITAMIN B-1) 250 MG tablet Take 250 mg by mouth 2 (two) times daily.    Yes Historical Provider, MD    Allergies:   Allergies  Allergen Reactions  . Latex Hives    Social History:  Ambulatory independently Lives at home with wife and 4 children   reports that he quit smoking about 3 years ago. He has never used smokeless tobacco. He reports that he does not drink alcohol or use illicit drugs.   Family History: family history includes Anemia in his sister; Breast cancer in his paternal grandmother; COPD in his father; Cancer in his mother and paternal grandmother; Diabetes in his father, maternal uncle, and mother; Heart attack in his mother; Heart attack (age of onset:70) in his paternal grandmother; Heart disease in his mother and paternal grandmother; Hyperlipidemia in his father, mother, and sister; Hypertension in his father, maternal uncle, mother, and sister; Kidney disease in his maternal uncle; Kidney failure in his maternal uncle;  Obesity in his brother; and Thyroid disease in his brother.    Physical Exam: Patient Vitals for the past 24 hrs:  BP Temp Temp src Pulse Resp SpO2  07/04/11 0200 141/96 mmHg - - 101  23  97 %  07/04/11 0129 135/80 mmHg - - 103  18  95 %  07/04/11 0107 - - - - - 97 %  07/04/11 0100 135/80 mmHg - - 101  18  98 %  07/04/11 0051 149/87 mmHg - - 99  24  98 %  07/04/11 0000 149/87 mmHg - - 95  26  97 %  07/03/11 2300 140/99 mmHg - - 95  26  97 %  07/03/11 1652 124/76 mmHg 99.3 F (37.4 C) Oral 106  - 96 %    1. General:  in No Acute distress 2. Psychological: Alert and Oriented 3. Head/ENT:  Dry Mucous Membranes                          Head Non traumatic, neck supple                          Normal  Dentition 4. SKIN: normal Skin turgor,  Skin clean Dry and intact no rash 5. Heart: Regular rate and rhythm no Murmur, Rub or gallop 6. Lungs: Clear to auscultation bilaterally, no wheezes or crackles   7. Abdomen: Soft, non-tender, Non distended, obese 8. Lower extremities: no clubbing, cyanosis, or edema 9. Neurologically Grossly intact, moving all 4 extremities equally 10. MSK: Normal range of motion  body mass index is unknown because there is no height or weight on file.   Labs on Admission:   [facility] 07/03/11 2034  NA 129*  K 3.9  CL 94*  CO2 24  GLUCOSE 568*  BUN 11  CREATININE 0.95  CALCIUM 9.3  MG --  PHOS --   No results found for this basename: AST:2,ALT:2,ALKPHOS:2,BILITOT:2,PROT:2,ALBUMIN:2 in the last 72 hours No results found for this basename: LIPASE:2,AMYLASE:2 in the last 72 hours  Basename 07/03/11 2034  WBC 11.5*  NEUTROABS --  HGB 13.1  HCT 39.1  MCV 83.9  PLT 136*   No results found for this basename: CKTOTAL:3,CKMB:3,CKMBINDEX:3,TROPONINI:3 in the last 72 hours No results found for this basename: TSH,T4TOTAL,FREET3,T3FREE,THYROIDAB in the last 72 hours No results found for this basename:  VITAMINB12:2,FOLATE:2,FERRITIN:2,TIBC:2,IRON:2,RETICCTPCT:2 in the last 72 hours Lab Results  Component Value Date   HGBA1C 6.0 08/04/2010    The CrCl is unknown because both a height and weight (above a minimum accepted value) are required for this calculation. ABG    Component Value Date/Time   HCO3 25.0* 04/14/2007 2135   TCO2 29 05/24/2010 2326   ACIDBASEDEF 1.0 04/14/2007 2135     Lab Results  Component Value Date   DDIMER 0.28 05/30/2011     Other results:  I have pearsonaly reviewed this: ECG REPORT  Rate: 97  Rhythm: NSR ST&T Change: no ischemic changes  UA no evidence of infection   Cultures: No results found for this basename: sdes, specrequest, cult, reptstatus       Radiological Exams on Admission: Dg Chest 2 View  07/03/2011  *RADIOLOGY REPORT*  Clinical Data: Short of breath.  Chest tightness.  History of hypertension and diabetes.  CHEST - 2 VIEW  Comparison: 05/30/2011.  Findings: Chronic elevation of the right hemidiaphragm.  No airspace disease.  No effusion.  Mild right basilar atelectasis. Cardiopericardial silhouette and mediastinal contours are within normal limits.  IMPRESSION: No interval change.  No acute cardiopulmonary disease.  Elevation of the right hemidiaphragm with right basilar atelectasis.  Original Report Authenticated By: Andreas Newport, M.D.    Assessment/Plan  31 yo w hx of DM 2 poorly controlled here with hyperglycemia, chest discomfort and SOB  Present on Admission:   .Chest pain - given risk factors will admit, monitor on telemetry, cycle cardiac enzymes, obtain serial ECG. Further risk stratify with lipid panel, hgA1C, obtain TSH. Make sure patient is on Aspirin. Further treatment based on the currently pending results.   .Shortness of breath - likely multifactorial given hx of asthma, obesity but also could not rule out cardiac source. The onset has been progressive. Will obtain echo .OSA on CPAP - will right for CPAP .ASTHMA -  seems stable at this time no evidence  of wheezing, normal CXR  .Hyperglycemia - currently improving on glucose stabilizer, etiology unclear at this point .HYPERTENSION - continue home meds  Prophylaxis: SCD Protonix  CODE STATUS: FULL CODE   Chelan Heringer 07/04/2011, 2:47 AM

## 2011-07-05 ENCOUNTER — Other Ambulatory Visit: Payer: Self-pay

## 2011-07-05 LAB — GLUCOSE, CAPILLARY
Glucose-Capillary: 334 mg/dL — ABNORMAL HIGH (ref 70–99)
Glucose-Capillary: 288 mg/dL — ABNORMAL HIGH (ref 70–99)
Glucose-Capillary: 368 mg/dL — ABNORMAL HIGH (ref 70–99)

## 2011-07-05 MED ORDER — INSULIN ASPART 100 UNIT/ML ~~LOC~~ SOLN
4.0000 [IU] | Freq: Three times a day (TID) | SUBCUTANEOUS | Status: DC
  Administered 2011-07-05 – 2011-07-06 (×3): 4 [IU] via SUBCUTANEOUS

## 2011-07-05 MED ORDER — SPIRONOLACTONE 50 MG PO TABS
50.0000 mg | ORAL_TABLET | Freq: Every day | ORAL | Status: DC
  Administered 2011-07-06 – 2011-07-07 (×2): 50 mg via ORAL
  Filled 2011-07-05 (×2): qty 1

## 2011-07-05 MED ORDER — INSULIN GLARGINE 100 UNIT/ML ~~LOC~~ SOLN
20.0000 [IU] | Freq: Two times a day (BID) | SUBCUTANEOUS | Status: DC
  Administered 2011-07-05 – 2011-07-06 (×2): 20 [IU] via SUBCUTANEOUS

## 2011-07-05 MED ORDER — DULOXETINE HCL 20 MG PO CPEP
20.0000 mg | ORAL_CAPSULE | Freq: Every day | ORAL | Status: DC
  Administered 2011-07-05 – 2011-07-07 (×3): 20 mg via ORAL
  Filled 2011-07-05 (×3): qty 1

## 2011-07-05 NOTE — Progress Notes (Signed)
*  PRELIMINARY RESULTS* Echocardiogram 2D Echocardiogram has been performed.  Glean Salen [facility] 07/05/2011, 12:22 PM

## 2011-07-05 NOTE — Progress Notes (Signed)
Subjective: Feeling a little SOB today; CBG's in the 290 range. Denies CP, fever, N/V and abdominal pain.  Objective: Vital signs in last 24 hours: Temp:  [98 F (36.7 C)-98.3 F (36.8 C)] 98 F (36.7 C) (02/17 1354) Pulse Rate:  [71-101] 71  (02/17 1354) Resp:  [18-20] 18  (02/17 1354) BP: (114-149)/(62-90) 149/90 mmHg (02/17 1354) SpO2:  [96 %-100 %] 99 % (02/17 1354) Weight:  [130.409 kg (287 lb 8 oz)] 130.409 kg (287 lb 8 oz) (02/17 0544) Weight change: -0.191 kg (-6.7 oz) Last BM Date: 07/01/11  Intake/Output from previous day: 02/16 0701 - 02/17 0700 In: 1605 [P.O.:1380; I.V.:225] Out: 3275 [Urine:3275] Total I/O In: 670 [P.O.:670] Out: 900 [Urine:900]   Physical Exam: General: Alert, awake, oriented x3, cooperative to examination. HEENT: No bruits, no goiter. Heart: Regular rate and rhythm, without murmurs, rubs, gallops. Lungs: Clear to auscultation bilaterally. Abdomen: Soft, nontender, nondistended, positive bowel sounds. Extremities: Trace edema bilaterally Neuro: Grossly intact, nonfocal.  Lab Results: Basic Metabolic Panel:  Basename 07/04/11 0530 07/03/11 2034  NA 137 129*  K 3.4* 3.9  CL 101 94*  CO2 26 24  GLUCOSE 158* 568*  BUN 9 11  CREATININE 0.86 0.95  CALCIUM 9.1 9.3  MG 1.8 --  PHOS 3.4 --   Liver Function Tests:  Basename 07/04/11 0530  AST 19  ALT 28  ALKPHOS 63  BILITOT 0.6  PROT 7.1  ALBUMIN 3.7   CBC:  Basename 07/04/11 0530 07/03/11 2034  WBC 10.8* 11.5*  NEUTROABS -- --  HGB 13.4 13.1  HCT 40.1 39.1  MCV 83.0 83.9  PLT 146* 136*   Cardiac Enzymes:  Basename 07/04/11 1712 07/04/11 1059 07/04/11 0434  CKTOTAL 148 145 164  CKMB 1.9 2.0 1.8  CKMBINDEX -- -- --  TROPONINI <0.30 <0.30 <0.30   CBG:  Basename 07/05/11 1111 07/05/11 0548 07/04/11 2124 07/04/11 1827 07/04/11 1722 07/04/11 1627  GLUCAP 334* 291* 338* 213* 106* 142*   Hemoglobin A1C:  Basename 07/04/11 0530  HGBA1C 10.6*   Thyroid Function  Tests:  Basename 07/04/11 0530  TSH 3.148  T4TOTAL --  FREET4 --  T3FREE --  THYROIDAB --   Urine Drug Screen: Drugs of Abuse     Component Value Date/Time   LABOPIA POSITIVE* 05/25/2010 0329   COCAINSCRNUR NONE DETECTED 05/25/2010 0329   LABBENZ NONE DETECTED 05/25/2010 0329   AMPHETMU NONE DETECTED 05/25/2010 0329   THCU NONE DETECTED 05/25/2010 0329   LABBARB  Value: NONE DETECTED        DRUG SCREEN FOR MEDICAL PURPOSES ONLY.  IF CONFIRMATION IS NEEDED FOR ANY PURPOSE, NOTIFY LAB WITHIN 5 DAYS.        LOWEST DETECTABLE LIMITS FOR URINE DRUG SCREEN Drug Class       Cutoff (ng/mL) Amphetamine      1000 Barbiturate      200 Benzodiazepine   200 Tricyclics       300 Opiates          300 Cocaine          300 THC              50 05/25/2010 0329    Urinalysis:  Basename 07/03/11 1954  COLORURINE YELLOW  LABSPEC 1.026  PHURINE 6.5  GLUCOSEU >1000*  HGBUR NEGATIVE  BILIRUBINUR NEGATIVE  KETONESUR NEGATIVE  PROTEINUR NEGATIVE  UROBILINOGEN 0.2  NITRITE NEGATIVE  LEUKOCYTESUR NEGATIVE   Studies/Results: Dg Chest 2 View  07/03/2011  *RADIOLOGY REPORT*  Clinical Data:  Short of breath.  Chest tightness.  History of hypertension and diabetes.  CHEST - 2 VIEW  Comparison: 05/30/2011.  Findings: Chronic elevation of the right hemidiaphragm.  No airspace disease.  No effusion.  Mild right basilar atelectasis. Cardiopericardial silhouette and mediastinal contours are within normal limits.  IMPRESSION: No interval change.  No acute cardiopulmonary disease.  Elevation of the right hemidiaphragm with right basilar atelectasis.  Original Report Authenticated By: Andreas Newport, M.D.    Medications: Scheduled Meds:    . aspirin EC  81 mg Oral Daily  . budesonide-formoterol  2 puff Inhalation BID  . buPROPion  150 mg Oral Daily  . DULoxetine  20 mg Oral Daily  . fluticasone  2 puff Inhalation BID  . gabapentin  1,200 mg Oral QHS  . gabapentin  600 mg Oral Daily  . insulin aspart  0-15 Units  Subcutaneous TID WC  . insulin aspart  4 Units Subcutaneous TID WC  . insulin aspart  6 Units Intravenous Once  . insulin glargine  15 Units Subcutaneous Once  . insulin glargine  20 Units Subcutaneous BID  . pioglitazone  15 mg Oral Daily  . simvastatin  20 mg Oral q1800  . sodium chloride  3 mL Intravenous Q12H  . spironolactone  50 mg Oral Daily  . DISCONTD: insulin glargine  15 Units Subcutaneous BID  . DISCONTD: spironolactone  25 mg Oral Daily   Continuous Infusions:   . sodium chloride 100 mL/hr at 07/05/11 1348  . insulin (NOVOLIN-R) infusion 12.2 Units/hr (07/04/11 1900)   PRN Meds:.acetaminophen, acetaminophen, albuterol, albuterol, albuterol, alum & mag hydroxide-simeth, guaiFENesin-dextromethorphan, ipratropium, ondansetron (ZOFRAN) IV, ondansetron, oxyCODONE-acetaminophen  Assessment/Plan: 1-Chest pain: cardiac enzymes negative X3; 2-D echo pending. Will finish ACS r/o work up and will continue PPI as well. Overall feeling better and denying CP right now.  2-HYPERTENSION: elevated; will change IVF to maintenance and will increase spironolactone to 50mg  daily for better control.  3-ASTHMA: No wheezing; continue current inhalers.  4-Shortness of breath: due to OHS, probable diastolic CHF and chronic asthma component. Continue inhalers. Base on 2-D echo results will decide starting or not daily diuretics. Start IS  5-OSA on CPAP: continue QHS CPAP.  6-Hyperglycemia with uncontrolled DM: out of insulin drip; will adjust lantus and start meal coverage for better controlled.  7-HLD: continue statins.  8-neuropathy: continue neurontin  9-Depression/anxiety: continue Wellbutrin and start cymbalta.    LOS: 2 days   Denis Koppel [facility] [phone]  07/05/2011, 2:03 PM

## 2011-07-06 LAB — GLUCOSE, CAPILLARY
Glucose-Capillary: 254 mg/dL — ABNORMAL HIGH (ref 70–99)
Glucose-Capillary: 250 mg/dL — ABNORMAL HIGH (ref 70–99)
Glucose-Capillary: 270 mg/dL — ABNORMAL HIGH (ref 70–99)

## 2011-07-06 MED ORDER — PNEUMOCOCCAL VAC POLYVALENT 25 MCG/0.5ML IJ INJ
0.5000 mL | INJECTION | INTRAMUSCULAR | Status: AC
  Administered 2011-07-07: 0.5 mL via INTRAMUSCULAR
  Filled 2011-07-06: qty 0.5

## 2011-07-06 MED ORDER — LIVING WELL WITH DIABETES BOOK
Freq: Once | Status: AC
  Administered 2011-07-06: 13:00:00
  Filled 2011-07-06: qty 1

## 2011-07-06 MED ORDER — ZOLPIDEM TARTRATE 5 MG PO TABS
5.0000 mg | ORAL_TABLET | Freq: Every evening | ORAL | Status: DC | PRN
  Administered 2011-07-06: 5 mg via ORAL
  Filled 2011-07-06: qty 1

## 2011-07-06 MED ORDER — INFLUENZA VIRUS VACC SPLIT PF IM SUSP
0.5000 mL | INTRAMUSCULAR | Status: AC
  Administered 2011-07-07: 0.5 mL via INTRAMUSCULAR
  Filled 2011-07-06: qty 0.5

## 2011-07-06 MED ORDER — INSULIN GLARGINE 100 UNIT/ML ~~LOC~~ SOLN
30.0000 [IU] | Freq: Two times a day (BID) | SUBCUTANEOUS | Status: DC
  Administered 2011-07-06 – 2011-07-07 (×2): 30 [IU] via SUBCUTANEOUS

## 2011-07-06 MED ORDER — INSULIN ASPART 100 UNIT/ML ~~LOC~~ SOLN
6.0000 [IU] | Freq: Three times a day (TID) | SUBCUTANEOUS | Status: DC
  Administered 2011-07-06 – 2011-07-07 (×4): 6 [IU] via SUBCUTANEOUS

## 2011-07-06 NOTE — Progress Notes (Signed)
Inpatient Diabetes Program Recommendations  AACE/ADA: New Consensus Statement on Inpatient Glycemic Control (2009)  Target Ranges:  Prepandial:   less than 140 mg/dL      Peak postprandial:   less than 180 mg/dL (1-2 hours)      Critically ill patients:  140 - 180 mg/dL   Diabetes Coordinator spoke with patient and answered questions about CHO counting and general dietary guidelines.  Left patient handouts for CHO counting and food choices.  Patient was appreciative of information and hopes to make lifestyle changes to impact his glucose control at home.    Will continue to follow during this admission. Thank you Piedad Climes RN,BSN,CDE Inpatient Diabetes Coordinator

## 2011-07-06 NOTE — Progress Notes (Signed)
Educated patient on heart healthy and carb modified diets. Patient verbalizes understanding of education. Patient received influenza and pneumococcal vaccination informational sheets and agreed to receiving vaccinations___D. Manson Passey RN

## 2011-07-06 NOTE — Progress Notes (Signed)
SPOKE WITH THE PT AND HE HAS STATED THAT HE WAS DENIED FOR HIS RENEWAL OF MEDICARE D/T NOT MEETING HIS DEDUCTIBLE.  FINANCIAL COUNCILING WAS ALREADY NOTIFIED.  PT STATED THAT HE NEEDS OP DIABETIC TEACHINGS AND ANYTHING THAT MAYBE ABLE TO HELP HIM, DIABETES COORDINATORS NOTIFIED.  I HAVE INFORMED THE PT THAT I WILL TRY TO PASS ALONG THE NEED TO GET MEDS OFF THE $4 LIST AT [facility] AND THAT HE CAN HAVE THE INSULIN VALVES THAT HE IS USING HERE NOW.  I HAVE ALSO INFORMED HIM OF THE INSULIN PRICES AT [facility] AS WELL AS THE GLUCOMETER AND STRIPS COST.  PT WAS ALSO INQUIRING ABOUT HIS SOB AND STATED THAT HE WANTED F/U ON EITHER A STRESS TEST OR A PULM FX TEST, I WILL INFORM DR. MADERA.  WILL F/U ON DC MEDS THROUGH [facility] SINCE HE DOES QUALIFY.   Willa Rough [phone] OR [phone] 07/06/2011

## 2011-07-06 NOTE — Progress Notes (Signed)
O2 Sat on RA at rest: 96%  O2 Sat on RA while ambulating/during exertion: 95-97%  O2 Sat on 2L via NCwhile ambulating/during exertion: 95-96%  O2 Sat on 2L via [place] while ambulating/during exertion: 97-98%  Patient became SOB after walking hallways______________________________________D. Manson Passey RN

## 2011-07-06 NOTE — Plan of Care (Signed)
Problem: Limited Adherence to Nutrition-Related Recommendations (NB-1.6) Goal: Nutrition education Formal process to instruct or train a patient/client in a skill or to impart knowledge to help patients/clients voluntarily manage or modify food choices and eating behavior to maintain or improve health.  Outcome: Completed/Met Date Met:  07/06/11 RN ask RD to see patient regarding weight loss goals. Patient was asking about quick weight loss stratagies. RD encouraged patient to aim for slow gradual weight loss though small diet changes. RD went over carbohydrate counting principles again as well as gave him 1800 kcal meal plan. RD went over food choices and how to modify current diet. Patient had no additional questions. Encouraged patient to notify RN if he needs additional education. Please re-consult if additional needs arise.   [name]

## 2011-07-06 NOTE — Progress Notes (Signed)
Subjective: Denies CP, fever, N/V and abdominal pain. CBG's in 260-270's range. Less SOB.  Objective: Vital signs in last 24 hours: Temp:  [97.4 F (36.3 C)-98.9 F (37.2 C)] 98.9 F (37.2 C) (02/18 2108) Pulse Rate:  [69-88] 76  (02/18 2108) Resp:  [18-20] 18  (02/18 2108) BP: (115-128)/(73-82) 122/82 mmHg (02/18 2108) SpO2:  [94 %-97 %] 97 % (02/18 2122) Weight:  [130.5 kg (287 lb 11.2 oz)] 130.5 kg (287 lb 11.2 oz) (02/18 0440) Weight change: 0.091 kg (3.2 oz) Last BM Date: 07/06/11  Intake/Output from previous day: 02/17 0701 - 02/18 0700 In: 2190 [P.O.:1340; I.V.:850] Out: 3575 [Urine:3575] Total I/O In: -  Out: 925 [Urine:925]   Physical Exam: General: Alert, awake, oriented x3, cooperative to examination. HEENT: No bruits, no goiter. Heart: Regular rate and rhythm, without murmurs, rubs, gallops. Lungs: Clear to auscultation bilaterally. Abdomen: Soft, nontender, nondistended, positive bowel sounds. Extremities: Trace edema bilaterally Neuro: Grossly intact, nonfocal.  Lab Results: Basic Metabolic Panel:  Basename 07/04/11 0530  NA 137  K 3.4*  CL 101  CO2 26  GLUCOSE 158*  BUN 9  CREATININE 0.86  CALCIUM 9.1  MG 1.8  PHOS 3.4   Liver Function Tests:  Basename 07/04/11 0530  AST 19  ALT 28  ALKPHOS 63  BILITOT 0.6  PROT 7.1  ALBUMIN 3.7   CBC:  Basename 07/04/11 0530  WBC 10.8*  NEUTROABS --  HGB 13.4  HCT 40.1  MCV 83.0  PLT 146*   Cardiac Enzymes:  Basename 07/04/11 1712 07/04/11 1059 07/04/11 0434  CKTOTAL 148 145 164  CKMB 1.9 2.0 1.8  CKMBINDEX -- -- --  TROPONINI <0.30 <0.30 <0.30   CBG:  Basename 07/06/11 2136 07/06/11 1638 07/06/11 1106 07/06/11 0626 07/05/11 2139 07/05/11 1606  GLUCAP 270* 250* 293* 254* 368* 288*   Hemoglobin A1C:  Basename 07/04/11 0530  HGBA1C 10.6*   Thyroid Function Tests:  Basename 07/04/11 0530  TSH 3.148  T4TOTAL --  FREET4 --  T3FREE --  THYROIDAB --   Urine Drug Screen: Drugs  of Abuse     Component Value Date/Time   LABOPIA POSITIVE* 05/25/2010 0329   COCAINSCRNUR NONE DETECTED 05/25/2010 0329   LABBENZ NONE DETECTED 05/25/2010 0329   AMPHETMU NONE DETECTED 05/25/2010 0329   THCU NONE DETECTED 05/25/2010 0329   LABBARB  Value: NONE DETECTED        DRUG SCREEN FOR MEDICAL PURPOSES ONLY.  IF CONFIRMATION IS NEEDED FOR ANY PURPOSE, NOTIFY LAB WITHIN 5 DAYS.        LOWEST DETECTABLE LIMITS FOR URINE DRUG SCREEN Drug Class       Cutoff (ng/mL) Amphetamine      1000 Barbiturate      200 Benzodiazepine   200 Tricyclics       300 Opiates          300 Cocaine          300 THC              50 05/25/2010 0329    Urinalysis: No results found for this basename: COLORURINE:2,APPERANCEUR:2,LABSPEC:2,PHURINE:2,GLUCOSEU:2,HGBUR:2,BILIRUBINUR:2,KETONESUR:2,PROTEINUR:2,UROBILINOGEN:2,NITRITE:2,LEUKOCYTESUR:2 in the last 72 hours Studies/Results: No results found.  Medications: Scheduled Meds:    . aspirin EC  81 mg Oral Daily  . budesonide-formoterol  2 puff Inhalation BID  . buPROPion  150 mg Oral Daily  . DULoxetine  20 mg Oral Daily  . fluticasone  2 puff Inhalation BID  . gabapentin  1,200 mg Oral QHS  . gabapentin  600  mg Oral Daily  . influenza  inactive virus vaccine  0.5 mL Intramuscular Tomorrow-1000  . insulin aspart  0-15 Units Subcutaneous TID WC  . insulin aspart  6 Units Subcutaneous TID WC  . insulin glargine  30 Units Subcutaneous BID  . living well with diabetes book   Does not apply Once  . pioglitazone  15 mg Oral Daily  . pneumococcal 23 valent vaccine  0.5 mL Intramuscular Tomorrow-1000  . simvastatin  20 mg Oral q1800  . sodium chloride  3 mL Intravenous Q12H  . spironolactone  50 mg Oral Daily  . DISCONTD: insulin aspart  4 Units Subcutaneous TID WC  . DISCONTD: insulin aspart  6 Units Intravenous Once  . DISCONTD: insulin glargine  20 Units Subcutaneous BID   Continuous Infusions:    . sodium chloride 50 mL/hr at 07/06/11 0914  . insulin (NOVOLIN-R)  infusion 12.2 Units/hr (07/04/11 1900)   PRN Meds:.acetaminophen, acetaminophen, albuterol, albuterol, albuterol, alum & mag hydroxide-simeth, guaiFENesin-dextromethorphan, ipratropium, ondansetron (ZOFRAN) IV, ondansetron, oxyCODONE-acetaminophen, zolpidem  Assessment/Plan: 1-Chest pain: cardiac enzymes negative X3; 2-D echo pending. Continue PPI. Overall feeling better and denying CP right now.  2-HYPERTENSION: better now. Continue current regimen.  3-ASTHMA: No wheezing; continue current inhalers. Stable.  4-Shortness of breath: due to OHS, probable diastolic CHF and chronic asthma component. Continue inhalers. Continue IS. Will follow 2-D echo and base on results start daily diuretics if needed.  5-OSA on CPAP: continue QHS CPAP.  6-Hyperglycemia with uncontrolled DM: will increased lantus to 30 bid and increased meal coverage to 6 units.  7-HLD: continue statins.  8-neuropathy: continue neurontin  9-Depression/anxiety: continue Wellbutrin and and cymbalta.    LOS: 3 days   Kohner Orlick [facility] [phone]  07/06/2011, 10:45 PM

## 2011-07-06 NOTE — Progress Notes (Signed)
Placed pt. On 16cmH2O CPAP via FFM (what pt. Wears at home) with 2L O2 bled in. Pt. Is tolerating CPAP well at this time.

## 2011-07-07 LAB — GLUCOSE, CAPILLARY
Glucose-Capillary: 226 mg/dL — ABNORMAL HIGH (ref 70–99)
Glucose-Capillary: 266 mg/dL — ABNORMAL HIGH (ref 70–99)
Glucose-Capillary: 237 mg/dL — ABNORMAL HIGH (ref 70–99)

## 2011-07-07 LAB — BASIC METABOLIC PANEL
BUN: 11 mg/dL (ref 6–23)
Chloride: 96 mEq/L (ref 96–112)
GFR calc Af Amer: >90 mL/min (ref >90)
Potassium: 4 mEq/L (ref 3.5–5.1)

## 2011-07-07 MED ORDER — OXYCODONE-ACETAMINOPHEN 5-325 MG PO TABS: 1.0000 | ORAL_TABLET | Freq: Four times a day (QID) | ORAL | Status: DC | PRN

## 2011-07-07 MED ORDER — INSULIN GLARGINE 100 UNIT/ML ~~LOC~~ SOLN: 45.0000 [IU] | Freq: Two times a day (BID) | SUBCUTANEOUS | Status: DC

## 2011-07-07 MED ORDER — INSULIN ASPART 100 UNIT/ML ~~LOC~~ SOLN: 6.0000 [IU] | Freq: Three times a day (TID) | SUBCUTANEOUS | Status: DC

## 2011-07-07 MED ORDER — INSULIN PEN NEEDLE 31G X 6 MM MISC: Status: DC

## 2011-07-07 MED ORDER — DULOXETINE HCL 20 MG PO CPEP: 20.0000 mg | ORAL_CAPSULE | Freq: Every day | ORAL | Status: DC

## 2011-07-07 MED ORDER — SPIRONOLACTONE 50 MG PO TABS: 50.0000 mg | ORAL_TABLET | Freq: Every day | ORAL | Status: DC

## 2011-07-07 MED ORDER — INSULIN ASPART 100 UNIT/ML ~~LOC~~ SOLN: 0.0000 [IU] | Freq: Three times a day (TID) | SUBCUTANEOUS | Status: DC

## 2011-07-07 MED ORDER — BUDESONIDE-FORMOTEROL FUMARATE 160-4.5 MCG/ACT IN AERO: 2.0000 | INHALATION_SPRAY | Freq: Two times a day (BID) | RESPIRATORY_TRACT | Status: DC

## 2011-07-07 NOTE — Progress Notes (Signed)
Ok for pt to take his insulin pens home upon discharge as instructed by Dr. Gwenlyn Perking.  Amanda Pea, RN

## 2011-07-07 NOTE — Progress Notes (Signed)
Utilization Review Completed.Takiyah Bohnsack T2/19/2013   

## 2011-07-07 NOTE — Discharge Summary (Signed)
Physician Discharge Summary  Patient ID: DERIC BOCOCK MRN: [id number] DOB/AGE: 08/20/1980 31 y.o.  Admit date: 07/03/2011 Discharge date: 07/07/2011  Primary Care Physician:  Sheila Oats, MD, MD   Discharge Diagnoses:   1-Non- cardiac chest pain (most likely GERD) 2-SOB (mild asthma/COPD exacerbation) 3-HTN 4-OSA 5-uncontrolled DM with hyperglycemia (A1C 10.1) 6-GERD 7-HLD 8-chronic back pain 2/2 disc herniation 9-depression 10-diabetic neuropathy   Medication List  As of 07/07/2011  2:33 PM   STOP taking these medications         docusate sodium 100 MG capsule      insulin lispro protamine-insulin lispro (75-25) 100 UNIT/ML Susp      metFORMIN 500 MG tablet      pioglitazone 15 MG tablet         TAKE these medications         aspirin 81 MG tablet   Take 81 mg by mouth daily.      beclomethasone 80 MCG/ACT inhaler   Commonly known as: QVAR   Inhale 1 puff into the lungs 2 (two) times daily.      budesonide-formoterol 160-4.5 MCG/ACT inhaler   Commonly known as: SYMBICORT   Inhale 2 puffs into the lungs 2 (two) times daily. Gargle and spit after use      buPROPion 150 MG 12 hr tablet   Commonly known as: WELLBUTRIN SR   Take 150 mg by mouth daily.      cholecalciferol 1000 UNITS tablet   Commonly known as: VITAMIN D   Take 1,000 Units by mouth 2 (two) times daily.      cyclobenzaprine 10 MG tablet   Commonly known as: FLEXERIL   Take 10 mg by mouth 3 (three) times daily as needed. For muscle spasms.      DULoxetine 20 MG capsule   Commonly known as: CYMBALTA   Take 1 capsule (20 mg total) by mouth daily.      DUONEB 0.5-2.5 (3) MG/3ML Soln   Generic drug: ipratropium-albuterol   Take 3 mLs by nebulization every 6 (six) hours as needed. For shortness of breath.      gabapentin 300 MG capsule   Commonly known as: NEURONTIN   Take 600-1,200 mg by mouth 2 (two) times daily. Take 600 mg in the morning and 1200 mg at night      insulin aspart  100 UNIT/ML injection   Commonly known as: novoLOG   Inject 6 Units into the skin 3 (three) times daily with meals.      insulin aspart 100 UNIT/ML injection   Commonly known as: novoLOG   Inject 0-15 Units into the skin 3 (three) times daily with meals. Follow discharge instruction for sliding scale information.      insulin glargine 100 UNIT/ML injection   Commonly known as: LANTUS   Inject 45 Units into the skin 2 (two) times daily.      oxyCODONE-acetaminophen 5-325 MG per tablet   Commonly known as: PERCOCET   Take 1 tablet by mouth every 6 (six) hours as needed for pain. For pain.      pravastatin 40 MG tablet   Commonly known as: PRAVACHOL   Take 40 mg by mouth daily.      PROVENTIL HFA 108 (90 BASE) MCG/ACT inhaler   Generic drug: albuterol   Inhale 2 puffs into the lungs every 6 (six) hours as needed. For shortness of breath.      ranitidine 150 MG tablet   Commonly known as: ZANTAC  Take 150 mg by mouth 2 (two) times daily.      spironolactone 50 MG tablet   Commonly known as: ALDACTONE   Take 1 tablet (50 mg total) by mouth daily.      vitamin B-1 250 MG tablet   Take 250 mg by mouth 2 (two) times daily.      vitamin C 500 MG tablet   Commonly known as: ASCORBIC ACID   Take 500 mg by mouth daily.             Disposition and Follow-up:  Patient discharge in stable and improved condition. No further CP and just mild SOB. Blood sugar under better control now and his electrolytes WNL. Patient will follow with PCP for further medication adjustment over the next 10-14 days. Will be also important to have a PFT done as an outpatient and to adjust his asthma/COPD meds if needed. Patient advised to follow a low carb diet and to lose weight. The rest of his problems remains stable and further treatment will be decided by PCP.  Consults:   None   Significant Diagnostic Studies:  Dg Chest 2 View  07/03/2011  *RADIOLOGY REPORT*  Clinical Data: Short of breath.   Chest tightness.  History of hypertension and diabetes.  CHEST - 2 VIEW  Comparison: 05/30/2011.  Findings: Chronic elevation of the right hemidiaphragm.  No airspace disease.  No effusion.  Mild right basilar atelectasis. Cardiopericardial silhouette and mediastinal contours are within normal limits.  IMPRESSION: No interval change.  No acute cardiopulmonary disease.  Elevation of the right hemidiaphragm with right basilar atelectasis.  Original Report Authenticated By: Andreas Newport, M.D.    Brief H and P: Patient is a 31 y/o male with PMH of HTN, DM, HLD, GERD, OSA and obesity; admitted secondary to chest discomfort, worsening SOB and also hyperglycemia (CBG was >500). Patient reports increase thirst and polyurea. Patient admitted for rehydration, electrolytes and to R/O ACS.   Hospital Course:  1-Non- cardiac chest pain (most likely GERD vs mild asthma exacerbation): negative CE'z X 3, no acute EKG changes for ischemia or abnormalities on telemetry or 2-D echo. Patient symptoms improved with PPI's and control of his hyperglycemic event. Patient will follow with PCP for further evaluation as an outpatient.  2-SOB (mild asthma/COPD exacerbation): CXR no suggesting infiltrates; he received nebulizer treatment and oxygen supplementation. Home inhalers restarted and symptoms controlled at discharge. No CHF findings appreciated on his 2-D echo.  3-HTN: spironolactone increased to 50mg  daily for better control of his BP; patient advised to follow low sodium diet.  4-OSA: continue CPAP at bedtime.  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 Blood sugar in the 200 range while in the hospital. Further medication adjustments per PCP.  6-GERD: continue Zantac.  7-HLD: continue pravachol  8-chronic  back pain 2/2 disc herniation:continue PRN narcotics and weight loss.  9-depression: continue wellbutrin; also cymbalta has been added  10-diabetic neuropathy: continue neurontin.  Time spent on Discharge: 40 minutes  Signed: Teirra Carapia 07/07/2011, 2:33 PM

## 2011-07-07 NOTE — Progress Notes (Signed)
All d/c instructions explained & given to pt and spouse. Verbalized understanding.  Insulin pens (novolog & lantus given as instructed by MD.  Amanda Pea, RN

## 2011-07-07 NOTE — Discharge Instructions (Signed)
Diabetes, Eating Away From Home Sometimes, you might eat in a restaurant or have meals that are prepared by someone else. You can enjoy eating out. However, the portions in restaurants may be much larger than needed. Listed below are some ideas to help you choose foods that will keep your blood glucose (sugar) in better control.  TIPS FOR EATING OUT  Know your meal plan and how many carbohydrate servings you should have at each meal. You may wish to carry a copy of your meal plan in your purse or wallet. Learn the foods included in each food group.   Make a list of restaurants near you that offer healthy choices. Take a copy of the carry-out menus to see what they offer. Then, you can plan what you will order ahead of time.   Become familiar with serving sizes by practicing them at home using measuring cups and spoons. Once you learn to recognize portion sizes, you will be able to correctly estimate the amount of total carbohydrate you are allowed to eat at the restaurant. Ask for a takeout box if the portion is more than you should have. When your food comes, leave the amount you should have on the plate, and put the rest in the takeout box before you start eating.   Plan ahead if your mealtime will be different from usual. Check with your caregiver to find out how to time meals and medicine if you are taking insulin.   Avoid high-fat foods, such as fried foods, cream sauces, high-fat salad dressings, or any added butter or margarine.   Do not be afraid to ask questions. Ask your server about the portion size, cooking methods, ingredients and if items can be substituted. Restaurants do not list all available items on the menu. You can ask for your main entree to be prepared using skim milk, oil instead of butter or margarine, and without gravy or sauces. Ask your waiter or waitress to serve salad dressings, gravy, sauces, margarine, and sour cream on the side. You can then add the amount your meal  plan suggests.   Add more vegetables whenever possible.   Avoid items that are labeled "jumbo," "giant," "deluxe," or "supersized."   You may want to split an entre with someone and order an extra side salad.   Watch for hidden calories in foods like croutons, bacon, or cheese.   Ask your server to take away the bread basket or chips from your table.   Order a dinner salad as an appetizer.  You can eat most foods served in a restaurant. Some foods are better choices than others. Breads and Starches  Recommended: All kinds of bread (wheat, rye, white, oatmeal, [place], [place], raisin), hard or soft dinner rolls, frankfurter or hamburger buns, small bagels, small corn or whole-wheat flour tortillas.   Avoid: Frosted or glazed breads, butter rolls, egg or cheese breads, croissants, sweet rolls, pastries, coffee cake, glazed or frosted doughnuts, muffins.  Crackers  Recommended: Animal crackers, graham, rye, saltine, oyster, and matzoth crackers. Bread sticks, melba toast, rusks, pretzels, popcorn (without fat), zwieback toast.   Avoid: High-fat snack crackers or chips. Buttered popcorn.  Cereals  Recommended: Hot and cold cereals. Whole grains such as oatmeal or shredded wheat are good choices.   Avoid: Sugar-coated or granola type cereals.  Potatoes/Pasta/Rice/Beans  Recommended: Order baked, boiled, or mashed potatoes, rice or noodles without added fat, whole beans. Order gravies, butter, margarine, or sauces on the side so you can control the  amount you add.   Avoid: Hash browns or fried potatoes. Potatoes, pasta, or rice prepared with cream or cheese sauce. Potato or pasta salads prepared with large amounts of dressing. Fried beans or fried rice.  Vegetables  Recommended: Order steamed, baked, boiled, or stewed vegetables without sauces or extra fat. Ask that sauce be served on the side. If vegetables are not listed on the menu, ask what is available.   Avoid: Vegetables  prepared with cream, butter, or cheese sauce. Fried vegetables.  Salad Bars  Recommended: Many of the vegetables at a salad bar are considered "free." Use lemon juice, vinegar, or low-calorie salad dressing (fewer than 20 calories per serving) as "free" dressings for your salad. Look for salad bar ingredients that have no added fat or sugar such as tomatoes, lettuce, cucumbers, broccoli, carrots, onions, and mushrooms.   Avoid: Prepared salads with large amounts of dressing, such as coleslaw, caesar salad, macaroni salad, bean salad, or carrot salad.  Fruit  Recommended: Eat fresh fruit or fresh fruit salad without added dressing. A salad bar often offers fresh fruit choices, but canned fruit at a restaurant is usually packed in sugar or syrup.   Avoid: Sweetened canned or frozen fruits, plain or sweetened fruit juice. Fruit salads with dressing, sour cream, or sugar added to them.  Meat and Meat Substitutes  Recommended: Order broiled, baked, roasted, or grilled meat, poultry, or fish. Trim off all visible fat. Do not eat the skin of poultry. The size stated on the menu is the raw weight. Meat shrinks by  in cooking (for example, 4 oz raw equals 3 oz cooked meat).   Avoid: Deep-fat fried meat, poultry, or fish. Breaded meats.  Eggs  Recommended: Order soft, hard-cooked, poached, or scrambled eggs. Omelets may be okay, depending on what ingredients are added. Egg substitutes are also a good choice.   Avoid: Fried eggs, eggs prepared with cream or cheese sauce.  Milk  Recommended: Order low-fat or fat-free milk according to your meal plan. Plain, nonfat yogurt or flavored yogurt with no sugar added may be used as a substitute for milk. Soy milk may also be used.   Avoid: Milk shakes or sweetened milk beverages.  Soups and Combination Foods  Recommended: Clear broth or consomm are "free" foods and may be used as an appetizer. Broth-based soups with fat removed count as a starch serving  and are preferred over cream soups. Soups made with beans or split peas may be eaten but count as a starch.   Avoid: Fatty soups, soup made with cream, cheese soup. Combination foods prepared with excessive amounts of fat or with cream or cheese sauces.  Desserts and Sweets  Recommended: Ask for fresh fruit. Sponge or angel food cake without icing, ice milk, no sugar added ice cream, sherbet, or frozen yogurt may fit into your meal plan occasionally.   Avoid: Pastries, puddings, pies, cakes with icing, custard, gelatin desserts.  Fats and Oils  Recommended: Choose healthy fats such as olive oil, canola oil, or tub margarine, reduced fat or fat-free sour cream, cream cheese, avocado, or nuts.   Avoid: Any fats in excess of your allowed portion. Deep-fried foods or any food with a large amount of fat.  Note: Ask for all fats to be served on the side, and limit your portion sizes according to your meal plan. Document Released: 05/04/2005 Document Revised: 01/14/2011 Document Reviewed: 11/22/2008 [facility] Patient Information 2012 [place], [place].

## 2011-07-17 ENCOUNTER — Emergency Department: Payer: Self-pay | Admitting: Emergency Medicine

## 2011-07-18 LAB — COMPREHENSIVE METABOLIC PANEL
Albumin: 3.7 g/dL (ref 3.4–5.0)
Anion Gap: 15 (ref 7–16)
BUN: 17 mg/dL (ref 7–18)
Bilirubin,Total: 0.3 mg/dL (ref 0.2–1.0)
Chloride: 96 mmol/L — ABNORMAL LOW (ref 98–107)
Creatinine: 1.1 mg/dL (ref 0.60–1.30)
EGFR (African American): >60
Glucose: 503 mg/dL (ref 65–99)
Potassium: 4.1 mmol/L (ref 3.5–5.1)
SGOT(AST): 22 U/L (ref 15–37)
Sodium: 135 mmol/L — ABNORMAL LOW (ref 136–145)
Total Protein: 8.2 g/dL (ref 6.4–8.2)

## 2011-07-18 LAB — URINALYSIS, COMPLETE
Bacteria: NONE SEEN
Bilirubin,UR: NEGATIVE
Blood: NEGATIVE
Glucose,UR: >=500 mg/dL (ref 0–75)
Ketone: NEGATIVE
Nitrite: NEGATIVE
Specific Gravity: 1.021 (ref 1.003–1.030)
Squamous Epithelial: <1
WBC UR: 5 /HPF (ref 0–5)

## 2011-07-18 LAB — CBC
HGB: 14.3 g/dL (ref 13.0–18.0)
MCV: 83 fL (ref 80–100)
Platelet: 169 10*3/uL (ref 150–440)
RBC: 5.12 10*6/uL (ref 4.40–5.90)
RDW: 14 % (ref 11.5–14.5)
WBC: 7.2 10*3/uL (ref 3.8–10.6)

## 2011-07-18 LAB — DRUG SCREEN, URINE
Barbiturates, Ur Screen: NEGATIVE (ref ?–200)
Cocaine Metabolite,Ur ~~LOC~~: NEGATIVE (ref ?–300)
MDMA (Ecstasy)Ur Screen: NEGATIVE (ref ?–500)
Methadone, Ur Screen: NEGATIVE (ref ?–300)
Phencyclidine (PCP) Ur S: NEGATIVE (ref ?–25)
Tricyclic, Ur Screen: NEGATIVE (ref ?–1000)

## 2011-07-18 LAB — TROPONIN I: Troponin-I: <0.02 ng/mL

## 2011-07-18 LAB — CK TOTAL AND CKMB (NOT AT ARMC)
CK, Total: 154 U/L (ref 35–232)
CK-MB: 0.9 ng/mL (ref 0.5–3.6)

## 2011-08-08 ENCOUNTER — Emergency Department: Payer: Self-pay | Admitting: Unknown Physician Specialty

## 2011-08-08 LAB — CBC WITH DIFFERENTIAL/PLATELET
Basophil #: 0 10*3/uL (ref 0.0–0.1)
Eosinophil %: 0.1 %
HCT: 46.6 % (ref 40.0–52.0)
HGB: 15.7 g/dL (ref 13.0–18.0)
Lymphocyte %: 15.3 %
MCH: 27.9 pg (ref 26.0–34.0)
MCV: 83 fL (ref 80–100)
Monocyte #: 1 10*3/uL — ABNORMAL HIGH (ref 0.0–0.7)
Monocyte %: 9.4 %
Neutrophil %: 75.1 %
Platelet: 155 10*3/uL (ref 150–440)
RBC: 5.63 10*6/uL (ref 4.40–5.90)
RDW: 14.4 % (ref 11.5–14.5)
WBC: 10.6 10*3/uL (ref 3.8–10.6)

## 2011-08-08 LAB — COMPREHENSIVE METABOLIC PANEL
Albumin: 4 g/dL (ref 3.4–5.0)
Alkaline Phosphatase: 76 U/L (ref 50–136)
BUN: 8 mg/dL (ref 7–18)
Glucose: 347 mg/dL — ABNORMAL HIGH (ref 65–99)
SGOT(AST): 36 U/L (ref 15–37)
SGPT (ALT): 43 U/L
Total Protein: 8.6 g/dL — ABNORMAL HIGH (ref 6.4–8.2)

## 2011-08-08 LAB — URINALYSIS, COMPLETE
Bacteria: NONE SEEN
Bilirubin,UR: NEGATIVE
Glucose,UR: >=500 mg/dL (ref 0–75)
Leukocyte Esterase: NEGATIVE
Nitrite: NEGATIVE
RBC,UR: NONE SEEN /HPF (ref 0–5)
Squamous Epithelial: <1

## 2011-08-08 LAB — MAGNESIUM: Magnesium: 1.7 mg/dL — ABNORMAL LOW

## 2011-08-09 LAB — BETA STREP CULTURE(ARMC)

## 2011-08-13 ENCOUNTER — Emergency Department: Payer: Self-pay | Admitting: Unknown Physician Specialty

## 2011-08-13 LAB — CBC WITH DIFFERENTIAL/PLATELET
Basophil #: 0 10*3/uL (ref 0.0–0.1)
Eosinophil %: 0.2 %
Lymphocyte #: 2.2 10*3/uL (ref 1.0–3.6)
Lymphocyte %: 30.4 %
MCH: 28.1 pg (ref 26.0–34.0)
MCHC: 33.5 g/dL (ref 32.0–36.0)
MCV: 84 fL (ref 80–100)
Neutrophil %: 59.8 %
RBC: 4.85 10*6/uL (ref 4.40–5.90)
RDW: 14.7 % — ABNORMAL HIGH (ref 11.5–14.5)

## 2011-08-13 LAB — URINALYSIS, COMPLETE
Bacteria: NONE SEEN
Bilirubin,UR: NEGATIVE
Glucose,UR: 150 mg/dL (ref 0–75)
Ketone: NEGATIVE
Leukocyte Esterase: NEGATIVE
Ph: 7 (ref 4.5–8.0)
Specific Gravity: 1.018 (ref 1.003–1.030)
WBC UR: 1 /HPF (ref 0–5)

## 2011-08-13 LAB — BASIC METABOLIC PANEL
BUN: 11 mg/dL (ref 7–18)
Calcium, Total: 8.1 mg/dL — ABNORMAL LOW (ref 8.5–10.1)
Co2: 27 mmol/L (ref 21–32)
EGFR (African American): >60
EGFR (Non-African Amer.): >60
Osmolality: 287 (ref 275–301)
Sodium: 142 mmol/L (ref 136–145)

## 2011-08-24 ENCOUNTER — Emergency Department: Payer: Self-pay | Admitting: Emergency Medicine

## 2011-08-24 LAB — CBC
HCT: 40.7 % (ref 40.0–52.0)
MCH: 27.8 pg (ref 26.0–34.0)
MCHC: 33 g/dL (ref 32.0–36.0)
Platelet: 168 10*3/uL (ref 150–440)
RBC: 4.84 10*6/uL (ref 4.40–5.90)
RDW: 14.4 % (ref 11.5–14.5)
WBC: 7.5 10*3/uL (ref 3.8–10.6)

## 2011-08-24 LAB — PROTIME-INR: Prothrombin Time: 12.1 secs (ref 11.5–14.7)

## 2011-08-25 LAB — COMPREHENSIVE METABOLIC PANEL
Anion Gap: 10 (ref 7–16)
BUN: 12 mg/dL (ref 7–18)
Chloride: 105 mmol/L (ref 98–107)
Co2: 24 mmol/L (ref 21–32)
Creatinine: 1.21 mg/dL (ref 0.60–1.30)
EGFR (Non-African Amer.): >60
Glucose: 217 mg/dL — ABNORMAL HIGH (ref 65–99)
Osmolality: 284 (ref 275–301)
Potassium: 3.9 mmol/L (ref 3.5–5.1)
SGPT (ALT): 30 U/L
Sodium: 139 mmol/L (ref 136–145)
Total Protein: 7.1 g/dL (ref 6.4–8.2)

## 2011-08-25 LAB — URINALYSIS, COMPLETE
Bilirubin,UR: NEGATIVE
Blood: NEGATIVE
RBC,UR: <1 /HPF (ref 0–5)
Squamous Epithelial: <1

## 2011-08-25 LAB — TROPONIN I: Troponin-I: <0.02 ng/mL

## 2011-11-14 ENCOUNTER — Encounter (HOSPITAL_COMMUNITY): Payer: Self-pay

## 2011-11-14 ENCOUNTER — Emergency Department (HOSPITAL_COMMUNITY): Payer: Medicaid Other

## 2011-11-14 ENCOUNTER — Emergency Department (HOSPITAL_COMMUNITY)
Admission: EM | Admit: 2011-11-14 | Discharge: 2011-11-15 | Disposition: A | Discharge status: Home / Self Care | Payer: Medicaid Other | Attending: Emergency Medicine | Admitting: Emergency Medicine

## 2011-11-14 DIAGNOSIS — R42 Dizziness and giddiness: Secondary | ICD-10-CM | POA: Insufficient documentation

## 2011-11-14 DIAGNOSIS — J45909 Unspecified asthma, uncomplicated: Secondary | ICD-10-CM | POA: Insufficient documentation

## 2011-11-14 DIAGNOSIS — E1142 Type 2 diabetes mellitus with diabetic polyneuropathy: Secondary | ICD-10-CM | POA: Insufficient documentation

## 2011-11-14 DIAGNOSIS — R5381 Other malaise: Secondary | ICD-10-CM | POA: Insufficient documentation

## 2011-11-14 DIAGNOSIS — H538 Other visual disturbances: Secondary | ICD-10-CM | POA: Insufficient documentation

## 2011-11-14 DIAGNOSIS — E1149 Type 2 diabetes mellitus with other diabetic neurological complication: Secondary | ICD-10-CM | POA: Insufficient documentation

## 2011-11-14 DIAGNOSIS — Z79899 Other long term (current) drug therapy: Secondary | ICD-10-CM | POA: Insufficient documentation

## 2011-11-14 DIAGNOSIS — E785 Hyperlipidemia, unspecified: Secondary | ICD-10-CM | POA: Insufficient documentation

## 2011-11-14 DIAGNOSIS — Z87442 Personal history of urinary calculi: Secondary | ICD-10-CM | POA: Insufficient documentation

## 2011-11-14 DIAGNOSIS — I10 Essential (primary) hypertension: Secondary | ICD-10-CM | POA: Insufficient documentation

## 2011-11-14 DIAGNOSIS — Z794 Long term (current) use of insulin: Secondary | ICD-10-CM | POA: Insufficient documentation

## 2011-11-14 DIAGNOSIS — F411 Generalized anxiety disorder: Secondary | ICD-10-CM | POA: Insufficient documentation

## 2011-11-14 DIAGNOSIS — K219 Gastro-esophageal reflux disease without esophagitis: Secondary | ICD-10-CM | POA: Insufficient documentation

## 2011-11-14 DIAGNOSIS — E119 Type 2 diabetes mellitus without complications: Secondary | ICD-10-CM

## 2011-11-14 LAB — COMPREHENSIVE METABOLIC PANEL
AST: 23 U/L (ref 0–37)
Albumin: 4.2 g/dL (ref 3.5–5.2)
Calcium: 10.3 mg/dL (ref 8.4–10.5)
Chloride: 90 mEq/L — ABNORMAL LOW (ref 96–112)
Creatinine, Ser: 0.93 mg/dL (ref 0.50–1.35)
Total Bilirubin: 0.6 mg/dL (ref 0.3–1.2)
Total Protein: 7.8 g/dL (ref 6.0–8.3)

## 2011-11-14 LAB — URINALYSIS, ROUTINE W REFLEX MICROSCOPIC
Glucose, UA: >1000 mg/dL — AB
Hgb urine dipstick: NEGATIVE
Leukocytes, UA: NEGATIVE
Specific Gravity, Urine: <1.005 — ABNORMAL LOW (ref 1.005–1.030)
pH: 6 (ref 5.0–8.0)

## 2011-11-14 LAB — CBC WITH DIFFERENTIAL/PLATELET
Basophils Absolute: 0 10*3/uL (ref 0.0–0.1)
Basophils Relative: 0 % (ref 0–1)
Eosinophils Absolute: 0 10*3/uL (ref 0.0–0.7)
HCT: 42.5 % (ref 39.0–52.0)
MCH: 27.6 pg (ref 26.0–34.0)
MCHC: 34.1 g/dL (ref 30.0–36.0)
Monocytes Absolute: 0.5 10*3/uL (ref 0.1–1.0)
Neutro Abs: 4.5 10*3/uL (ref 1.7–7.7)
RDW: 13.6 % (ref 11.5–15.5)

## 2011-11-14 LAB — GLUCOSE, CAPILLARY: Glucose-Capillary: 525 mg/dL — ABNORMAL HIGH (ref 70–99)

## 2011-11-14 LAB — URINE MICROSCOPIC-ADD ON

## 2011-11-14 MED ORDER — INSULIN REGULAR HUMAN 100 UNIT/ML IJ SOLN: 20.0000 [IU] | Freq: Once | INTRAMUSCULAR | Status: DC

## 2011-11-14 MED ORDER — INSULIN ASPART 100 UNIT/ML ~~LOC~~ SOLN
20.0000 [IU] | Freq: Once | SUBCUTANEOUS | Status: AC
  Administered 2011-11-14: 20 [IU] via SUBCUTANEOUS
  Filled 2011-11-14: qty 1

## 2011-11-14 MED ORDER — SODIUM CHLORIDE 0.9 % IV BOLUS (SEPSIS)
1000.0000 mL | Freq: Once | INTRAVENOUS | Status: AC
  Administered 2011-11-14: 1000 mL via INTRAVENOUS

## 2011-11-14 NOTE — ED Notes (Signed)
Blood sugar has been high, 591 the last time I checked it per pt. Feel dizzy and weak per pt. Have been taking my meds as prescribed per pt.

## 2011-11-14 NOTE — ED Provider Notes (Signed)
History   This chart was scribed for Craig Lennert, MD by Melba Coon. The patient was seen in room APA17/APA17 and the patient's care was started at 9:05PM.      CSN: [id number]  Arrival date & time 11/14/11  2027   First MD Initiated Contact with Patient 11/14/11 2101      Chief Complaint  Patient presents with  . Hyperglycemia  . Dizziness  . Weakness    (Consider location/radiation/quality/duration/timing/severity/associated sxs/prior treatment) Patient is a 31 y.o. male presenting with weakness. The history is provided by the patient. No language interpreter was used.  Weakness The primary symptoms include dizziness and visual change (blurry vision). Primary symptoms do not include headaches, fever or vomiting. The symptoms began less than 1 hour ago. The symptoms are unchanged. The neurological symptoms are diffuse. Context: high blood sugar.  He describes the dizziness as lightheadedness. The dizziness began today. The dizziness has been unchanged since its onset. It is a new problem. Dizziness also occurs with weakness. Dizziness does not occur with vomiting.  Additional symptoms include weakness. Medical issues do not include seizures.  Pt states that he was visiting his family when he felt the above symptoms. Pt went to check his blood sugar and it was 591 at home. Pt states that he has been taking his medications and was surprised that his blood sugar was so high. No known allergies to medications. No other pertinent medical symptoms.  Past Medical History  Diagnosis Date  . Hypertension   . Hyperlipidemia, mild     ldl 129 (1293/425), HDL 48, TG 27 (verified). LDL goal <130, ideally 100  . History of renal calculi   . Migraine headache   . Asthma   . Obesity   . DDD (degenerative disc disease), lumbar     Dr. Marikay Alar  . Diabetes mellitus   . Sleep apnea   . Diabetic neuropathy   . Shortness of breath   . Angina   . GERD (gastroesophageal reflux  disease)   . Anxiety   . Pneumonia 04/2009    Past Surgical History  Procedure Date  . Knee arthroscopy 2010    Right  . Tonsillectomy   . Lumbar fusion 2011  . Wisdom tooth extraction 2012    x 4  . Kidney stone surgery 2012  . Back surgery 04/30/2010    Family History  Problem Relation Age of Onset  . Hyperlipidemia Father   . Diabetes Father   . Hypertension Father   . COPD Father   . Breast cancer Paternal Grandmother   . Heart attack Paternal Grandmother 73  . Cancer Paternal Grandmother     BREAST  . Heart disease Paternal Grandmother     MI IN 20'S  . Diabetes Mother   . Hypertension Mother   . Hyperlipidemia Mother   . Heart attack Mother     x2  . Cancer Mother   . Heart disease Mother     MI x2  . Hypertension Maternal Uncle   . Kidney disease Maternal Uncle     KIDNEY FAILURE  . Diabetes Maternal Uncle   . Kidney failure Maternal Uncle   . Thyroid disease Brother   . Obesity Brother   . Hypertension Sister   . Anemia Sister   . Hyperlipidemia Sister     History  Substance Use Topics  . Smoking status: Former Smoker -- 0.5 packs/day for 7 years    Quit date: 05/18/2008  . Smokeless tobacco:  Never Used  . Alcohol Use: 0.0 oz/week    2-3 drink(s) per week      Review of Systems  Constitutional: Negative for fever.       10 Systems reviewed and are negative for acute change except as noted in the HPI.  HENT: Negative for congestion.   Eyes: Negative for discharge and redness.  Respiratory: Negative for cough and shortness of breath.   Cardiovascular: Negative for chest pain.  Gastrointestinal: Negative for vomiting and abdominal pain.  Musculoskeletal: Negative for back pain.  Skin: Negative for rash.  Neurological: Positive for dizziness and weakness. Negative for syncope, numbness and headaches.  Psychiatric/Behavioral:       No behavior change.    Allergies  Latex  Home Medications   Current Outpatient Rx  Name Route Sig  Dispense Refill  . AMITRIPTYLINE HCL 50 MG PO TABS Oral Take 50 mg by mouth at bedtime.    . ATORVASTATIN CALCIUM 40 MG PO TABS Oral Take 40 mg by mouth at bedtime.    Marland Kitchen VITAMIN D 1000 UNITS PO TABS Oral Take 1,000 Units by mouth daily.    Marland Kitchen CITALOPRAM HYDROBROMIDE 40 MG PO TABS Oral Take 40 mg by mouth daily.    . CYCLOBENZAPRINE HCL 10 MG PO TABS Oral Take 10 mg by mouth 3 (three) times daily as needed. For muscle spasms.    Marland Kitchen EXENATIDE 10 MCG/0.04ML [place] SOLN Subcutaneous Inject 10 mcg into the skin 2 (two) times daily with a meal.    . FLUTICASONE PROPIONATE 50 MCG/ACT NA SUSP Nasal Place 2 sprays into the nose daily.    Marland Kitchen FLUTICASONE PROPIONATE  HFA 44 MCG/ACT IN AERO Inhalation Inhale 2 puffs into the lungs 2 (two) times daily.    Marland Kitchen METFORMIN HCL 1000 MG PO TABS Oral Take 1,000 mg by mouth 2 (two) times daily with a meal.    . METHYLPHENIDATE HCL 5 MG PO TABS Oral Take 5 mg by mouth daily.    Marland Kitchen PANTOPRAZOLE SODIUM 40 MG PO TBEC Oral Take 40 mg by mouth daily.    Marland Kitchen PIOGLITAZONE HCL 15 MG PO TABS Oral Take 15 mg by mouth daily.    Marland Kitchen VITAMIN B2 PO Oral Take 500 mg by mouth daily.    . SUCRALFATE 1 G PO TABS Oral Take 1 g by mouth 4 (four) times daily.    . TRAMADOL HCL 50 MG PO TABS Oral Take 50 mg by mouth 2 (two) times daily.    . ALBUTEROL SULFATE HFA 108 (90 BASE) MCG/ACT IN AERS Inhalation Inhale 2 puffs into the lungs every 6 (six) hours as needed. For shortness of breath.    Marland Kitchen VITAMIN C 500 MG PO TABS Oral Take 500 mg by mouth daily.      . ASPIRIN 81 MG PO TABS Oral Take 81 mg by mouth daily.    . BECLOMETHASONE DIPROPIONATE 80 MCG/ACT IN AERS Inhalation Inhale 1 puff into the lungs 2 (two) times daily.    . BUDESONIDE-FORMOTEROL FUMARATE 160-4.5 MCG/ACT IN AERO Inhalation Inhale 2 puffs into the lungs 2 (two) times daily. Gargle and spit after use 1 Inhaler   . VITAMIN D 1000 UNITS PO TABS Oral Take 1,000 Units by mouth 2 (two) times daily.      . DULOXETINE HCL 20 MG PO CPEP Oral  Take 1 capsule (20 mg total) by mouth daily. 30 capsule 0  . GABAPENTIN 300 MG PO CAPS Oral Take 900-1,200 mg by mouth 3 (three)  times daily. Take 900mg  in the morning and 900mg  at lunch and 1200mg  at bedtime    . INSULIN ASPART 100 UNIT/ML [place] SOLN Subcutaneous Inject 0-15 Units into the skin 3 (three) times daily with meals. Follow discharge instruction for sliding scale information. 1 pen 1  . INSULIN ASPART 100 UNIT/ML [place] SOLN Subcutaneous Inject 32 Units into the skin 3 (three) times daily with meals.    . INSULIN GLARGINE 100 UNIT/ML [place] SOLN Subcutaneous Inject 60 Units into the skin 2 (two) times daily.    . INSULIN PEN NEEDLE 31G X 6 MM MISC  -1 pen needle with each application of insulin 100 each 1  . IPRATROPIUM-ALBUTEROL 0.5-2.5 (3) MG/3ML IN SOLN Nebulization Take 3 mLs by nebulization every 6 (six) hours as needed. For shortness of breath.    Marland Kitchen RANITIDINE HCL 150 MG PO TABS Oral Take 150 mg by mouth 2 (two) times daily.    Marland Kitchen SPIRONOLACTONE 50 MG PO TABS Oral Take 1 tablet (50 mg total) by mouth daily. 30 tablet 1  . VITAMIN B-1 250 MG PO TABS Oral Take 250 mg by mouth 2 (two) times daily.       BP 127/84  Pulse 95  Temp 98.5 F (36.9 C) (Oral)  Resp 18  Ht 5\' 10"  (1.778 m)  Wt 282 lb (127.914 kg)  BMI 40.46 kg/m2  SpO2 100%  Physical Exam  Nursing note and vitals reviewed. Constitutional: He is oriented to person, place, and time. He appears well-developed and well-nourished. No distress.  HENT:  Head: Normocephalic and atraumatic.  Right Ear: External ear normal.  Left Ear: External ear normal.  Eyes: EOM are normal.  Neck: Normal range of motion. No tracheal deviation present.  Cardiovascular: Normal rate, regular rhythm and normal heart sounds.   No murmur heard. Pulmonary/Chest: Effort normal and breath sounds normal. No respiratory distress. He has no wheezes. He has no rales.  Abdominal: Soft. Bowel sounds are normal. There is no tenderness.  Musculoskeletal:  Normal range of motion. He exhibits no edema and no tenderness.  Neurological: He is alert and oriented to person, place, and time.  Skin: Skin is warm and dry. No rash noted.  Psychiatric: He has a normal mood and affect. His behavior is normal.    ED Course  Procedures (including critical care time)  DIAGNOSTIC STUDIES: Oxygen Saturation is 100% on room air, normal by my interpretation.    COORDINATION OF CARE:  9:09PM - EDMD will order IV fluids, CXR, blood w/u, and UA for the pt.   Labs Reviewed  COMPREHENSIVE METABOLIC PANEL - Abnormal; Notable for the following:    Sodium 127 (*)     Chloride 90 (*)     Glucose, Bld 557 (*)     All other components within normal limits  URINALYSIS, ROUTINE W REFLEX MICROSCOPIC - Abnormal; Notable for the following:    Color, Urine STRAW (*)     Specific Gravity, Urine <1.005 (*)     Glucose, UA >1000 (*)     All other components within normal limits  GLUCOSE, CAPILLARY - Abnormal; Notable for the following:    Glucose-Capillary 525 (*)     All other components within normal limits  CBC WITH DIFFERENTIAL  URINE MICROSCOPIC-ADD ON   Dg Chest Port 1 View  11/14/2011  *RADIOLOGY REPORT*  Clinical Data: Weakness, hyperglycemia  PORTABLE CHEST - 1 VIEW  Comparison: 07/03/2011  Findings: Degraded by hypoaeration and portable technique. Elevated right hemidiaphragm and mild  dependent atelectasis. Otherwise, no focal areas of consolidation.  No pleural effusion or pneumothorax.  Cardiomediastinal contours are within normal range. No acute osseous finding.  IMPRESSION: No radiographic evidence of acute cardiopulmonary process.  Original Report Authenticated By: Waneta Martins, M.D.     No diagnosis found.    MDM   The chart was scribed for me under my direct supervision.  I personally performed the history, physical, and medical decision making and all procedures in the evaluation of this patient.Craig Lennert,  MD 11/14/11 2352

## 2011-11-14 NOTE — Discharge Instructions (Signed)
Increase your lantus  5 more units twice a day.  Follow up with your md next week.

## 2011-12-07 ENCOUNTER — Emergency Department: Payer: Self-pay | Admitting: Emergency Medicine

## 2011-12-07 LAB — COMPREHENSIVE METABOLIC PANEL
Albumin: 3.8 g/dL (ref 3.4–5.0)
Alkaline Phosphatase: 76 U/L (ref 50–136)
Calcium, Total: 9.3 mg/dL (ref 8.5–10.1)
Chloride: 100 mmol/L (ref 98–107)
Co2: 27 mmol/L (ref 21–32)
Glucose: 296 mg/dL — ABNORMAL HIGH (ref 65–99)
SGOT(AST): 30 U/L (ref 15–37)
SGPT (ALT): 57 U/L

## 2011-12-07 LAB — CBC
HGB: 12.4 g/dL — ABNORMAL LOW (ref 13.0–18.0)
MCH: 26.8 pg (ref 26.0–34.0)
MCHC: 31.7 g/dL — ABNORMAL LOW (ref 32.0–36.0)
MCV: 85 fL (ref 80–100)
Platelet: 187 10*3/uL (ref 150–440)

## 2011-12-07 LAB — URINALYSIS, COMPLETE
Bilirubin,UR: NEGATIVE
Glucose,UR: >=500 mg/dL (ref 0–75)
Ph: 7 (ref 4.5–8.0)
Protein: NEGATIVE
Squamous Epithelial: NONE SEEN
WBC UR: 1 /HPF (ref 0–5)

## 2011-12-07 LAB — LIPASE, BLOOD: Lipase: 82 U/L (ref 73–393)

## 2011-12-17 ENCOUNTER — Emergency Department: Payer: Self-pay | Admitting: *Deleted

## 2011-12-17 LAB — BASIC METABOLIC PANEL
Anion Gap: 9 (ref 7–16)
BUN: 12 mg/dL (ref 7–18)
EGFR (African American): >60
EGFR (Non-African Amer.): >60
Glucose: 369 mg/dL — ABNORMAL HIGH (ref 65–99)
Osmolality: 292 (ref 275–301)
Potassium: 4.2 mmol/L (ref 3.5–5.1)

## 2011-12-17 LAB — CK TOTAL AND CKMB (NOT AT ARMC)
CK, Total: 924 U/L — ABNORMAL HIGH (ref 35–232)
CK-MB: 1.4 ng/mL (ref 0.5–3.6)

## 2011-12-17 LAB — CBC
HCT: 33.9 % — ABNORMAL LOW (ref 40.0–52.0)
MCH: 28.6 pg (ref 26.0–34.0)
MCHC: 34.2 g/dL (ref 32.0–36.0)
Platelet: 193 10*3/uL (ref 150–440)
RBC: 4.06 10*6/uL — ABNORMAL LOW (ref 4.40–5.90)
RDW: 15.3 % — ABNORMAL HIGH (ref 11.5–14.5)
WBC: 6.6 10*3/uL (ref 3.8–10.6)

## 2011-12-17 LAB — TROPONIN I: Troponin-I: <0.02 ng/mL

## 2012-01-05 ENCOUNTER — Inpatient Hospital Stay: Payer: Self-pay | Admitting: Internal Medicine

## 2012-01-05 LAB — URINALYSIS, COMPLETE
Bacteria: NONE SEEN
Glucose,UR: >=500 mg/dL (ref 0–75)
Leukocyte Esterase: NEGATIVE
Nitrite: NEGATIVE
Ph: 6 (ref 4.5–8.0)
Protein: NEGATIVE
Squamous Epithelial: NONE SEEN

## 2012-01-05 LAB — DRUG SCREEN, URINE
Amphetamines, Ur Screen: NEGATIVE (ref ?–1000)
Benzodiazepine, Ur Scrn: NEGATIVE (ref ?–200)
Cannabinoid 50 Ng, Ur ~~LOC~~: NEGATIVE (ref ?–50)
Cocaine Metabolite,Ur ~~LOC~~: NEGATIVE (ref ?–300)
MDMA (Ecstasy)Ur Screen: NEGATIVE (ref ?–500)
Opiate, Ur Screen: NEGATIVE (ref ?–300)
Phencyclidine (PCP) Ur S: NEGATIVE (ref ?–25)

## 2012-01-05 LAB — CBC
HGB: 14.5 g/dL (ref 13.0–18.0)
MCH: 28.8 pg (ref 26.0–34.0)
Platelet: 208 10*3/uL (ref 150–440)
RBC: 5.05 10*6/uL (ref 4.40–5.90)
WBC: 7.9 10*3/uL (ref 3.8–10.6)

## 2012-01-05 LAB — COMPREHENSIVE METABOLIC PANEL
Albumin: 4 g/dL (ref 3.4–5.0)
Alkaline Phosphatase: 116 U/L (ref 50–136)
BUN: 10 mg/dL (ref 7–18)
Bilirubin,Total: 0.6 mg/dL (ref 0.2–1.0)
Calcium, Total: 9 mg/dL (ref 8.5–10.1)
Chloride: 93 mmol/L — ABNORMAL LOW (ref 98–107)
Co2: 20 mmol/L — ABNORMAL LOW (ref 21–32)
Creatinine: 1.41 mg/dL — ABNORMAL HIGH (ref 0.60–1.30)
EGFR (African American): >60
Potassium: 4.5 mmol/L (ref 3.5–5.1)
SGPT (ALT): 47 U/L (ref 12–78)
Sodium: 127 mmol/L — ABNORMAL LOW (ref 136–145)
Total Protein: 8.6 g/dL — ABNORMAL HIGH (ref 6.4–8.2)

## 2012-01-05 LAB — MAGNESIUM: Magnesium: 2 mg/dL

## 2012-01-05 LAB — CK TOTAL AND CKMB (NOT AT ARMC): CK-MB: 0.5 ng/mL (ref 0.5–3.6)

## 2012-01-05 LAB — TROPONIN I: Troponin-I: <0.02 ng/mL

## 2012-01-06 LAB — COMPREHENSIVE METABOLIC PANEL
Albumin: 3.3 g/dL — ABNORMAL LOW (ref 3.4–5.0)
Alkaline Phosphatase: 101 U/L (ref 50–136)
Anion Gap: 10 (ref 7–16)
BUN: 7 mg/dL (ref 7–18)
Bilirubin,Total: 0.5 mg/dL (ref 0.2–1.0)
Creatinine: 0.92 mg/dL (ref 0.60–1.30)
EGFR (Non-African Amer.): >60
Glucose: 355 mg/dL — ABNORMAL HIGH (ref 65–99)
Osmolality: 286 (ref 275–301)
Sodium: 137 mmol/L (ref 136–145)
Total Protein: 7 g/dL (ref 6.4–8.2)

## 2012-01-06 LAB — URINALYSIS, COMPLETE
Bilirubin,UR: NEGATIVE
Glucose,UR: >=500 mg/dL (ref 0–75)
Ketone: NEGATIVE
Leukocyte Esterase: NEGATIVE
Ph: 6 (ref 4.5–8.0)
RBC,UR: 13 /HPF (ref 0–5)
Squamous Epithelial: 1
WBC UR: 2 /HPF (ref 0–5)

## 2012-01-06 LAB — CBC WITH DIFFERENTIAL/PLATELET
Basophil #: 0 10*3/uL (ref 0.0–0.1)
Eosinophil %: 0.5 %
HCT: 37 % — ABNORMAL LOW (ref 40.0–52.0)
Lymphocyte #: 2.3 10*3/uL (ref 1.0–3.6)
MCH: 27.6 pg (ref 26.0–34.0)
MCHC: 33.3 g/dL (ref 32.0–36.0)
MCV: 83 fL (ref 80–100)
Monocyte #: 0.7 x10 3/mm (ref 0.2–1.0)
Monocyte %: 10.7 %
Neutrophil #: 3.6 10*3/uL (ref 1.4–6.5)
Neutrophil %: 53.5 %
Platelet: 178 10*3/uL (ref 150–440)
RBC: 4.47 10*6/uL (ref 4.40–5.90)
WBC: 6.7 10*3/uL (ref 3.8–10.6)

## 2012-01-06 LAB — CK TOTAL AND CKMB (NOT AT ARMC)
CK, Total: 125 U/L (ref 35–232)
CK-MB: 0.9 ng/mL (ref 0.5–3.6)
CK-MB: 0.8 ng/mL (ref 0.5–3.6)

## 2012-01-07 LAB — LIPASE, BLOOD: Lipase: 110 U/L (ref 73–393)

## 2012-02-05 ENCOUNTER — Emergency Department: Payer: Self-pay | Admitting: Emergency Medicine

## 2012-02-05 LAB — BASIC METABOLIC PANEL
Anion Gap: 10 (ref 7–16)
Calcium, Total: 9 mg/dL (ref 8.5–10.1)
Chloride: 105 mmol/L (ref 98–107)
Co2: 22 mmol/L (ref 21–32)
EGFR (African American): >60
Glucose: 286 mg/dL — ABNORMAL HIGH (ref 65–99)
Osmolality: 284 (ref 275–301)
Sodium: 137 mmol/L (ref 136–145)

## 2012-02-05 LAB — CBC
HCT: 42.7 % (ref 40.0–52.0)
HGB: 14.2 g/dL (ref 13.0–18.0)
MCHC: 33.3 g/dL (ref 32.0–36.0)
MCV: 83 fL (ref 80–100)
RDW: 14.3 % (ref 11.5–14.5)

## 2012-02-05 LAB — CK TOTAL AND CKMB (NOT AT ARMC): CK, Total: 120 U/L (ref 35–232)

## 2012-05-02 ENCOUNTER — Emergency Department: Payer: Self-pay | Admitting: Emergency Medicine

## 2012-05-02 LAB — COMPREHENSIVE METABOLIC PANEL
Albumin: 3.8 g/dL (ref 3.4–5.0)
Alkaline Phosphatase: 81 U/L (ref 50–136)
BUN: 10 mg/dL (ref 7–18)
Calcium, Total: 8.7 mg/dL (ref 8.5–10.1)
Chloride: 107 mmol/L (ref 98–107)
Co2: 25 mmol/L (ref 21–32)
Creatinine: 0.99 mg/dL (ref 0.60–1.30)
EGFR (African American): >60
EGFR (Non-African Amer.): >60
Osmolality: 280 (ref 275–301)
SGOT(AST): 16 U/L (ref 15–37)
SGPT (ALT): 28 U/L (ref 12–78)

## 2012-05-02 LAB — CBC
HCT: 40.8 % (ref 40.0–52.0)
MCHC: 32.7 g/dL (ref 32.0–36.0)
MCV: 80 fL (ref 80–100)
Platelet: 163 10*3/uL (ref 150–440)
RBC: 5.07 10*6/uL (ref 4.40–5.90)
WBC: 5.1 10*3/uL (ref 3.8–10.6)

## 2012-05-02 LAB — TROPONIN I: Troponin-I: <0.02 ng/mL

## 2012-05-03 LAB — TROPONIN I: Troponin-I: <0.02 ng/mL

## 2012-08-26 ENCOUNTER — Emergency Department: Payer: Self-pay | Admitting: Emergency Medicine

## 2012-09-22 ENCOUNTER — Inpatient Hospital Stay: Payer: Self-pay | Admitting: Internal Medicine

## 2012-09-22 LAB — URINALYSIS, COMPLETE
Bacteria: NONE SEEN
Bilirubin,UR: NEGATIVE
Leukocyte Esterase: NEGATIVE
Nitrite: NEGATIVE
Ph: 6 (ref 4.5–8.0)
Protein: NEGATIVE
RBC,UR: NONE SEEN /HPF (ref 0–5)
Specific Gravity: 1.029 (ref 1.003–1.030)
Squamous Epithelial: <1
WBC UR: 2 /HPF (ref 0–5)

## 2012-09-22 LAB — COMPREHENSIVE METABOLIC PANEL
Albumin: 4.3 g/dL (ref 3.4–5.0)
Alkaline Phosphatase: 84 U/L (ref 50–136)
Anion Gap: 12 (ref 7–16)
BUN: 12 mg/dL (ref 7–18)
Calcium, Total: 9.4 mg/dL (ref 8.5–10.1)
Chloride: 94 mmol/L — ABNORMAL LOW (ref 98–107)
Co2: 21 mmol/L (ref 21–32)
Creatinine: 1.17 mg/dL (ref 0.60–1.30)
EGFR (Non-African Amer.): >60
Osmolality: 278 (ref 275–301)
Potassium: 3.8 mmol/L (ref 3.5–5.1)
SGOT(AST): 22 U/L (ref 15–37)
Sodium: 127 mmol/L — ABNORMAL LOW (ref 136–145)
Total Protein: 8.4 g/dL — ABNORMAL HIGH (ref 6.4–8.2)

## 2012-09-22 LAB — CBC
HGB: 15.1 g/dL (ref 13.0–18.0)
MCH: 28.2 pg (ref 26.0–34.0)

## 2012-09-22 LAB — CK TOTAL AND CKMB (NOT AT ARMC): CK, Total: 144 U/L (ref 35–232)

## 2012-09-22 LAB — MAGNESIUM: Magnesium: 1.8 mg/dL

## 2012-09-22 LAB — TROPONIN I: Troponin-I: <0.02 ng/mL

## 2012-09-23 LAB — BASIC METABOLIC PANEL
Co2: 25 mmol/L (ref 21–32)
EGFR (African American): >60
Glucose: 281 mg/dL — ABNORMAL HIGH (ref 65–99)
Osmolality: 280 (ref 275–301)
Potassium: 3.7 mmol/L (ref 3.5–5.1)

## 2012-11-28 ENCOUNTER — Encounter (HOSPITAL_COMMUNITY): Payer: Self-pay | Admitting: *Deleted

## 2012-11-28 ENCOUNTER — Emergency Department (HOSPITAL_COMMUNITY)
Admission: EM | Admit: 2012-11-28 | Discharge: 2012-11-28 | Disposition: A | Discharge status: Home / Self Care | Payer: Medicaid Other | Attending: Emergency Medicine | Admitting: Emergency Medicine

## 2012-11-28 DIAGNOSIS — IMO0002 Reserved for concepts with insufficient information to code with codable children: Secondary | ICD-10-CM | POA: Insufficient documentation

## 2012-11-28 DIAGNOSIS — R630 Anorexia: Secondary | ICD-10-CM | POA: Insufficient documentation

## 2012-11-28 DIAGNOSIS — E1142 Type 2 diabetes mellitus with diabetic polyneuropathy: Secondary | ICD-10-CM | POA: Insufficient documentation

## 2012-11-28 DIAGNOSIS — E785 Hyperlipidemia, unspecified: Secondary | ICD-10-CM | POA: Insufficient documentation

## 2012-11-28 DIAGNOSIS — R05 Cough: Secondary | ICD-10-CM | POA: Insufficient documentation

## 2012-11-28 DIAGNOSIS — G43909 Migraine, unspecified, not intractable, without status migrainosus: Secondary | ICD-10-CM | POA: Insufficient documentation

## 2012-11-28 DIAGNOSIS — R110 Nausea: Secondary | ICD-10-CM | POA: Insufficient documentation

## 2012-11-28 DIAGNOSIS — Z87891 Personal history of nicotine dependence: Secondary | ICD-10-CM | POA: Insufficient documentation

## 2012-11-28 DIAGNOSIS — F411 Generalized anxiety disorder: Secondary | ICD-10-CM | POA: Insufficient documentation

## 2012-11-28 DIAGNOSIS — R739 Hyperglycemia, unspecified: Secondary | ICD-10-CM

## 2012-11-28 DIAGNOSIS — E860 Dehydration: Secondary | ICD-10-CM | POA: Insufficient documentation

## 2012-11-28 DIAGNOSIS — E1169 Type 2 diabetes mellitus with other specified complication: Secondary | ICD-10-CM | POA: Insufficient documentation

## 2012-11-28 DIAGNOSIS — Z91040 Latex allergy status: Secondary | ICD-10-CM | POA: Insufficient documentation

## 2012-11-28 DIAGNOSIS — E669 Obesity, unspecified: Secondary | ICD-10-CM | POA: Insufficient documentation

## 2012-11-28 DIAGNOSIS — R42 Dizziness and giddiness: Secondary | ICD-10-CM | POA: Insufficient documentation

## 2012-11-28 DIAGNOSIS — E1149 Type 2 diabetes mellitus with other diabetic neurological complication: Secondary | ICD-10-CM | POA: Insufficient documentation

## 2012-11-28 DIAGNOSIS — Z79899 Other long term (current) drug therapy: Secondary | ICD-10-CM | POA: Insufficient documentation

## 2012-11-28 DIAGNOSIS — Z8701 Personal history of pneumonia (recurrent): Secondary | ICD-10-CM | POA: Insufficient documentation

## 2012-11-28 DIAGNOSIS — Z794 Long term (current) use of insulin: Secondary | ICD-10-CM | POA: Insufficient documentation

## 2012-11-28 DIAGNOSIS — I10 Essential (primary) hypertension: Secondary | ICD-10-CM | POA: Insufficient documentation

## 2012-11-28 DIAGNOSIS — Z8739 Personal history of other diseases of the musculoskeletal system and connective tissue: Secondary | ICD-10-CM | POA: Insufficient documentation

## 2012-11-28 DIAGNOSIS — K219 Gastro-esophageal reflux disease without esophagitis: Secondary | ICD-10-CM | POA: Insufficient documentation

## 2012-11-28 DIAGNOSIS — Z791 Long term (current) use of non-steroidal anti-inflammatories (NSAID): Secondary | ICD-10-CM | POA: Insufficient documentation

## 2012-11-28 DIAGNOSIS — Z87442 Personal history of urinary calculi: Secondary | ICD-10-CM | POA: Insufficient documentation

## 2012-11-28 DIAGNOSIS — R059 Cough, unspecified: Secondary | ICD-10-CM | POA: Insufficient documentation

## 2012-11-28 DIAGNOSIS — J45909 Unspecified asthma, uncomplicated: Secondary | ICD-10-CM | POA: Insufficient documentation

## 2012-11-28 LAB — CBC WITH DIFFERENTIAL/PLATELET
Basophils Relative: 0 % (ref 0–1)
Eosinophils Absolute: 0 10*3/uL (ref 0.0–0.7)
Eosinophils Relative: 0 % (ref 0–5)
Hemoglobin: 15.6 g/dL (ref 13.0–17.0)
Lymphs Abs: 2.4 10*3/uL (ref 0.7–4.0)
MCH: 29 pg (ref 26.0–34.0)
MCHC: 35.2 g/dL (ref 30.0–36.0)
MCV: 82.3 fL (ref 78.0–100.0)
Monocytes Relative: 7 % (ref 3–12)
RBC: 5.38 MIL/uL (ref 4.22–5.81)

## 2012-11-28 LAB — GLUCOSE, CAPILLARY
Glucose-Capillary: 388 mg/dL — ABNORMAL HIGH (ref 70–99)
Glucose-Capillary: 314 mg/dL — ABNORMAL HIGH (ref 70–99)

## 2012-11-28 LAB — COMPREHENSIVE METABOLIC PANEL
BUN: 9 mg/dL (ref 6–23)
Calcium: 10.4 mg/dL (ref 8.4–10.5)
Creatinine, Ser: 0.86 mg/dL (ref 0.50–1.35)
GFR calc Af Amer: >90 mL/min (ref >90)
Glucose, Bld: 500 mg/dL — ABNORMAL HIGH (ref 70–99)
Total Protein: 8.7 g/dL — ABNORMAL HIGH (ref 6.0–8.3)

## 2012-11-28 MED ORDER — SODIUM CHLORIDE 0.9 % IV SOLN
1000.0000 mL | Freq: Once | INTRAVENOUS | Status: AC
  Administered 2012-11-28: 1000 mL via INTRAVENOUS

## 2012-11-28 MED ORDER — SODIUM CHLORIDE 0.9 % IV SOLN
INTRAVENOUS | Status: DC
  Administered 2012-11-28: 3.3 [IU]/h via INTRAVENOUS
  Filled 2012-11-28: qty 1

## 2012-11-28 MED ORDER — INSULIN REGULAR HUMAN 100 UNIT/ML IJ SOLN
INTRAMUSCULAR | Status: AC
  Filled 2012-11-28: qty 3

## 2012-11-28 MED ORDER — INSULIN GLARGINE 100 UNIT/ML ~~LOC~~ SOLN
100.0000 [IU] | Freq: Once | SUBCUTANEOUS | Status: AC
  Administered 2012-11-28: 100 [IU] via SUBCUTANEOUS
  Filled 2012-11-28: qty 1

## 2012-11-28 MED ORDER — SODIUM CHLORIDE 0.9 % IV SOLN
1000.0000 mL | INTRAVENOUS | Status: DC
  Administered 2012-11-28: 1000 mL via INTRAVENOUS

## 2012-11-28 NOTE — ED Notes (Signed)
Patient reports blood sugar meter reading high x5 days with increase in thirst, urinary frequency, and generalized fatigue. Per patient has had this happen before and admitted with insulin drip in past.

## 2012-11-28 NOTE — ED Notes (Signed)
CBG - 314. Increased insulin drip to 5.39ml/hr per glucostabalizer.

## 2012-11-28 NOTE — ED Provider Notes (Signed)
History    CSN: [id number] Arrival date & time 11/28/12  1650  First MD Initiated Contact with Patient 11/28/12 1834     Chief Complaint  Patient presents with  . Hyperglycemia   (Consider location/radiation/quality/duration/timing/severity/associated sxs/prior Treatment) HPI Patient relates she's had diabetes for about 12 years. He states it has been hard to control for the past 3 years and it got a lot worse the past 5 days. He states he sees a dietitian and he is very careful with what he eats. He states however he's having nausea and even the smell of food makes him feel extremely nauseated. He has not had vomiting. He has had decreased appetite recently. He denies diarrhea and does have constipation. He states it started with a sore throat and he has a dry cough and sneezing. He denies rhinorrhea. He is unsure if he is having fever. He states he has some burning when he first urinates but he denies his urine being dark or discolored. He states he feels dizzy and feels lightheaded and he is extremely fatigued. He states the past 3 days he's had a "catching" pain in his right chest it is like a jabbing sensation. He denies any abdominal pain. He states his last admission was in October 2013.  PCP at [facility]  Past Medical History  Diagnosis Date  . Hypertension   . Hyperlipidemia, mild     ldl 129 (1293/425), HDL 48, TG 27 (verified). LDL goal <130, ideally 100  . History of renal calculi   . Migraine headache   . Asthma   . Obesity   . DDD (degenerative disc disease), lumbar     Dr. Marikay Alar  . Diabetes mellitus   . Sleep apnea   . Diabetic neuropathy   . Shortness of breath   . Angina   . GERD (gastroesophageal reflux disease)   . Anxiety   . Pneumonia 04/2009   Past Surgical History  Procedure Laterality Date  . Knee arthroscopy  2010    Right  . Tonsillectomy    . Lumbar fusion  2011  . Wisdom tooth extraction  2012    x 4  . Kidney stone surgery  2012  . Back  surgery  04/30/2010   Family History  Problem Relation Age of Onset  . Hyperlipidemia Father   . Diabetes Father   . Hypertension Father   . COPD Father   . Breast cancer Paternal Grandmother   . Heart attack Paternal Grandmother 20  . Cancer Paternal Grandmother     BREAST  . Heart disease Paternal Grandmother     MI IN 43'S  . Diabetes Mother   . Hypertension Mother   . Hyperlipidemia Mother   . Heart attack Mother     x2  . Cancer Mother   . Heart disease Mother     MI x2  . Hypertension Maternal Uncle   . Kidney disease Maternal Uncle     KIDNEY FAILURE  . Diabetes Maternal Uncle   . Kidney failure Maternal Uncle   . Thyroid disease Brother   . Obesity Brother   . Hypertension Sister   . Anemia Sister   . Hyperlipidemia Sister    History  Substance Use Topics  . Smoking status: Former Smoker -- 0.50 packs/day for 7 years    Quit date: 05/18/2008  . Smokeless tobacco: Never Used  . Alcohol Use: 0.0 oz/week    2-3 drink(s) per week   on disability Was smoking  1-1/2 packs a day until he quit 4 weeks ago Drinks 2 beers 3-4 days a week  Review of Systems  All other systems reviewed and are negative.    Allergies  Latex  Home Medications    Current Outpatient Rx  Name  Route  Sig  Dispense  Refill  . albuterol (PROVENTIL HFA) 108 (90 BASE) MCG/ACT inhaler   Inhalation   Inhale 2 puffs into the lungs every 6 (six) hours as needed. For shortness of breath.         Marland Kitchen amitriptyline (ELAVIL) 50 MG tablet   Oral   Take 50 mg by mouth at bedtime.         . Ascorbic Acid (VITAMIN C) 500 MG tablet   Oral   Take 500 mg by mouth daily.           Marland Kitchen atorvastatin (LIPITOR) 40 MG tablet   Oral   Take 40 mg by mouth at bedtime.         . beclomethasone (QVAR) 80 MCG/ACT inhaler   Inhalation   Inhale 1 puff into the lungs 2 (two) times daily.         . cholecalciferol (VITAMIN D) 1000 UNITS tablet   Oral   Take 1,000 Units by mouth daily.          . citalopram (CELEXA) 40 MG tablet   Oral   Take 40 mg by mouth daily.         . cyclobenzaprine (FLEXERIL) 10 MG tablet   Oral   Take 10 mg by mouth 3 (three) times daily as needed. For muscle spasms.         . DULoxetine (CYMBALTA) 20 MG capsule   Oral   Take 1 capsule (20 mg total) by mouth daily.   30 capsule   0   . exenatide (BYETTA) 10 MCG/0.04ML SOLN   Subcutaneous   Inject 10 mcg into the skin 2 (two) times daily with a meal.         . fluticasone (FLONASE) 50 MCG/ACT nasal spray   Nasal   Place 2 sprays into the nose daily.         . fluticasone (FLOVENT HFA) 44 MCG/ACT inhaler   Inhalation   Inhale 2 puffs into the lungs 2 (two) times daily.         Marland Kitchen gabapentin (NEURONTIN) 300 MG capsule   Oral   Take 900-1,200 mg by mouth 3 (three) times daily. Take 900mg  in the morning and 900mg  at lunch and 1200mg  at bedtime         . insulin glargine (LANTUS) 100 UNIT/ML injection   Subcutaneous   Inject 60 Units into the skin 2 (two) times daily.         . insulin lispro (HUMALOG) 100 UNIT/ML injection   Subcutaneous   Inject 25 Units into the skin 3 (three) times daily before meals.         . Insulin Pen Needle 31G X 6 MM MISC      -1 pen needle with each application of insulin   100 each   1   . ipratropium-albuterol (DUONEB) 0.5-2.5 (3) MG/3ML SOLN   Nebulization   Take 3 mLs by nebulization every 6 (six) hours as needed. For shortness of breath.         . metFORMIN (GLUCOPHAGE) 1000 MG tablet   Oral   Take 1,000 mg by mouth 2 (two) times daily with a meal.         .  methylphenidate (RITALIN) 5 MG tablet   Oral   Take 10 mg by mouth daily.          . pantoprazole (PROTONIX) 40 MG tablet   Oral   Take 40 mg by mouth daily.         . pioglitazone (ACTOS) 15 MG tablet   Oral   Take 15 mg by mouth daily.         . ranitidine (ZANTAC) 150 MG tablet   Oral   Take 150 mg by mouth 2 (two) times daily.         . Riboflavin  (VITAMIN B2 PO)   Oral   Take 500 mg by mouth daily.         Marland Kitchen spironolactone (ALDACTONE) 50 MG tablet   Oral   Take 1 tablet (50 mg total) by mouth daily.   30 tablet   1   . traMADol (ULTRAM) 50 MG tablet   Oral   Take 100 mg by mouth 2 (two) times daily.          Marland Kitchen EXPIRED: insulin aspart (NOVOLOG) 100 UNIT/ML injection   Subcutaneous   Inject 32 Units into the skin 3 (three) times daily with meals.         . sildenafil (VIAGRA) 100 MG tablet   Oral   Take 100 mg by mouth as needed.         Allopurinol Chantix    BP 115/72  Pulse 76  Temp(Src) 98.5 F (36.9 C) (Oral)  Resp 18  Ht 5\' 10"  (1.778 m)  Wt 250 lb (113.399 kg)  BMI 35.87 kg/m2  SpO2 99%  Vital signs normal   Physical Exam  Nursing note and vitals reviewed. Constitutional: He is oriented to person, place, and time. He appears well-developed and well-nourished.  Non-toxic appearance. He does not appear ill. No distress.  HENT:  Head: Normocephalic and atraumatic.  Right Ear: External ear normal.  Left Ear: External ear normal.  Nose: Nose normal. No mucosal edema or rhinorrhea.  Mouth/Throat: Mucous membranes are normal. No dental abscesses or edematous.  Dry mucous membranes  Eyes: Conjunctivae and EOM are normal. Pupils are equal, round, and reactive to light.  Neck: Normal range of motion and full passive range of motion without pain. Neck supple.  Cardiovascular: Normal rate, regular rhythm and normal heart sounds.  Exam reveals no gallop and no friction rub.   No murmur heard. Pulmonary/Chest: Effort normal and breath sounds normal. No respiratory distress. He has no wheezes. He has no rhonchi. He has no rales. He exhibits no tenderness and no crepitus.  Abdominal: Soft. Normal appearance and bowel sounds are normal. He exhibits no distension. There is no tenderness. There is no rebound and no guarding.  Musculoskeletal: Normal range of motion. He exhibits no edema and no tenderness.   Moves all extremities well.   Neurological: He is alert and oriented to person, place, and time. He has normal strength. No cranial nerve deficit.  Skin: Skin is warm, dry and intact. No rash noted. No erythema. No pallor.  Psychiatric: He has a normal mood and affect. His speech is normal and behavior is normal. His mood appears not anxious.    ED Course  Procedures (including critical care time) Medications  insulin regular (NOVOLIN R,HUMULIN R) 1 Units/mL in sodium chloride 0.9 % 100 mL infusion (5.1 Units/hr Intravenous Rate/Dose Change 11/28/12 2134)  0.9 %  sodium chloride infusion (0 mLs Intravenous Stopped 11/28/12 2030)  Followed by  0.9 %  sodium chloride infusion (0 mLs Intravenous Stopped 11/28/12 2136)    Followed by  0.9 %  sodium chloride infusion (1,000 mLs Intravenous New Bag/Given 11/28/12 2136)  insulin glargine (LANTUS) injection 100 Units (not administered)   Patient was given IV fluids and started on insulin drip for his hyperglycemia.  22:00 Pt's glucose is 314, states he feel better and ready to go home, requesting to eat. Given his nighttime insulin of lantus 100 units [place].   Results for orders placed during the hospital encounter of 11/28/12  CBC WITH DIFFERENTIAL      Result Value Range   WBC 7.3  4.0 - 10.5 K/uL   RBC 5.38  4.22 - 5.81 MIL/uL   Hemoglobin 15.6  13.0 - 17.0 g/dL   HCT 78.2  95.6 - 21.3 %   MCV 82.3  78.0 - 100.0 fL   MCH 29.0  26.0 - 34.0 pg   MCHC 35.2  30.0 - 36.0 g/dL   RDW 08.6  57.8 - 46.9 %   Platelets 157  150 - 400 K/uL   Neutrophils Relative % 60  43 - 77 %   Neutro Abs 4.4  1.7 - 7.7 K/uL   Lymphocytes Relative 33  12 - 46 %   Lymphs Abs 2.4  0.7 - 4.0 K/uL   Monocytes Relative 7  3 - 12 %   Monocytes Absolute 0.5  0.1 - 1.0 K/uL   Eosinophils Relative 0  0 - 5 %   Eosinophils Absolute 0.0  0.0 - 0.7 K/uL   Basophils Relative 0  0 - 1 %   Basophils Absolute 0.0  0.0 - 0.1 K/uL  COMPREHENSIVE METABOLIC PANEL      Result  Value Range   Sodium 131 (*) 135 - 145 mEq/L   Potassium 4.4  3.5 - 5.1 mEq/L   Chloride 93 (*) 96 - 112 mEq/L   CO2 24  19 - 32 mEq/L   Glucose, Bld 500 (*) 70 - 99 mg/dL   BUN 9  6 - 23 mg/dL   Creatinine, Ser 6.29  0.50 - 1.35 mg/dL   Calcium 52.8  8.4 - 41.3 mg/dL   Total Protein 8.7 (*) 6.0 - 8.3 g/dL   Albumin 4.6  3.5 - 5.2 g/dL   AST 15  0 - 37 U/L   ALT 21  0 - 53 U/L   Alkaline Phosphatase 83  39 - 117 U/L   Total Bilirubin 0.8  0.3 - 1.2 mg/dL   GFR calc non Af Amer >90  >90 mL/min   GFR calc Af Amer >90  >90 mL/min  GLUCOSE, CAPILLARY      Result Value Range   Glucose-Capillary 388 (*) 70 - 99 mg/dL  GLUCOSE, CAPILLARY      Result Value Range   Glucose-Capillary 314 (*) 70 - 99 mg/dL   Laboratory interpretation all normal except hyperglycemia with compensatory hyponatremia   1. Hyperglycemia   2. Dehydration     Plan discharge   Devoria Albe, MD, FACEP   MDM    Ward Givens, MD 11/28/12 2209

## 2012-11-28 NOTE — ED Notes (Addendum)
Hyperglycemia, cbg hi for 5 days  .  Nausea, no vomiting.  Feels weak.

## 2012-11-29 LAB — GLUCOSE, CAPILLARY: Glucose-Capillary: >600 mg/dL (ref 70–99)

## 2013-02-10 ENCOUNTER — Emergency Department: Payer: Self-pay | Admitting: Emergency Medicine

## 2013-02-10 LAB — URINALYSIS, COMPLETE
Blood: NEGATIVE
Glucose,UR: >=500 mg/dL (ref 0–75)
Nitrite: NEGATIVE
Ph: 6 (ref 4.5–8.0)
RBC,UR: 1 /HPF (ref 0–5)
Specific Gravity: 1.02 (ref 1.003–1.030)
WBC UR: 14 /HPF (ref 0–5)

## 2013-02-10 LAB — CBC
HCT: 45.5 % (ref 40.0–52.0)
MCV: 87 fL (ref 80–100)
Platelet: 193 10*3/uL (ref 150–440)
RBC: 5.23 10*6/uL (ref 4.40–5.90)

## 2013-02-10 LAB — COMPREHENSIVE METABOLIC PANEL
Albumin: 3.8 g/dL (ref 3.4–5.0)
BUN: 12 mg/dL (ref 7–18)
Bilirubin,Total: 0.4 mg/dL (ref 0.2–1.0)
Calcium, Total: 9.1 mg/dL (ref 8.5–10.1)
Co2: 21 mmol/L (ref 21–32)
Creatinine: 1.15 mg/dL (ref 0.60–1.30)
EGFR (African American): >60
Glucose: 745 mg/dL (ref 65–99)
Osmolality: 291 (ref 275–301)
Total Protein: 8.1 g/dL (ref 6.4–8.2)

## 2013-02-20 ENCOUNTER — Emergency Department: Payer: Self-pay | Admitting: Emergency Medicine

## 2013-02-20 LAB — BASIC METABOLIC PANEL
Anion Gap: 10 (ref 7–16)
Chloride: 94 mmol/L — ABNORMAL LOW (ref 98–107)
Co2: 25 mmol/L (ref 21–32)
Creatinine: 1.1 mg/dL (ref 0.60–1.30)
EGFR (Non-African Amer.): >60
Glucose: 393 mg/dL — ABNORMAL HIGH (ref 65–99)
Potassium: 4.7 mmol/L (ref 3.5–5.1)
Sodium: 129 mmol/L — ABNORMAL LOW (ref 136–145)

## 2013-02-20 LAB — CBC
HCT: 49.7 % (ref 40.0–52.0)
HGB: 16.7 g/dL (ref 13.0–18.0)
MCHC: 33.7 g/dL (ref 32.0–36.0)
MCV: 84 fL (ref 80–100)
Platelet: 201 10*3/uL (ref 150–440)
RDW: 13.7 % (ref 11.5–14.5)
WBC: 11.5 10*3/uL — ABNORMAL HIGH (ref 3.8–10.6)

## 2013-02-20 LAB — URINALYSIS, COMPLETE: Glucose,UR: >=500 mg/dL (ref 0–75)

## 2013-02-23 ENCOUNTER — Inpatient Hospital Stay: Payer: Self-pay | Admitting: Psychiatry

## 2013-02-23 LAB — URINALYSIS, COMPLETE
Ph: 6 (ref 4.5–8.0)
Protein: NEGATIVE
Squamous Epithelial: 2
WBC UR: 21 /HPF (ref 0–5)

## 2013-02-23 LAB — DRUG SCREEN, URINE
Benzodiazepine, Ur Scrn: NEGATIVE (ref ?–200)
Cocaine Metabolite,Ur ~~LOC~~: NEGATIVE (ref ?–300)
MDMA (Ecstasy)Ur Screen: NEGATIVE (ref ?–500)
Methadone, Ur Screen: NEGATIVE (ref ?–300)
Opiate, Ur Screen: NEGATIVE (ref ?–300)
Phencyclidine (PCP) Ur S: NEGATIVE (ref ?–25)
Tricyclic, Ur Screen: POSITIVE (ref ?–1000)

## 2013-02-23 LAB — COMPREHENSIVE METABOLIC PANEL
Alkaline Phosphatase: 82 U/L (ref 50–136)
BUN: 7 mg/dL (ref 7–18)
Bilirubin,Total: 1.1 mg/dL — ABNORMAL HIGH (ref 0.2–1.0)
Calcium, Total: 9.4 mg/dL (ref 8.5–10.1)
EGFR (African American): >60
EGFR (Non-African Amer.): >60
Glucose: 426 mg/dL — ABNORMAL HIGH (ref 65–99)
Potassium: 4.5 mmol/L (ref 3.5–5.1)
SGOT(AST): 21 U/L (ref 15–37)
Total Protein: 8.1 g/dL (ref 6.4–8.2)

## 2013-02-23 LAB — TSH: Thyroid Stimulating Horm: 1.61 u[IU]/mL

## 2013-02-23 LAB — ETHANOL: Ethanol %: <0.003 % (ref 0.000–0.080)

## 2013-02-23 LAB — CBC: WBC: 5.1 10*3/uL (ref 3.8–10.6)

## 2013-02-24 LAB — HEMOGLOBIN A1C: Hemoglobin A1C: 14.4 % — ABNORMAL HIGH (ref 4.2–6.3)

## 2013-03-24 LAB — COMPREHENSIVE METABOLIC PANEL
Albumin: 4 g/dL (ref 3.4–5.0)
Anion Gap: 6 — ABNORMAL LOW (ref 7–16)
Calcium, Total: 9 mg/dL (ref 8.5–10.1)
Chloride: 101 mmol/L (ref 98–107)
Co2: 27 mmol/L (ref 21–32)
EGFR (Non-African Amer.): >60
Osmolality: 270 (ref 275–301)
Potassium: 3.6 mmol/L (ref 3.5–5.1)
SGOT(AST): 31 U/L (ref 15–37)
SGPT (ALT): 37 U/L (ref 12–78)
Sodium: 134 mmol/L — ABNORMAL LOW (ref 136–145)
Total Protein: 7.9 g/dL (ref 6.4–8.2)

## 2013-03-24 LAB — TSH: Thyroid Stimulating Horm: 1.98 u[IU]/mL

## 2013-03-24 LAB — CBC
HCT: 40 %
HGB: 13.4 g/dL
MCH: 28.5 pg
MCHC: 33.6 g/dL
MCV: 85 fL
Platelet: 185 x10 3/mm 3
RBC: 4.71 x10 6/mm 3
RDW: 14.3 %
WBC: 9 x10 3/mm 3

## 2013-03-25 ENCOUNTER — Inpatient Hospital Stay: Payer: Self-pay | Admitting: Psychiatry

## 2013-03-25 LAB — URINALYSIS, COMPLETE
Blood: NEGATIVE
Hyaline Cast: 2
Nitrite: POSITIVE
Ph: 7 (ref 4.5–8.0)
RBC,UR: 3 /HPF (ref 0–5)
Specific Gravity: 1.015 (ref 1.003–1.030)
Squamous Epithelial: 1
WBC UR: 98 /HPF (ref 0–5)

## 2013-03-25 LAB — DRUG SCREEN, URINE
Benzodiazepine, Ur Scrn: NEGATIVE (ref ?–200)
Cannabinoid 50 Ng, Ur ~~LOC~~: NEGATIVE (ref ?–50)
Tricyclic, Ur Screen: POSITIVE (ref ?–1000)

## 2013-04-02 LAB — URINALYSIS, COMPLETE
Bilirubin,UR: NEGATIVE
Blood: NEGATIVE
Ketone: NEGATIVE
Leukocyte Esterase: NEGATIVE
Ph: 7 (ref 4.5–8.0)
Protein: NEGATIVE
Specific Gravity: 1.008 (ref 1.003–1.030)
WBC UR: 1 /HPF (ref 0–5)

## 2013-06-07 ENCOUNTER — Inpatient Hospital Stay: Payer: Self-pay | Admitting: Specialist

## 2013-06-07 LAB — URINALYSIS, COMPLETE
BACTERIA: NONE SEEN
BLOOD: NEGATIVE
Bilirubin,UR: NEGATIVE
Leukocyte Esterase: NEGATIVE
Nitrite: NEGATIVE
PROTEIN: NEGATIVE
Ph: 6 (ref 4.5–8.0)
RBC,UR: NONE SEEN /HPF (ref 0–5)
SQUAMOUS EPITHELIAL: NONE SEEN
Specific Gravity: 1.024 (ref 1.003–1.030)
WBC UR: NONE SEEN /HPF (ref 0–5)

## 2013-06-07 LAB — COMPREHENSIVE METABOLIC PANEL
Albumin: 4.3 g/dL (ref 3.4–5.0)
Alkaline Phosphatase: 75 U/L
Anion Gap: 9 (ref 7–16)
BILIRUBIN TOTAL: 0.7 mg/dL (ref 0.2–1.0)
BUN: 9 mg/dL (ref 7–18)
Calcium, Total: 9.5 mg/dL (ref 8.5–10.1)
Chloride: 96 mmol/L — ABNORMAL LOW (ref 98–107)
Co2: 24 mmol/L (ref 21–32)
Creatinine: 1.14 mg/dL (ref 0.60–1.30)
Glucose: 555 mg/dL (ref 65–99)
Potassium: 3.8 mmol/L (ref 3.5–5.1)
SGOT(AST): 22 U/L (ref 15–37)
SGPT (ALT): 33 U/L (ref 12–78)
SODIUM: 129 mmol/L — AB (ref 136–145)
Total Protein: 8.3 g/dL — ABNORMAL HIGH (ref 6.4–8.2)

## 2013-06-07 LAB — TROPONIN I: Troponin-I: <0.02 ng/mL

## 2013-06-07 LAB — TSH: Thyroid Stimulating Horm: 0.732 u[IU]/mL

## 2013-06-07 LAB — CK TOTAL AND CKMB (NOT AT ARMC)
CK, Total: 98 U/L (ref 35–232)
CK-MB: 0.9 ng/mL (ref 0.5–3.6)

## 2013-06-07 LAB — CK-MB: CK-MB: 1.2 ng/mL (ref 0.5–3.6)

## 2013-06-07 LAB — CBC
HCT: 44.4 % (ref 40.0–52.0)
HGB: 14.4 g/dL (ref 13.0–18.0)
MCH: 27.6 pg (ref 26.0–34.0)
MCHC: 32.4 g/dL (ref 32.0–36.0)
MCV: 85 fL (ref 80–100)
Platelet: 175 10*3/uL (ref 150–440)
RBC: 5.22 10*6/uL (ref 4.40–5.90)
RDW: 14.5 % (ref 11.5–14.5)
WBC: 8.4 10*3/uL (ref 3.8–10.6)

## 2013-06-08 LAB — BASIC METABOLIC PANEL
ANION GAP: 6 — AB (ref 7–16)
BUN: 7 mg/dL (ref 7–18)
CO2: 29 mmol/L (ref 21–32)
CREATININE: 1.04 mg/dL (ref 0.60–1.30)
Calcium, Total: 9.1 mg/dL (ref 8.5–10.1)
Chloride: 99 mmol/L (ref 98–107)
EGFR (African American): >60
EGFR (Non-African Amer.): >60
Glucose: 373 mg/dL — ABNORMAL HIGH (ref 65–99)
Osmolality: 281 (ref 275–301)
POTASSIUM: 3.7 mmol/L (ref 3.5–5.1)
Sodium: 134 mmol/L — ABNORMAL LOW (ref 136–145)

## 2013-06-08 LAB — CK TOTAL AND CKMB (NOT AT ARMC)
CK, TOTAL: 135 U/L (ref 35–232)
CK-MB: 1.6 ng/mL (ref 0.5–3.6)

## 2013-06-30 DIAGNOSIS — G4700 Insomnia, unspecified: Secondary | ICD-10-CM | POA: Insufficient documentation

## 2013-08-20 IMAGING — CT CT HEAD WITHOUT CONTRAST
2 series · 16 of 30 positions shown, 20 images · non-contrast
Comparison: none

REASON FOR EXAM: headache
COMMENTS:

PROCEDURE:     CT  - CT HEAD WITHOUT CONTRAST  - March 22, 2011  [DATE]
RESULT:     Technique: Helical 5mm sections were obtained from the skull
base to the vertex without administration of intravenous contrast.

[Series 2: without · axial · non-contrast · 0.45mm/px · z∈[+68,+194]mm · 13 of 31 slices shown, 17 images]
[im 3/31  brain]
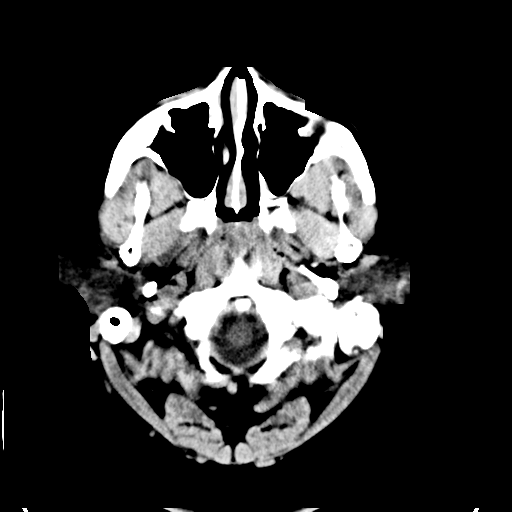
[im 3/31  bone]
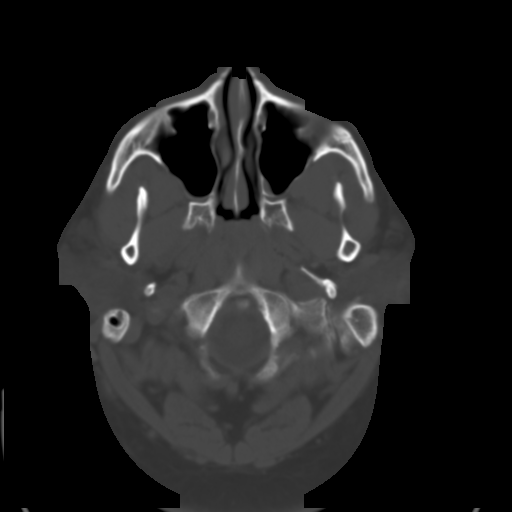
[im 5/31  brain]
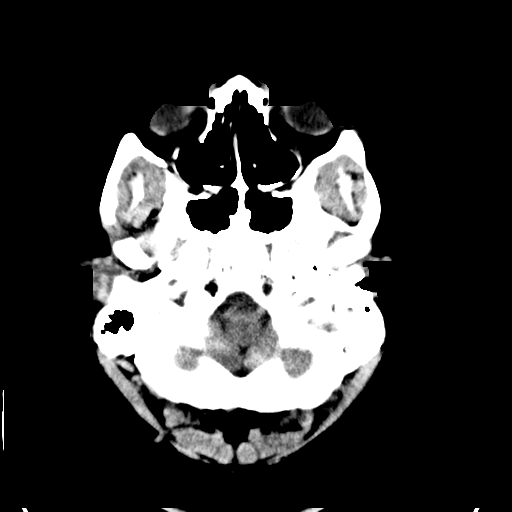
[im 7/31  brain]
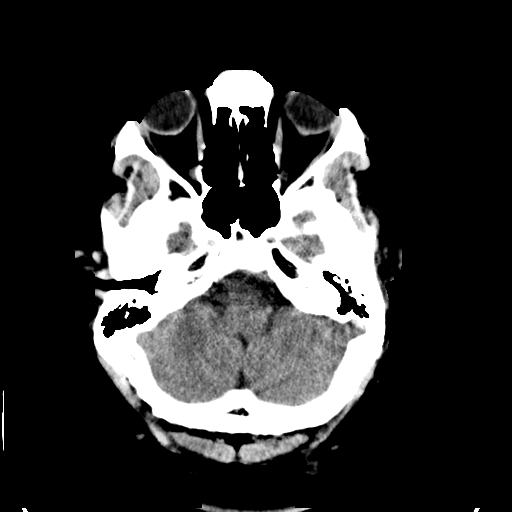
[im 9/31  brain]
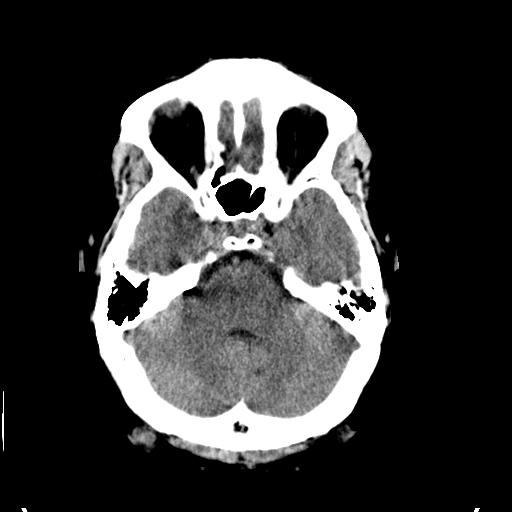
[im 11/31  brain]
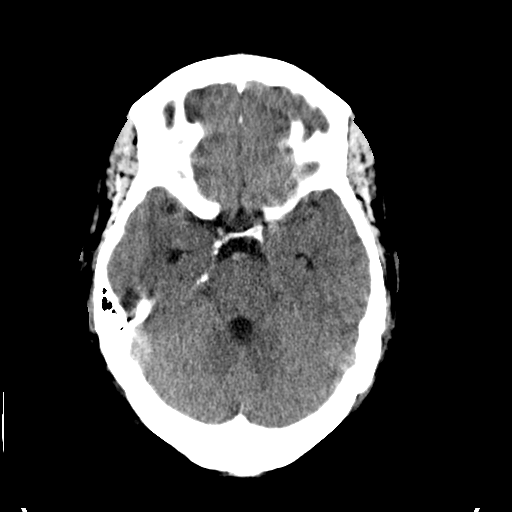
[im 11/31  bone]
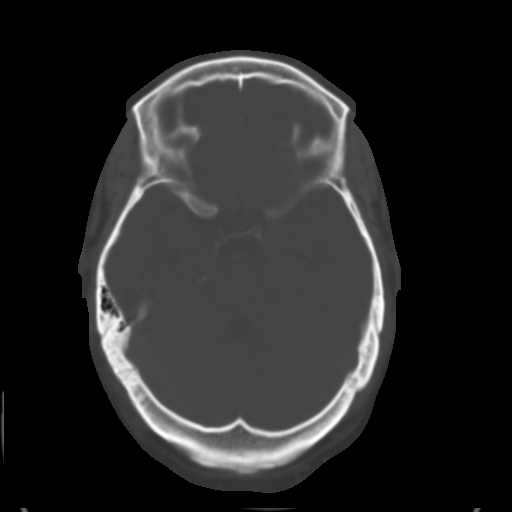
[im 13/31  brain]
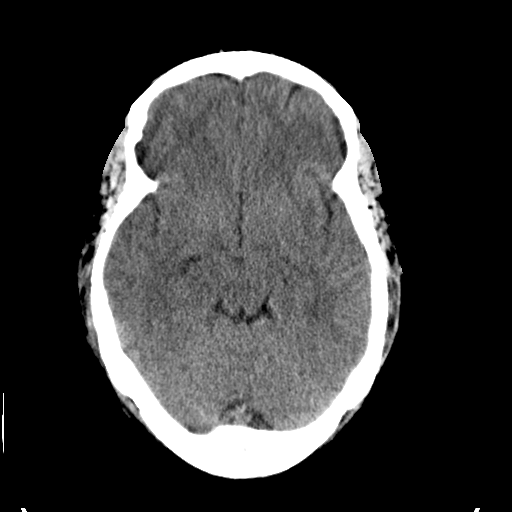
[im 16/31  brain]
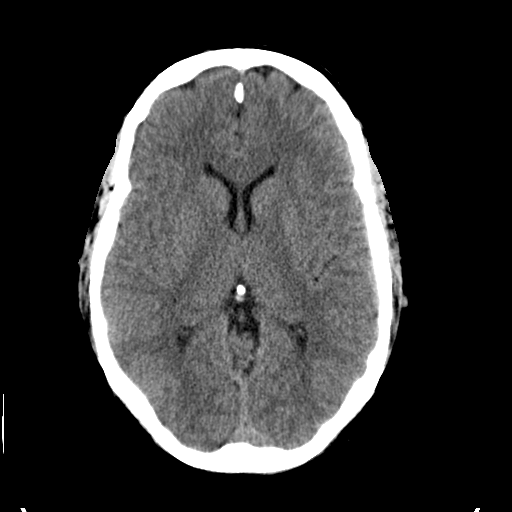
[im 18/31  brain]
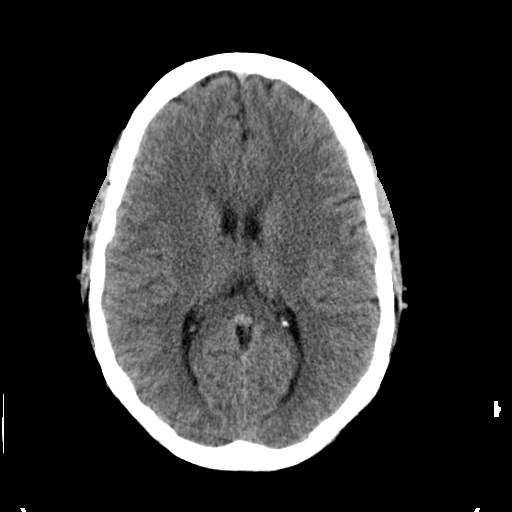
[im 20/31  brain]
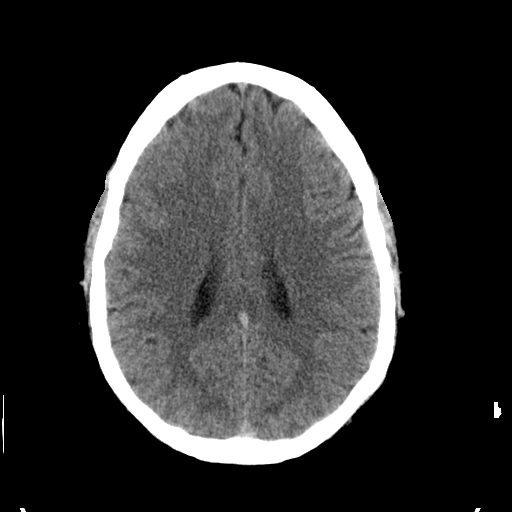
[im 20/31  bone]
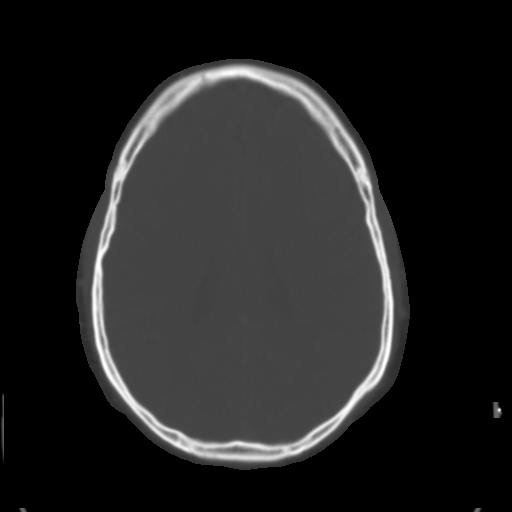
[im 22/31  brain]
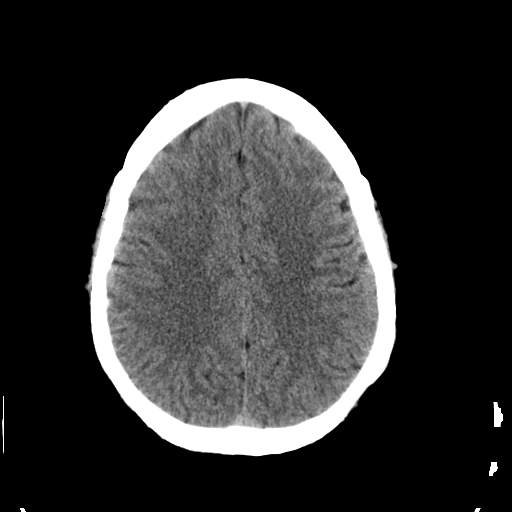
[im 24/31  brain]
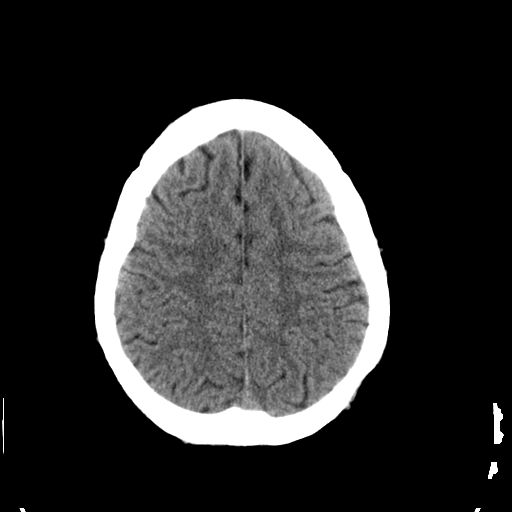
[im 26/31  brain]
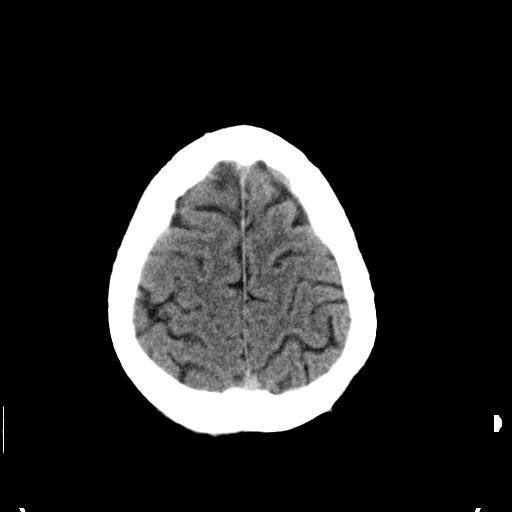
[im 28/31  brain]
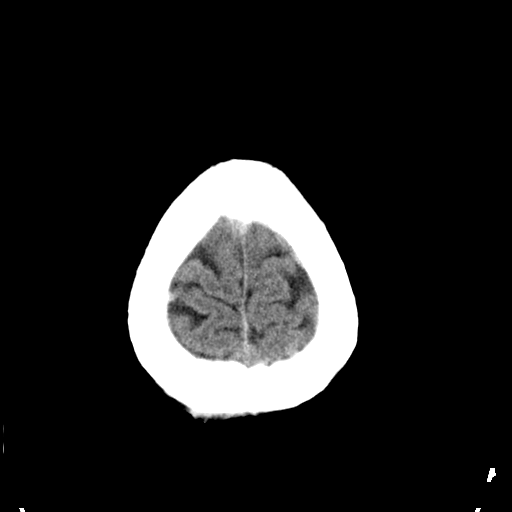
[im 28/31  bone]
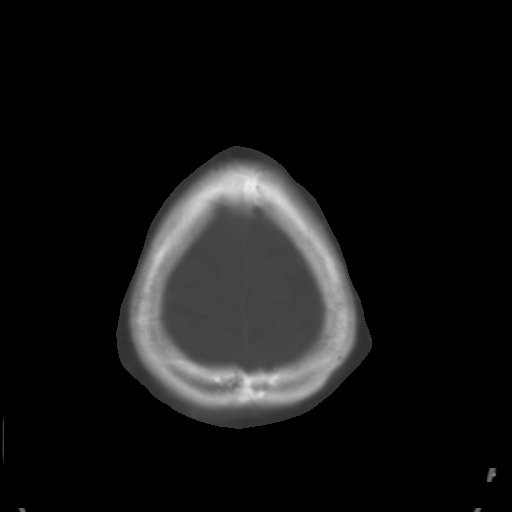

[Series 3: bone · axial · 0.45mm/px · z∈[+68,+108]mm · 3 of 31 slices shown]
[im 3/31  bone]
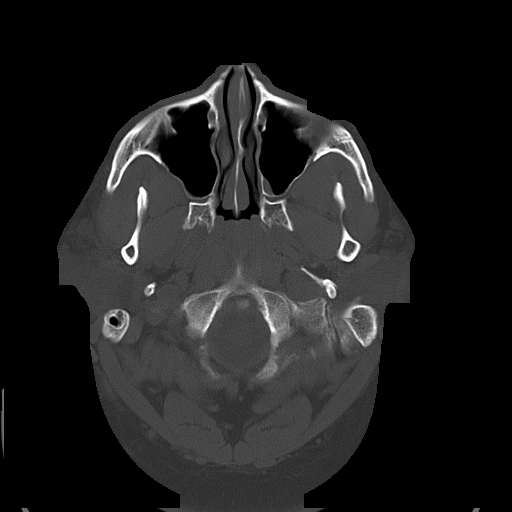
[im 7/31  bone]
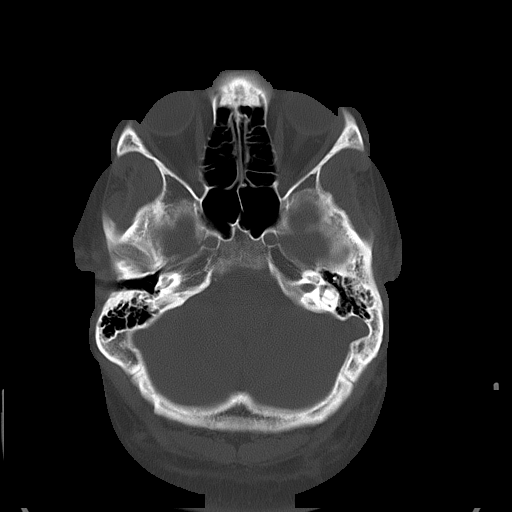
[im 11/31  bone]
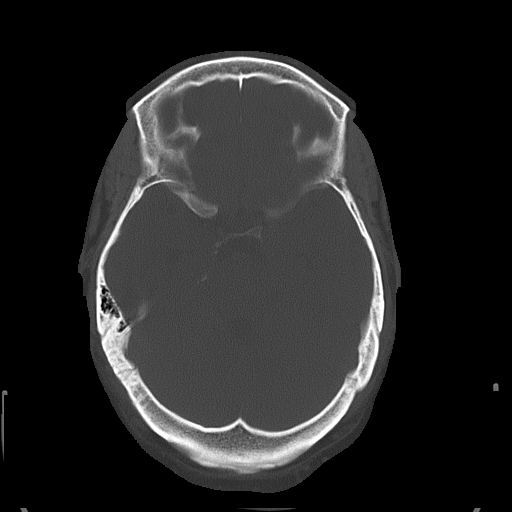

[16 of 30 positions shown; findings below may reference images not displayed]


## 2013-09-24 ENCOUNTER — Emergency Department (HOSPITAL_COMMUNITY)
Admission: EM | Admit: 2013-09-24 | Discharge: 2013-09-25 | Disposition: A | Discharge status: Home / Self Care | Payer: Medicaid Other | Attending: Emergency Medicine | Admitting: Emergency Medicine

## 2013-09-24 ENCOUNTER — Encounter (HOSPITAL_COMMUNITY): Payer: Self-pay | Admitting: Emergency Medicine

## 2013-09-24 ENCOUNTER — Emergency Department (HOSPITAL_COMMUNITY): Payer: Medicaid Other

## 2013-09-24 DIAGNOSIS — E785 Hyperlipidemia, unspecified: Secondary | ICD-10-CM | POA: Insufficient documentation

## 2013-09-24 DIAGNOSIS — R21 Rash and other nonspecific skin eruption: Secondary | ICD-10-CM | POA: Insufficient documentation

## 2013-09-24 DIAGNOSIS — K219 Gastro-esophageal reflux disease without esophagitis: Secondary | ICD-10-CM | POA: Insufficient documentation

## 2013-09-24 DIAGNOSIS — F411 Generalized anxiety disorder: Secondary | ICD-10-CM | POA: Insufficient documentation

## 2013-09-24 DIAGNOSIS — R141 Gas pain: Secondary | ICD-10-CM | POA: Insufficient documentation

## 2013-09-24 DIAGNOSIS — G43909 Migraine, unspecified, not intractable, without status migrainosus: Secondary | ICD-10-CM | POA: Insufficient documentation

## 2013-09-24 DIAGNOSIS — R079 Chest pain, unspecified: Secondary | ICD-10-CM | POA: Insufficient documentation

## 2013-09-24 DIAGNOSIS — Z8701 Personal history of pneumonia (recurrent): Secondary | ICD-10-CM | POA: Insufficient documentation

## 2013-09-24 DIAGNOSIS — E1142 Type 2 diabetes mellitus with diabetic polyneuropathy: Secondary | ICD-10-CM | POA: Insufficient documentation

## 2013-09-24 DIAGNOSIS — R42 Dizziness and giddiness: Secondary | ICD-10-CM | POA: Insufficient documentation

## 2013-09-24 DIAGNOSIS — J45901 Unspecified asthma with (acute) exacerbation: Secondary | ICD-10-CM | POA: Insufficient documentation

## 2013-09-24 DIAGNOSIS — Z87891 Personal history of nicotine dependence: Secondary | ICD-10-CM | POA: Insufficient documentation

## 2013-09-24 DIAGNOSIS — R110 Nausea: Secondary | ICD-10-CM | POA: Insufficient documentation

## 2013-09-24 DIAGNOSIS — I10 Essential (primary) hypertension: Secondary | ICD-10-CM | POA: Insufficient documentation

## 2013-09-24 DIAGNOSIS — E1149 Type 2 diabetes mellitus with other diabetic neurological complication: Secondary | ICD-10-CM | POA: Insufficient documentation

## 2013-09-24 DIAGNOSIS — R300 Dysuria: Secondary | ICD-10-CM | POA: Insufficient documentation

## 2013-09-24 DIAGNOSIS — R142 Eructation: Secondary | ICD-10-CM

## 2013-09-24 DIAGNOSIS — E669 Obesity, unspecified: Secondary | ICD-10-CM | POA: Insufficient documentation

## 2013-09-24 DIAGNOSIS — Z91040 Latex allergy status: Secondary | ICD-10-CM | POA: Insufficient documentation

## 2013-09-24 DIAGNOSIS — Z8739 Personal history of other diseases of the musculoskeletal system and connective tissue: Secondary | ICD-10-CM | POA: Insufficient documentation

## 2013-09-24 DIAGNOSIS — Z794 Long term (current) use of insulin: Secondary | ICD-10-CM | POA: Insufficient documentation

## 2013-09-24 DIAGNOSIS — IMO0002 Reserved for concepts with insufficient information to code with codable children: Secondary | ICD-10-CM | POA: Insufficient documentation

## 2013-09-24 DIAGNOSIS — R51 Headache: Secondary | ICD-10-CM | POA: Insufficient documentation

## 2013-09-24 DIAGNOSIS — M7989 Other specified soft tissue disorders: Secondary | ICD-10-CM | POA: Insufficient documentation

## 2013-09-24 DIAGNOSIS — Z79899 Other long term (current) drug therapy: Secondary | ICD-10-CM | POA: Insufficient documentation

## 2013-09-24 DIAGNOSIS — R109 Unspecified abdominal pain: Secondary | ICD-10-CM | POA: Insufficient documentation

## 2013-09-24 DIAGNOSIS — Z87442 Personal history of urinary calculi: Secondary | ICD-10-CM | POA: Insufficient documentation

## 2013-09-24 DIAGNOSIS — R140 Abdominal distension (gaseous): Secondary | ICD-10-CM

## 2013-09-24 DIAGNOSIS — R143 Flatulence: Secondary | ICD-10-CM

## 2013-09-24 LAB — CBC WITH DIFFERENTIAL/PLATELET
BASOS PCT: 0 % (ref 0–1)
Basophils Absolute: 0 10*3/uL (ref 0.0–0.1)
EOS ABS: 0.1 10*3/uL (ref 0.0–0.7)
EOS PCT: 1 % (ref 0–5)
HCT: 40.9 % (ref 39.0–52.0)
HEMOGLOBIN: 13.6 g/dL (ref 13.0–17.0)
LYMPHS ABS: 2.2 10*3/uL (ref 0.7–4.0)
Lymphocytes Relative: 26 % (ref 12–46)
MCH: 28 pg (ref 26.0–34.0)
MCHC: 33.3 g/dL (ref 30.0–36.0)
MCV: 84.2 fL (ref 78.0–100.0)
MONOS PCT: 8 % (ref 3–12)
Monocytes Absolute: 0.7 10*3/uL (ref 0.1–1.0)
NEUTROS PCT: 65 % (ref 43–77)
Neutro Abs: 5.4 10*3/uL (ref 1.7–7.7)
Platelets: 168 10*3/uL (ref 150–400)
RBC: 4.86 MIL/uL (ref 4.22–5.81)
RDW: 13.7 % (ref 11.5–15.5)
WBC: 8.4 10*3/uL (ref 4.0–10.5)

## 2013-09-24 LAB — CBG MONITORING, ED: Glucose-Capillary: 145 mg/dL — ABNORMAL HIGH (ref 70–99)

## 2013-09-24 MED ORDER — SODIUM CHLORIDE 0.9 % IV SOLN
INTRAVENOUS | Status: DC
  Administered 2013-09-24: via INTRAVENOUS

## 2013-09-24 NOTE — ED Notes (Signed)
Patient states he began having chest tightness 2 days ago with pedal edema and shortness of breath.  Patient states tightness got worse today.

## 2013-09-24 NOTE — ED Provider Notes (Signed)
CSN: [id number]     Arrival date & time 09/24/13  2254 History  This chart was scribed for Shelda Jakes, MD by Danella Maiers, ED Scribe. This patient was seen in room APA01/APA01 and the patient's care was started at 11:09 PM.   Chief Complaint  Patient presents with  . Chest Pain   Patient is a 33 y.o. male presenting with chest pain. The history is provided by the patient. No language interpreter was used.  Chest Pain Pain location:  Substernal area Pain quality: sharp   Pain radiates to:  Does not radiate Pain severity:  Moderate Duration:  2 days Timing:  Constant Associated symptoms: abdominal pain (bilateral upper), cough (dry), dizziness, headache (baseline), nausea and shortness of breath   Associated symptoms: no back pain, no fever and not vomiting    HPI Comments: Craig Rosales is a 33 y.o. male with a h/o HTN and DM who presents to the Emergency Department complaining of constant sharp substernal CP onset 2 days ago with associated SOB and bilateral pedal edema. He rates the severity of the pain as a 10/10. He denies prior h/o similar pain. He reports sharp lower back pain. He reports bilateral upper abdominal pain and feeling like his abdomen in distended. He reports redness and bumpy itchy rash to bilateral palms. He denies prior h/o similar symptoms.   PCP - [facility]  Past Medical History  Diagnosis Date  . Hypertension   . Hyperlipidemia, mild     ldl 129 (1293/425), HDL 48, TG 27 (verified). LDL goal <130, ideally 100  . History of renal calculi   . Migraine headache   . Asthma   . Obesity   . DDD (degenerative disc disease), lumbar     Dr. Marikay Alar  . Diabetes mellitus   . Sleep apnea   . Diabetic neuropathy   . Shortness of breath   . Angina   . GERD (gastroesophageal reflux disease)   . Anxiety   . Pneumonia 04/2009   Past Surgical History  Procedure Laterality Date  . Knee arthroscopy  2010    Right  . Tonsillectomy    .  Lumbar fusion  2011  . Wisdom tooth extraction  2012    x 4  . Kidney stone surgery  2012  . Back surgery  04/30/2010   Family History  Problem Relation Age of Onset  . Hyperlipidemia Father   . Diabetes Father   . Hypertension Father   . COPD Father   . Breast cancer Paternal Grandmother   . Heart attack Paternal Grandmother 31  . Cancer Paternal Grandmother     BREAST  . Heart disease Paternal Grandmother     MI IN 66'S  . Diabetes Mother   . Hypertension Mother   . Hyperlipidemia Mother   . Heart attack Mother     x2  . Cancer Mother   . Heart disease Mother     MI x2  . Hypertension Maternal Uncle   . Kidney disease Maternal Uncle     KIDNEY FAILURE  . Diabetes Maternal Uncle   . Kidney failure Maternal Uncle   . Thyroid disease Brother   . Obesity Brother   . Hypertension Sister   . Anemia Sister   . Hyperlipidemia Sister    History  Substance Use Topics  . Smoking status: Former Smoker -- 0.50 packs/day for 7 years    Quit date: 05/18/2008  . Smokeless tobacco: Never Used  .  Alcohol Use: 0.0 oz/week    2-3 drink(s) per week    Review of Systems  Constitutional: Positive for chills. Negative for fever.  HENT: Negative for rhinorrhea and sore throat.   Eyes: Negative for visual disturbance.  Respiratory: Positive for cough (dry) and shortness of breath.   Cardiovascular: Positive for chest pain and leg swelling (feet).  Gastrointestinal: Positive for nausea and abdominal pain (bilateral upper). Negative for vomiting and diarrhea (loose stools).  Genitourinary: Positive for dysuria (for one week). Negative for hematuria.  Musculoskeletal: Negative for back pain and neck pain.  Skin: Positive for rash (palms).  Neurological: Positive for dizziness and headaches (baseline).  Hematological: Does not bruise/bleed easily.  Psychiatric/Behavioral: Negative for confusion.      Allergies  Latex  Home Medications   Prior to Admission medications    Medication Sig Start Date End Date Taking? Authorizing Provider  albuterol (PROVENTIL HFA) 108 (90 BASE) MCG/ACT inhaler Inhale 2 puffs into the lungs every 6 (six) hours as needed. For shortness of breath.    Historical Provider, MD  amitriptyline (ELAVIL) 50 MG tablet Take 50 mg by mouth at bedtime.    Historical Provider, MD  Ascorbic Acid (VITAMIN C) 500 MG tablet Take 500 mg by mouth daily.      Historical Provider, MD  atorvastatin (LIPITOR) 40 MG tablet Take 40 mg by mouth at bedtime.    Historical Provider, MD  beclomethasone (QVAR) 80 MCG/ACT inhaler Inhale 1 puff into the lungs 2 (two) times daily.    Historical Provider, MD  cholecalciferol (VITAMIN D) 1000 UNITS tablet Take 1,000 Units by mouth daily.    Historical Provider, MD  citalopram (CELEXA) 40 MG tablet Take 40 mg by mouth daily.    Historical Provider, MD  cyclobenzaprine (FLEXERIL) 10 MG tablet Take 10 mg by mouth 3 (three) times daily as needed. For muscle spasms.    Historical Provider, MD  DULoxetine (CYMBALTA) 20 MG capsule Take 1 capsule (20 mg total) by mouth daily. 07/07/11 11/28/12  Vassie Loll, MD  exenatide (BYETTA) 10 MCG/0.04ML SOLN Inject 10 mcg into the skin 2 (two) times daily with a meal.    Historical Provider, MD  fluticasone (FLONASE) 50 MCG/ACT nasal spray Place 2 sprays into the nose daily.    Historical Provider, MD  fluticasone (FLOVENT HFA) 44 MCG/ACT inhaler Inhale 2 puffs into the lungs 2 (two) times daily.    Historical Provider, MD  gabapentin (NEURONTIN) 300 MG capsule Take 900-1,200 mg by mouth 3 (three) times daily. Take 900mg  in the morning and 900mg  at lunch and 1200mg  at bedtime    Historical Provider, MD  insulin aspart (NOVOLOG) 100 UNIT/ML injection Inject 32 Units into the skin 3 (three) times daily with meals. 07/07/11 07/06/12  Vassie Loll, MD  insulin glargine (LANTUS) 100 UNIT/ML injection Inject 60 Units into the skin 2 (two) times daily. 07/07/11   Vassie Loll, MD  insulin lispro  (HUMALOG) 100 UNIT/ML injection Inject 25 Units into the skin 3 (three) times daily before meals.    Historical Provider, MD  Insulin Pen Needle 31G X 6 MM MISC -1 pen needle with each application of insulin 07/07/11   Vassie Loll, MD  ipratropium-albuterol (DUONEB) 0.5-2.5 (3) MG/3ML SOLN Take 3 mLs by nebulization every 6 (six) hours as needed. For shortness of breath.    Historical Provider, MD  metFORMIN (GLUCOPHAGE) 1000 MG tablet Take 1,000 mg by mouth 2 (two) times daily with a meal.    Historical Provider, MD  methylphenidate (RITALIN) 5 MG tablet Take 10 mg by mouth daily.     Historical Provider, MD  pantoprazole (PROTONIX) 40 MG tablet Take 40 mg by mouth daily.    Historical Provider, MD  pioglitazone (ACTOS) 15 MG tablet Take 15 mg by mouth daily.    Historical Provider, MD  ranitidine (ZANTAC) 150 MG tablet Take 150 mg by mouth 2 (two) times daily.    Historical Provider, MD  Riboflavin (VITAMIN B2 PO) Take 500 mg by mouth daily.    Historical Provider, MD  sildenafil (VIAGRA) 100 MG tablet Take 100 mg by mouth as needed.    Historical Provider, MD  spironolactone (ALDACTONE) 50 MG tablet Take 1 tablet (50 mg total) by mouth daily. 07/07/11   Vassie Loll, MD  traMADol (ULTRAM) 50 MG tablet Take 100 mg by mouth 2 (two) times daily.     Historical Provider, MD   BP 127/82  Pulse 100  Temp(Src) 98.2 F (36.8 C) (Oral)  Resp 24  Ht 5\' 10"  (1.778 m)  Wt 301 lb (136.533 kg)  BMI 43.19 kg/m2  SpO2 95% Physical Exam  Nursing note and vitals reviewed. Constitutional: He is oriented to person, place, and time. He appears well-developed and well-nourished. No distress.  HENT:  Head: Normocephalic and atraumatic.  Eyes: EOM are normal.  Neck: Neck supple. No tracheal deviation present.  Cardiovascular: Normal rate and regular rhythm.   Pulmonary/Chest: Effort normal and breath sounds normal. No respiratory distress. He has no wheezes.  Abdominal: Bowel sounds are normal. He  exhibits distension. There is no tenderness.  Musculoskeletal: Normal range of motion.  Redness to both palms, seems to be swollen, does blanch. Some papules no vesicles  Neurological: He is alert and oriented to person, place, and time.  Skin: Skin is warm and dry.  Psychiatric: He has a normal mood and affect. His behavior is normal.    ED Course  Procedures (including critical care time) Medications  0.9 %  sodium chloride infusion ( Intravenous New Bag/Given 09/24/13 2348)  sodium chloride 0.9 % bolus 1,000 mL (not administered)  ondansetron (ZOFRAN) injection 4 mg (4 mg Intravenous Given 09/25/13 0143)  HYDROmorphone (DILAUDID) injection 1 mg (1 mg Intravenous Given 09/25/13 0143)    DIAGNOSTIC STUDIES: Oxygen Saturation is 95% on RA, normal by my interpretation.    COORDINATION OF CARE: 11:29 PM- Discussed treatment plan with pt. Pt agrees to plan.    Labs Review Labs Reviewed  COMPREHENSIVE METABOLIC PANEL - Abnormal; Notable for the following:    Glucose, Bld 143 (*)    Creatinine, Ser 1.52 (*)    AST 38 (*)    GFR calc non Af Amer 59 (*)    GFR calc Af Amer 68 (*)    All other components within normal limits  URINALYSIS, ROUTINE W REFLEX MICROSCOPIC - Abnormal; Notable for the following:    Specific Gravity, Urine >1.030 (*)    All other components within normal limits  CBG MONITORING, ED - Abnormal; Notable for the following:    Glucose-Capillary 145 (*)    All other components within normal limits  TROPONIN I  PRO B NATRIURETIC PEPTIDE  LIPASE, BLOOD  CBC WITH DIFFERENTIAL   Results for orders placed during the hospital encounter of 09/24/13  TROPONIN I      Result Value Ref Range   Troponin I <0.30  <0.30 ng/mL  PRO B NATRIURETIC PEPTIDE      Result Value Ref Range   Pro B Natriuretic  peptide (BNP) <5.0  0 - 125 pg/mL  COMPREHENSIVE METABOLIC PANEL      Result Value Ref Range   Sodium 138  137 - 147 mEq/L   Potassium 4.0  3.7 - 5.3 mEq/L   Chloride 100   96 - 112 mEq/L   CO2 29  19 - 32 mEq/L   Glucose, Bld 143 (*) 70 - 99 mg/dL   BUN 13  6 - 23 mg/dL   Creatinine, Ser 1.61 (*) 0.50 - 1.35 mg/dL   Calcium 9.6  8.4 - 09.6 mg/dL   Total Protein 7.1  6.0 - 8.3 g/dL   Albumin 3.7  3.5 - 5.2 g/dL   AST 38 (*) 0 - 37 U/L   ALT 39  0 - 53 U/L   Alkaline Phosphatase 61  39 - 117 U/L   Total Bilirubin 0.4  0.3 - 1.2 mg/dL   GFR calc non Af Amer 59 (*) >90 mL/min   GFR calc Af Amer 68 (*) >90 mL/min  LIPASE, BLOOD      Result Value Ref Range   Lipase 19  11 - 59 U/L  CBC WITH DIFFERENTIAL      Result Value Ref Range   WBC 8.4  4.0 - 10.5 K/uL   RBC 4.86  4.22 - 5.81 MIL/uL   Hemoglobin 13.6  13.0 - 17.0 g/dL   HCT 04.5  40.9 - 81.1 %   MCV 84.2  78.0 - 100.0 fL   MCH 28.0  26.0 - 34.0 pg   MCHC 33.3  30.0 - 36.0 g/dL   RDW 91.4  78.2 - 95.6 %   Platelets 168  150 - 400 K/uL   Neutrophils Relative % 65  43 - 77 %   Neutro Abs 5.4  1.7 - 7.7 K/uL   Lymphocytes Relative 26  12 - 46 %   Lymphs Abs 2.2  0.7 - 4.0 K/uL   Monocytes Relative 8  3 - 12 %   Monocytes Absolute 0.7  0.1 - 1.0 K/uL   Eosinophils Relative 1  0 - 5 %   Eosinophils Absolute 0.1  0.0 - 0.7 K/uL   Basophils Relative 0  0 - 1 %   Basophils Absolute 0.0  0.0 - 0.1 K/uL  URINALYSIS, ROUTINE W REFLEX MICROSCOPIC      Result Value Ref Range   Color, Urine YELLOW  YELLOW   APPearance CLEAR  CLEAR   Specific Gravity, Urine >1.030 (*) 1.005 - 1.030   pH 6.0  5.0 - 8.0   Glucose, UA NEGATIVE  NEGATIVE mg/dL   Hgb urine dipstick NEGATIVE  NEGATIVE   Bilirubin Urine NEGATIVE  NEGATIVE   Ketones, ur NEGATIVE  NEGATIVE mg/dL   Protein, ur NEGATIVE  NEGATIVE mg/dL   Urobilinogen, UA 0.2  0.0 - 1.0 mg/dL   Nitrite NEGATIVE  NEGATIVE   Leukocytes, UA NEGATIVE  NEGATIVE  CBG MONITORING, ED      Result Value Ref Range   Glucose-Capillary 145 (*) 70 - 99 mg/dL   Results for orders placed during the hospital encounter of 09/24/13  TROPONIN I      Result Value Ref Range    Troponin I <0.30  <0.30 ng/mL  PRO B NATRIURETIC PEPTIDE      Result Value Ref Range   Pro B Natriuretic peptide (BNP) <5.0  0 - 125 pg/mL  COMPREHENSIVE METABOLIC PANEL      Result Value Ref Range   Sodium 138  137 -  147 mEq/L   Potassium 4.0  3.7 - 5.3 mEq/L   Chloride 100  96 - 112 mEq/L   CO2 29  19 - 32 mEq/L   Glucose, Bld 143 (*) 70 - 99 mg/dL   BUN 13  6 - 23 mg/dL   Creatinine, Ser 0.981.52 (*) 0.50 - 1.35 mg/dL   Calcium 9.6  8.4 - 11.910.5 mg/dL   Total Protein 7.1  6.0 - 8.3 g/dL   Albumin 3.7  3.5 - 5.2 g/dL   AST 38 (*) 0 - 37 U/L   ALT 39  0 - 53 U/L   Alkaline Phosphatase 61  39 - 117 U/L   Total Bilirubin 0.4  0.3 - 1.2 mg/dL   GFR calc non Af Amer 59 (*) >90 mL/min   GFR calc Af Amer 68 (*) >90 mL/min  LIPASE, BLOOD      Result Value Ref Range   Lipase 19  11 - 59 U/L  CBC WITH DIFFERENTIAL      Result Value Ref Range   WBC 8.4  4.0 - 10.5 K/uL   RBC 4.86  4.22 - 5.81 MIL/uL   Hemoglobin 13.6  13.0 - 17.0 g/dL   HCT 14.740.9  82.939.0 - 56.252.0 %   MCV 84.2  78.0 - 100.0 fL   MCH 28.0  26.0 - 34.0 pg   MCHC 33.3  30.0 - 36.0 g/dL   RDW 13.013.7  86.511.5 - 78.415.5 %   Platelets 168  150 - 400 K/uL   Neutrophils Relative % 65  43 - 77 %   Neutro Abs 5.4  1.7 - 7.7 K/uL   Lymphocytes Relative 26  12 - 46 %   Lymphs Abs 2.2  0.7 - 4.0 K/uL   Monocytes Relative 8  3 - 12 %   Monocytes Absolute 0.7  0.1 - 1.0 K/uL   Eosinophils Relative 1  0 - 5 %   Eosinophils Absolute 0.1  0.0 - 0.7 K/uL   Basophils Relative 0  0 - 1 %   Basophils Absolute 0.0  0.0 - 0.1 K/uL  URINALYSIS, ROUTINE W REFLEX MICROSCOPIC      Result Value Ref Range   Color, Urine YELLOW  YELLOW   APPearance CLEAR  CLEAR   Specific Gravity, Urine >1.030 (*) 1.005 - 1.030   pH 6.0  5.0 - 8.0   Glucose, UA NEGATIVE  NEGATIVE mg/dL   Hgb urine dipstick NEGATIVE  NEGATIVE   Bilirubin Urine NEGATIVE  NEGATIVE   Ketones, ur NEGATIVE  NEGATIVE mg/dL   Protein, ur NEGATIVE  NEGATIVE mg/dL   Urobilinogen, UA 0.2  0.0 -  1.0 mg/dL   Nitrite NEGATIVE  NEGATIVE   Leukocytes, UA NEGATIVE  NEGATIVE  TROPONIN I      Result Value Ref Range   Troponin I <0.30  <0.30 ng/mL  CBG MONITORING, ED      Result Value Ref Range   Glucose-Capillary 145 (*) 70 - 99 mg/dL     Imaging Review Dg Chest 2 View  09/25/2013   CLINICAL DATA:  Shortness of breath  EXAM: CHEST  2 VIEW  COMPARISON:  11/14/2011  FINDINGS: Cardiac shadow is within normal limits. The lungs are well aerated bilaterally. Elevation of the right hemidiaphragm is again seen. Very minimal right basilar atelectasis is noted. No focal confluent infiltrate is seen. No bony abnormality is noted.  IMPRESSION: Minimal right basilar atelectasis.   Electronically Signed   By: Eulah PontMark  Lukens M.D.  On: 09/25/2013 00:49     EKG Interpretation   Date/Time:  Sunday Sep 24 2013 [id number]:10:[id number]:[id number]:24 EDT Ventricular Rate:  102 PR Interval:  161 QRS Duration: 91 QT Interval:  344 QTC Calculation: 448 R Axis:   69 Text Interpretation:  Sinus tachycardia Baseline wander in lead(s) V2 No  significant change since last tracing Confirmed by Atalie Oros  MD, Kort Stettler  ([id number]) on 09/24/2013 11:14:16 PM      MDM   Final diagnoses:  Chest pain  Abdominal distention    Patient with several complaints. 1 including chest pain for 2-3 days. Also with swelling of the legs and the abdomen and shortness of breath. Workup shows no acute cardiac event troponins x2 were negative EKG without any acute changes. Chest x-ray shows no evidence of pulmonary edema pneumonia or pneumothorax. CT scan of the abdomen shows no acute abdominal findings. No evidence of ascites. There is some liquid stool but no evidence of obstruction. There is an umbilical hernia but no evidence of incarceration.  I personally performed the services described in this documentation, which was scribed in my presence. The recorded information has been reviewed and is accurate.      Shelda JakesScott W. Alianis Trimmer, MD 09/25/13 0400

## 2013-09-25 ENCOUNTER — Emergency Department (HOSPITAL_COMMUNITY): Payer: Medicaid Other

## 2013-09-25 LAB — LIPASE, BLOOD: Lipase: 19 U/L (ref 11–59)

## 2013-09-25 LAB — COMPREHENSIVE METABOLIC PANEL
ALK PHOS: 61 U/L (ref 39–117)
ALT: 39 U/L (ref 0–53)
AST: 38 U/L — AB (ref 0–37)
Albumin: 3.7 g/dL (ref 3.5–5.2)
BUN: 13 mg/dL (ref 6–23)
CO2: 29 meq/L (ref 19–32)
Calcium: 9.6 mg/dL (ref 8.4–10.5)
Chloride: 100 mEq/L (ref 96–112)
Creatinine, Ser: 1.52 mg/dL — ABNORMAL HIGH (ref 0.50–1.35)
GFR, EST AFRICAN AMERICAN: 68 mL/min — AB (ref >90)
GFR, EST NON AFRICAN AMERICAN: 59 mL/min — AB (ref >90)
GLUCOSE: 143 mg/dL — AB (ref 70–99)
POTASSIUM: 4 meq/L (ref 3.7–5.3)
SODIUM: 138 meq/L (ref 137–147)
Total Bilirubin: 0.4 mg/dL (ref 0.3–1.2)
Total Protein: 7.1 g/dL (ref 6.0–8.3)

## 2013-09-25 LAB — TROPONIN I
Troponin I: <0.3 ng/mL (ref <0.30)
Troponin I: <0.3 ng/mL (ref <0.30)

## 2013-09-25 LAB — URINALYSIS, ROUTINE W REFLEX MICROSCOPIC
BILIRUBIN URINE: NEGATIVE
GLUCOSE, UA: NEGATIVE mg/dL
HGB URINE DIPSTICK: NEGATIVE
KETONES UR: NEGATIVE mg/dL
Leukocytes, UA: NEGATIVE
Nitrite: NEGATIVE
PROTEIN: NEGATIVE mg/dL
Specific Gravity, Urine: >1.03 — ABNORMAL HIGH (ref 1.005–1.030)
UROBILINOGEN UA: 0.2 mg/dL (ref 0.0–1.0)
pH: 6 (ref 5.0–8.0)

## 2013-09-25 LAB — PRO B NATRIURETIC PEPTIDE

## 2013-09-25 MED ORDER — IOHEXOL 300 MG/ML  SOLN
100.0000 mL | Freq: Once | INTRAMUSCULAR | Status: AC | PRN
  Administered 2013-09-25: 100 mL via INTRAVENOUS

## 2013-09-25 MED ORDER — HYDROMORPHONE HCL PF 1 MG/ML IJ SOLN
1.0000 mg | Freq: Once | INTRAMUSCULAR | Status: AC
  Administered 2013-09-25: 1 mg via INTRAVENOUS
  Filled 2013-09-25: qty 1

## 2013-09-25 MED ORDER — ONDANSETRON 4 MG PO TBDP: 4.0000 mg | ORAL_TABLET | Freq: Three times a day (TID) | ORAL | Status: DC | PRN

## 2013-09-25 MED ORDER — PROMETHAZINE HCL 25 MG PO TABS: 25.0000 mg | ORAL_TABLET | Freq: Four times a day (QID) | ORAL | Status: DC | PRN

## 2013-09-25 MED ORDER — SODIUM CHLORIDE 0.9 % IV BOLUS (SEPSIS): 1000.0000 mL | Freq: Once | INTRAVENOUS | Status: DC

## 2013-09-25 MED ORDER — HYDROCODONE-ACETAMINOPHEN 5-325 MG PO TABS: 1.0000 | ORAL_TABLET | Freq: Four times a day (QID) | ORAL | Status: DC | PRN

## 2013-09-25 MED ORDER — IOHEXOL 300 MG/ML  SOLN
50.0000 mL | Freq: Once | INTRAMUSCULAR | Status: AC | PRN
  Administered 2013-09-25: 50 mL via ORAL

## 2013-09-25 MED ORDER — ONDANSETRON HCL 4 MG/2ML IJ SOLN
4.0000 mg | Freq: Once | INTRAMUSCULAR | Status: AC
  Administered 2013-09-25: 4 mg via INTRAVENOUS
  Filled 2013-09-25: qty 2

## 2013-09-25 NOTE — Discharge Instructions (Signed)
Followup with your doctor at [facility]. Today's workup shows no significant abnormalities in the abdomen. You do have a small umbilical hernia without any bowel trapped in it. Cardiac workup negative for any acute cardiac problems. No fluid on the lungs. Take medications as directed at 2 different types of medications for her nausea and vomiting and one for pain.

## 2013-10-26 IMAGING — CR DG CHEST 2V
1 series · 2 of 2 positions shown · non-contrast
Comparison: none

REASON FOR EXAM: sob
COMMENTS:

[Series 1: pa · 0.17mm/px · 2 of 2 slices shown]
[im 1/2]
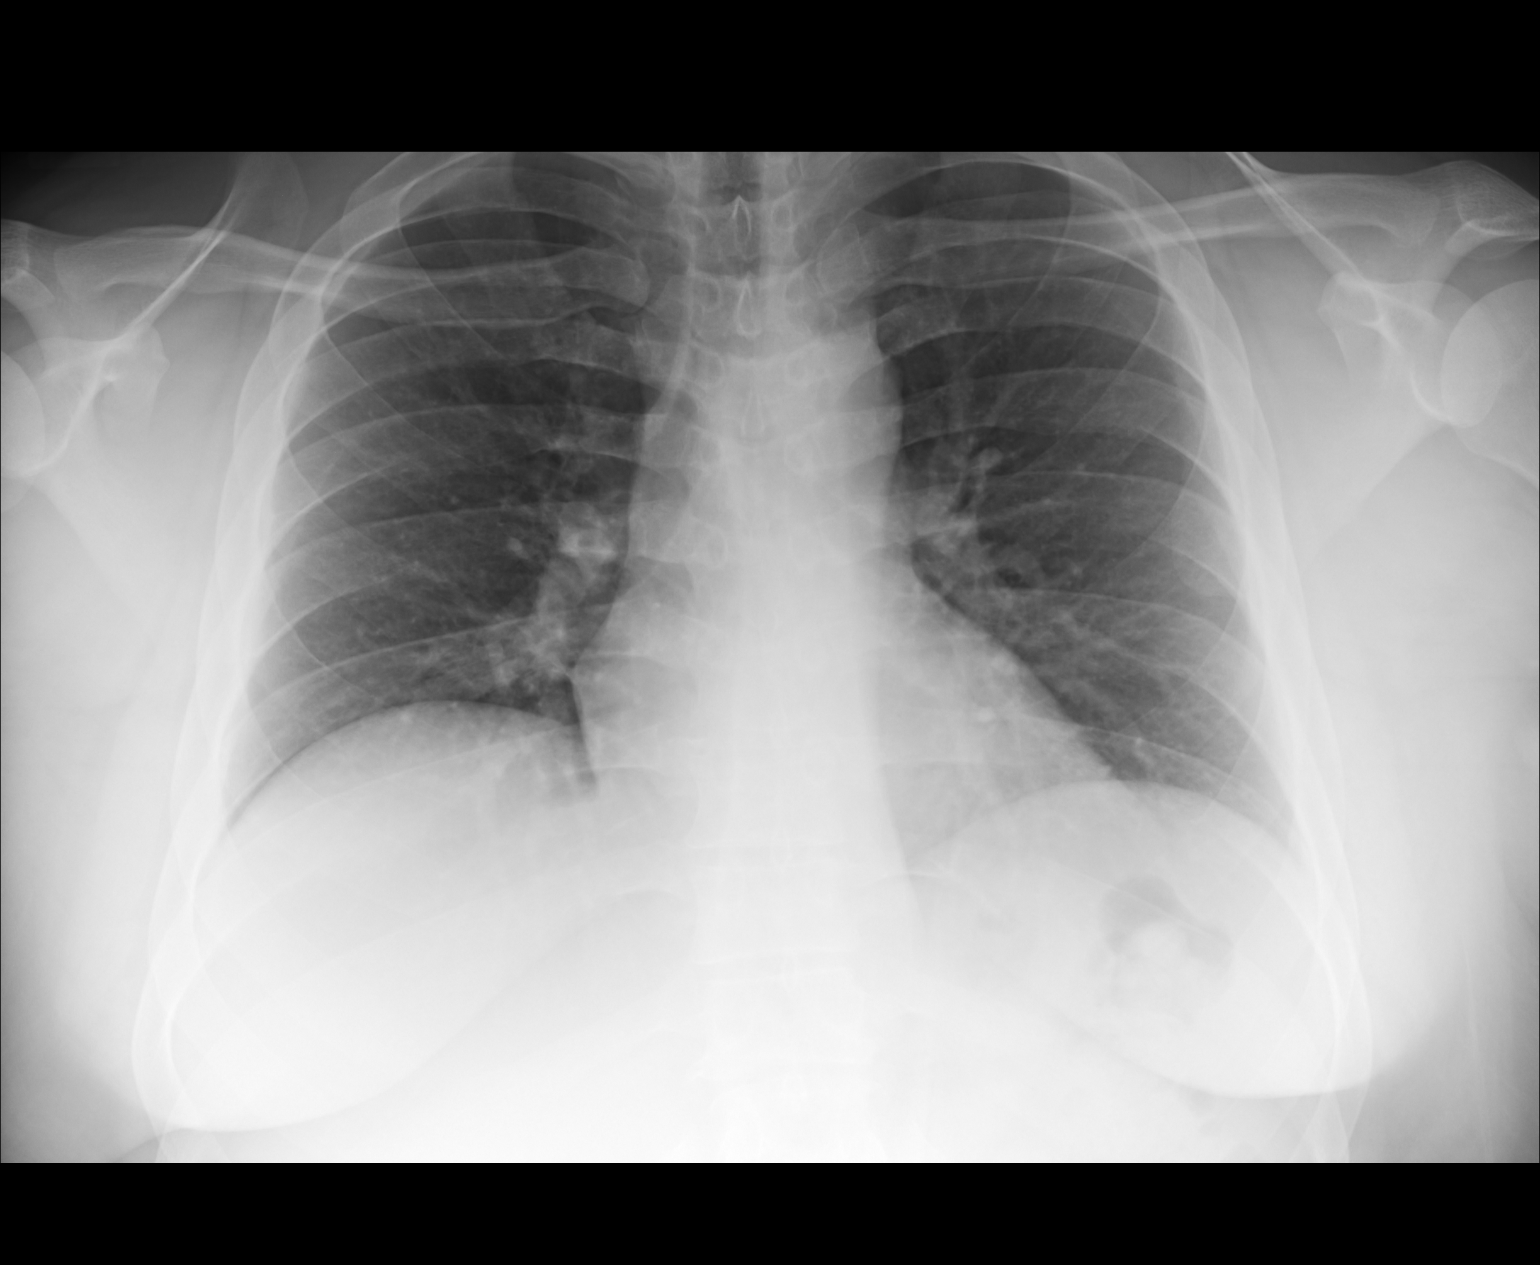
[im 2/2]
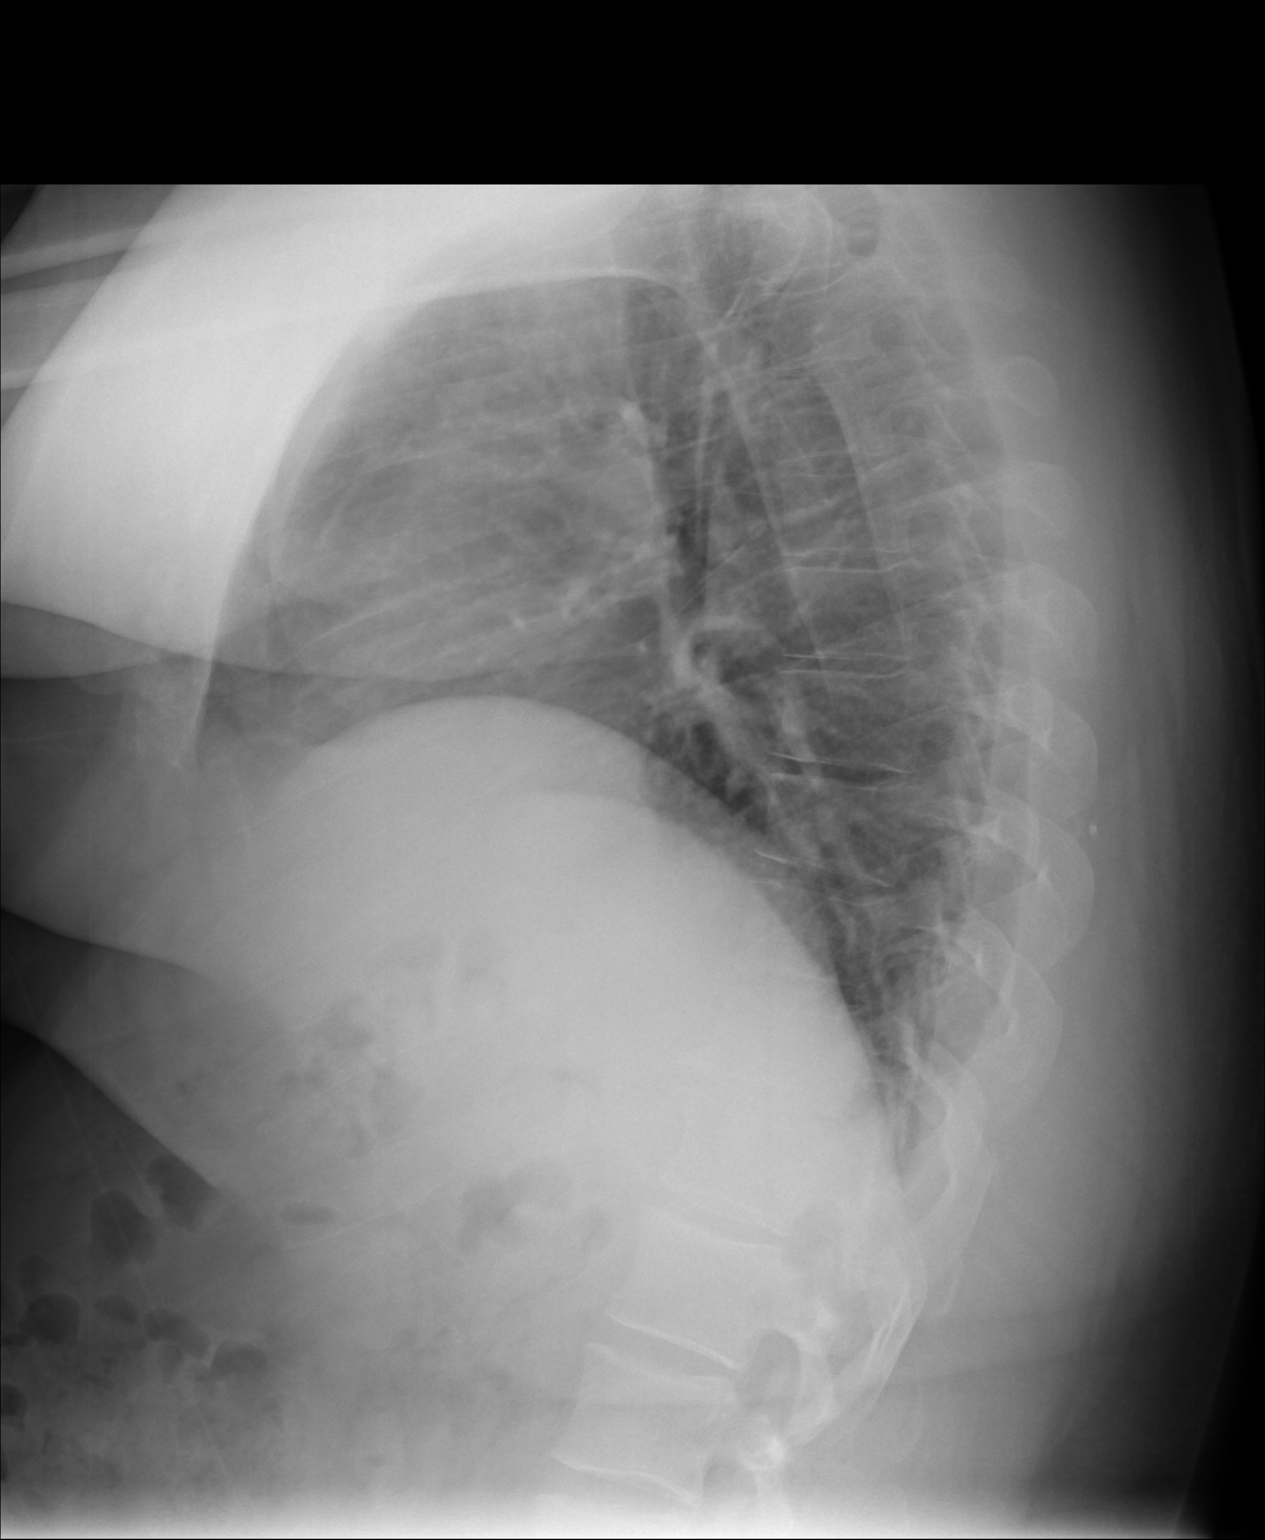

[2 of 2 positions shown; findings below may reference images not displayed]

PROCEDURE:     DXR - DXR CHEST PA (OR AP) AND LATERAL  - May 28, 2011  [DATE]

RESULT:     Comparison is made to the previous examination dated for
April 2011.

There shallow inspiratory effort. The lungs are clear. The heart and
pulmonary vessels are normal. The bony and mediastinal structures are
unremarkable. There is no effusion. There is no pneumothorax or evidence of
congestive failure.
IMPRESSION: No acute cardiopulmonary disease.

## 2014-02-10 DIAGNOSIS — Z87898 Personal history of other specified conditions: Secondary | ICD-10-CM | POA: Insufficient documentation

## 2014-02-10 DIAGNOSIS — Z8659 Personal history of other mental and behavioral disorders: Secondary | ICD-10-CM | POA: Insufficient documentation

## 2014-02-10 DIAGNOSIS — Z765 Malingerer [conscious simulation]: Secondary | ICD-10-CM | POA: Insufficient documentation

## 2014-09-04 NOTE — Discharge Summary (Signed)
PATIENT NAME:  Craig Rosales, Craig Rosales MR#:  [id number] DATE OF BIRTH:  [birth year]  DATE OF ADMISSION:  01/05/2012 DATE OF DISCHARGE:  01/07/2012  PRIMARY CARE PHYSICIAN: Non-local   CONSULTING PHYSICIAN:  Dr. Tedd SiasSolum   DISCHARGE DIAGNOSES:  1. Hyperglycemia.  2. Uncontrolled diabetes.  3. Acute renal failure.  4. Hypertension.   PROCEDURES:  None.  CONDITION: Stable.   MEDICATION RECONCILIATION:  For the patient'Rosales home medications on discharge, please refer to the [facility] physician'Rosales discharge instructions.   DIET: Low sodium, low fat, low cholesterol ADA diet.   ACTIVITY: As tolerated.   FOLLOWUP:  1. Follow up with primary care physician within 1 to 2 weeks. 2. Follow up with Dr. Tedd SiasSolum within one week.   REASON FOR ADMISSION: High blood sugars, nausea and vomiting.   HOSPITAL COURSE: The patient is a 34 year old obese African American male with a history of insulin-dependent diabetes on Humulin RU-500, hypertension, recent below-knee amputation  one month back, and insulin regimen changes who presented to the ED with high blood sugar, nausea, and vomiting. The patient was noted to have a high blood sugar at 850, difficult to control blood sugar as an outpatient in spite of being on Humulin RU-500.   For detailed history and physical examination, please refer to the admission note dictated by Dr. Elpidio AnisSudini. On the admission date, the patient'Rosales glucose was 826, BUN 10, creatinine 1.41, sodium 127, potassium 4.5, bicarbonate 20, anion gap 14. Urinalysis negative.   HOSPITAL COURSE: 1. The patient was admitted for hyperglycemia with uncontrolled diabetes. Since admission the patient was treated with Lantus 30 units b.i.d. subcutaneous with Humulin R 30 units 3 times daily before meals.  In addition, the patient has been treated with metformin 1000 mg p.o. b.i.d. with sliding scale. Dr. Tedd SiasSolum evaluated the patient. She recommended holding Byetta due to nausea and vomiting, and restart metformin  1000 mg p.o. b.i.d., continue Lantus 30 units subcutaneous b.i.d. with Actos. In addition, she increased regular insulin to 50 units t.i.d. before meals.   After the above-mentioned treatment, the patient'Rosales blood sugar decreased to about 200. Today'Rosales blood sugar before lunchtime was 221, and at about 4:00 p.m. it was 142. The patient'Rosales blood sugar has been improving.  2. Hyponatremia, possibly due to hyperglycemia, has improved.  3. Mild acute renal failure has improved after treatment with IV fluids with normal saline.   The patient is clinically stable and will be discharged to home today. I discussed the patient'Rosales discharge plan with the patient, nurse, and case manager.   TIME SPENT: About 36 minutes.    ____________________________ Craig PollackQing Karlton Maya, Craig Rosales qc:bjt D: 01/07/2012 17:02:39 ET T: 01/08/2012 10:28:22 ET JOB#: [id number]  cc: Craig PollackQing Kenyada Rosales, Craig Rosales, <Dictator> Craig Rosales ELECTRONICALLY SIGNED 01/12/2012 16:40

## 2014-09-04 NOTE — Consult Note (Signed)
Chief Complaint and History:   Referring Physician Dr. Elpidio AnisSudini    Chief Complaint uncontrolled diabetes   Allergies:  Ibuprofen: Rash  Latex: Unknown  Assessment/Plan:   Assessment/Plan 34 yo M recently diagnosed with diabetes who was admitted yesterday with severely uncontrolled diabetes and glc >800. His recent out-patient regimen has been U-500 Regular insulin at meals 20/17/15, metformin 1000 mg bid, Byetta 10 mcg bid, and Actos 15 mg daily. He reports good compliance. Sugar have been high for days. Two weeks ago he had been on Lantus 65 units bid, Byetta 5 mcg bid, REgular insulin at meals 20 units id, and metformin 1000 mg bid. He reports a 30 lb weight loss x6 weeks. He has daily nausea, poor appetite and occasional vomiting. He denies F/C or CP,SOB. He has mild dysuria, but had a neg UA. He was examined and chart was reviewed.  A/ 1. 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 Obesity  P/ 1. Check lipase in AM and hold Byetta given his N/V 2. Restart metformin 1000 mg bid 3. Continue current doses of Lantus + Actos 4. Inc Regular insulin to 50 units tid AC 5. Continue low carb diet  Full consult has been dictated.    Case Discussed With patient, family   Electronic Signatures: Raj JanusSolum, Anna M (MD)  (Signed 21-Aug-13 20:42)  Authored: Chief Complaint and History, ALLERGIES, Assessment/Plan   Last Updated: 21-Aug-13 20:42 by Raj JanusSolum, Anna M (MD)

## 2014-09-04 NOTE — Consult Note (Signed)
PATIENT NAME:  Craig Rosales, Craig Rosales MR#:  [id number] DATE OF BIRTH:  30-Jan-1981  DATE OF CONSULTATION:  01/06/2012  REFERRING PHYSICIAN:  Srikar R. Sudini, MD   CONSULTING PHYSICIAN:  A. Wendall Mola, MD  CHIEF COMPLAINT: Uncontrolled diabetes.   HISTORY OF PRESENT ILLNESS: The patient is a 34 year old male seen in consultation for diabetes. He was admitted yesterday after presenting with severe hyperglycemia. Admission labs included a glucose of 826, creatinine of 1.4, and a mildly low bicarbonate of 20 without ketosis. He was placed on subcutaneous insulin and had fairly rapid improvement in blood sugars. Over the last eight hours, sugars have been in the range of 243 to 329 on fingerstick testing. His current regimen is Lantus 30 units every 12 hours, Byetta 10 mcg b.i.d., Actos 15 mg daily, and Humulin R 30 units t.i.d. at meals.   The patient was diagnosed with diabetes just last month. He was admitted at [facility] for diabetic ketoacidosis and initially was prescribed Lantus 55 units b.i.d., Humulin R 20 units t.i.d., and Byetta 5 mcg b.i.d. He has established care with Norina Buzzard PA, of [facility]. He has had a recent adjustment in his regimen and prior to this hospitalization was prescribed U-500 Humulin R, 20 units at breakfast, 17 units at lunch, and 15 units at supper, in addition to metformin 1000 mg b.i.d., Byetta 10 mcg b.i.d., and Actos 15 mg daily. He reports good compliance with this regimen. He claims his blood sugars have been running high in the last few days, typically above 400. He also reports that he has had frequent nausea and vomiting and poor appetite. Over the last six weeks, he estimates he has lost about 30 pounds. He currently has no nausea and has been able to tolerate his diabetic diet. He denies fever. He has had mild dysuria without hematuria, although a urinalysis done earlier this morning did not show evidence of infection. His current hemoglobin A1c is 13.3%.  He believes an A1c done last month was 11.5%. Diabetes is chronically uncontrolled.   PAST MEDICAL HISTORY:  1. Diabetes.  2. Hypertension.  3. Vitamin D deficiency.  4. History of nephrolithiasis.  5. Asthma.  6. Gastroesophageal reflux disease.  7. Hyperlipidemia.  8. History of depression.  9. Sleep apnea.  10. History of conversion disorder, December 2012.   PAST SURGICAL HISTORY:  1. Spinal laminectomy.  2. Right knee surgery.   ALLERGIES: Ibuprofen.   FAMILY HISTORY: Positive for diabetes, hypertension, and hyperlipidemia.   SOCIAL HISTORY: The patient has four children. He denies use of alcohol or drugs. His family is all at the bedside.   CODE STATUS: The patient is FULL CODE.   CURRENT INPATIENT MEDICATIONS:  1. Zyrtec 10 mg daily.  2. Neurontin 300 mg t.i.d.  3. Ritalin 7.5 mg daily.  4. Pantoprazole 40 mg daily.  5. Actos 15 mg daily.  6. Potassium chloride 20 mEq once daily.  7. Pravachol 40 mg at bedtime.  8. Carafate 1 gram t.i.d. a.c.  9. Aspirin 81 mg daily.  10. Heparin 5000 units every 12 hours.  11. Exenatide 10 mcg b.i.d.  12. Lantus 30 units every 12 hours.  13. Regular insulin modified sliding scale.  14. Regular insulin 30 units t.i.d. a.c.   REVIEW OF SYSTEMS: HEENT: Denies blurred vision. Denies headache. NECK: Denies neck pain. Denies odynophagia. CARDIAC: Denies chest pain or palpitation. PULMONARY: Denies cough or shortness of breath. ABDOMEN: Denies abdominal pain. Appetite is fair. Reports nausea as  above. EXTREMITIES: Denies leg swelling. NEUROLOGIC: Reports occasional paresthesias and pain in the feet. Denies recent fall. SKIN: Denies rash, pruritus, or recent skin changes. HEMATOLOGIC: Denies easy bruisability or recent bleeding.   PHYSICAL EXAMINATION:  VITAL SIGNS: Temperature 98.5, pulse 88, respirations 18, blood pressure 125/85, height 70 inches, weight 267 pounds (121 kg), and BMI 38.4.   GENERAL: Obese African American male in no  distress.   HEENT: Extraocular movements are intact. Oropharynx is clear. Mucous membranes are moist.   NECK: Supple. No thyromegaly.   CARDIAC: Regular rate and rhythm without murmur.   PULMONARY: Clear bilaterally. Good inspiratory effort. No use of accessory muscles of respiration.   ABDOMEN: Diffusely soft, nontender, nondistended.   EXTREMITIES: No edema is present.   SKIN: No skin changes or dermatopathy.   PSYCHIATRIC: Alert and oriented x3, calm and cooperative.   LABORATORY, DIAGNOSTIC AND RADIOLOGICAL DATA: On 01/06/2012 at 3:00 a.m., glucose 355, BUN 7, creatinine 0.92, sodium 137, potassium 3.4, CO2 25, total protein 7, albumin 3.3, AST 46, ALT 43. Hematocrit 37, WBC 6.7, platelets 178. Urinalysis is notable for a glucosuria of greater than 500, 1+ blood, negative nitrates and leukocyte esterase.   ASSESSMENT: A 34 year old male with uncontrolled diabetes (A1c over 13%), possibly complicated by peripheral neuropathy, admitted yesterday with severe hyperglycemia without ketosis. Glycemic control has improved on resumption of his insulin and Actos, making me question his compliance with his prior regimen.   RECOMMENDATIONS:  1. Restart metformin 1000 mg b.i.d.  2. Given his nausea with emesis, I will hold Byetta and I plan to obtain a lipase level.  3. Continue Actos.  4. Continue Lantus at current dose.  5. Increase his regular insulin to 50 units t.i.d. a.c.  6. Modify his sliding scale regimen to 4 units per 50 of  blood sugar over a target of 150.  7. I discussed with the patient the possibility of an insulin pump as I feel perhaps he may benefit from a pump if he can exhibit good compliance as well as knowledge about carbohydrate counting. He plans to follow with Darlyn ChamberJill Largae, PA, at [facility] after discharge and plans to discuss it further at that time.   I will be unavailable to see the patient tomorrow, however, I will return on 01/08/2012.   ____________________________ A. Wendall MolaMelissa Tylynn Braniff, MD ams:cbb D: 01/06/2012 20:37:55 ET T: 01/07/2012 09:35:51 ET JOB#: [id number]  cc: A. Wendall MolaMelissa Katrenia Alkins, MD, <Dictator> [facility], Norina BuzzardJoe Largay, [place], at [facility] A. Wendall MolaMELISSA Hermes Wafer MD ELECTRONICALLY SIGNED 01/13/2012 16:47

## 2014-09-04 NOTE — H&P (Signed)
PATIENT NAME:  Craig Rosales, Craig Rosales MR#:  [id number] DATE OF BIRTH:  06/23/1980  DATE OF ADMISSION:  01/05/2012   PRIMARY CARE PHYSICIAN: Dr. Alwyn RenHopper   CHIEF COMPLAINT: High blood sugars, nausea, vomiting.   HISTORY OF PRESENTING ILLNESS: The patient is a 34 year old obese African American male patient with history of insulin dependent diabetes mellitus on Humulin R U-500, hypertension, recent BKA one month back, and insulin regimen change who presents to the Emergency Room complaining of high blood sugars, nausea and vomiting. The patient was noticed to have blood sugars of 850, difficult to control blood sugars as outpatient in spite of being on Humulin R U-500 and is being admitted to the hospitalist service. The patient does have low bicarb 20 with venous pH 7.26 and anion gap 14. The patient has received 1 liter of normal saline and no insulin in the Emergency Room. The patient mentions he has been compliant with medications and diet. He did have nausea and one episode of vomiting without any blood. Complains of some pleuritic midsternal chest pain, diffuse abdominal pain, and one episode of incontinence. He has had chronic weakness in his left lower extremity along with peripheral neuropathy which are unchanged. The patient [facility] and was switched from Lantus and Humulin R to Humulin R U-500 with 20 units with breakfast, 17 units with lunch, and 15 units with dinner and was taken off the long-acting insulin. The patient was continued on Actos, Byetta, and metformin.   The patient was last admitted in the hospital in January 2012 when he had complete muteness and was diagnosed with conversion disorder.   PAST MEDICAL HISTORY:  1. Conversion disorder. 2. Insulin dependent diabetes mellitus.  3. Essential hypertension.  4. Chronic backache.  5. Vitamin D deficiency.  6. Nephrolithiasis.   7. Asthma.  8. GERD.  9. Obesity.  10. Peripheral neuropathy.   11. Hyperlipidemia.  12. Depression.  13. Cervicalgia.  14. Sleep apnea.   PAST SURGICAL HISTORY:  1. Spinal laminectomy. 2. Right knee surgery.   ALLERGIES: Ibuprofen which causes swelling of his throat.   FAMILY HISTORY: Mother had diabetes. Father had hypertension and hyperlipidemia.   SOCIAL HISTORY: The patient lives in [place] with his brother. Does not smoke. No alcohol. No illicit drugs.   CODE STATUS: FULL CODE.   MEDICATIONS: 1. Acetaminophen 325 mg oral 3 times a day.  2. Actos 15 mg oral once a day.  3. Albuterol 2.5 mg/3 mL nebulizer every four hours as needed for shortness of breath.  4. Amitriptyline 50 mg 1 tablet at bedtime.  5. Aspirin 81 mg oral once a day.  6. Atorvastatin 40 mg oral once a day.  7. Byetta 10 mcg sub-Q b.i.d.  8. Carafate 1 mg oral 4 times a day.  9. Celexa 40 mg oral once a day.  10. Cetirizine 10 mg oral once a day.  11. Flexeril 10 mg orally every eight hours.  12. Flonase 50 mcg nasally once a day.  13. Gabapentin 300 mg oral 3 times a day.  14. Humulin R concentrated 500 20 units at breakfast, 17 units at lunch, and 15 units with dinner.  15. Lasix 20 mg oral once a day.  16. Metformin 1000 mg oral 2 times a day.  17. Protonix 40 mg oral once a day.  18. ProAir HFA 2 puffs inhaled every four hours as needed.  19. Promethazine 25 mg oral every six hours as needed for nausea and vomiting.  20. Qvar 80 mcg inhaled twice a day.  21. Ranitidine 150 mg oral 2 times a day.  22. Riboflavin 100 mg oral 2 times a day.  23. Ritalin 7.5 mg oral once a day.  24. Spironolactone 25 mg oral once a day.  25. Tramadol 50 mg oral 2 tablets every 6 to 8 hours as needed for pain.  26. Viagra 100 mg 0.5 to 1 tablet oral once a day as needed.  27. Vitamin B12 100 mcg oral once a day.  28. Vitamin C 500 mg oral once a day.  29. Vitamin D3 1000 international units oral once a day.   REVIEW OF SYSTEMS: CONSTITUTIONAL: Complains of fatigue. No  fever, weight loss or weight gain. EYES: No blurred vision, pain, or redness. ENT: Has no hearing loss, ear pain. Does complain of seasonal allergies. RESPIRATORY: No cough, wheeze, hemoptysis, dyspnea. CARDIOVASCULAR: Has pleuritic midsternal chest pain. No orthopnea, edema. GI: Has nausea, vomiting, abdominal pain. GENITOURINARY: Had one episode of incontinence prior to presenting to the hospital. No burning or hematuria. ENDOCRINE: Has polyuria, nocturia. No thyroid problems. HEMATOLOGIC/LYMPHATIC: No anemia, easy bruising or bleeding. SKIN: No rash, lesions. MUSCULOSKELETAL: Has chronic back pain and arthritis. NEUROLOGICAL: Peripheral neuropathy and chronic weakness in his left lower extremity. PSYCHIATRIC: Anxiety, depression.   PHYSICAL EXAMINATION:   VITAL SIGNS: Temperature 99, pulse 100, respirations 18, blood pressure 136/86, saturating 98% on room air.   GENERAL: Morbidly obese African American male patient lying in bed, comfortable, conversational, cooperative with exam.   PSYCHIATRIC: Alert and oriented x3. Mood and affect appropriate. Judgment intact.   HEENT: Atraumatic, normocephalic. Oral mucosa dry and pink. No oral ulcers or thrush. External ears and nose normal. No pallor. No icterus. Pupils bilaterally equal and reactive to light.   NECK: Supple. No thyromegaly. No palpable lymph nodes. Trachea midline. No carotid bruit or JVD.   CARDIOVASCULAR: S1, S2, regular rate and rhythm without any murmurs. Peripheral pulses 2+. No lower extremity edema.   RESPIRATORY: Normal work of breathing. Clear to auscultation on both sides. Has tenderness in the midsternal area on palpation. Normal work of breathing.   GI: Diffusely tender on deep palpation. No rigidity or guarding. Bowel sounds present. No hepatosplenomegaly palpable.   SKIN: Warm and dry. No petechiae, rash, ulcers.   GENITOURINARY: No redness, swelling, or warmth in the genital area. No hernias palpated.    MUSCULOSKELETAL: Has tenderness in the low back without any swelling or redness. No other joint swelling, redness, effusion. Normal muscle tone.   NEUROLOGICAL: Decreased motor strength in the left lower extremity, 5/5 in other extremities. Sensation is decreased in the lower extremities up to the knees.   LABORATORY, DIAGNOSTIC, AND RADIOLOGICAL DATA: Glucose 826, BUN 10, creatinine 1.41, sodium 127, potassium 4.5, chloride 93, bicarb 20 with anion gap 14. AST, ALT, alkaline phosphatase, bilirubin normal. CK 178. CK-MB 0.5. Troponin less than 0.02. WBC 7.9, hemoglobin 14.5, platelets 208. Urinalysis shows WBCs less than 1, no bacteria.  EKG shows normal sinus rhythm with no acute changes.   ASSESSMENT AND PLAN:  1. Hyperglycemia in a patient who is on U-500 concentrated Humulin R as outpatient recently changed but difficult to control as outpatient. The patient does seem to have some mild acidosis with pH of 7.29. Will admit the patient for further management. Concentrated form of Humulin R is not available in the hospital. Will put him on regular Humulin R, restart him on long-acting insulin of Lantus. Will consult Endocrinology for further input  with the case. The patient was recently taken off his long-acting Lantus. I believe he does need this in spite of being on the concentrated Humulin. Will put him on diabetic diet, high dose sliding scale insulin. Needs close monitoring.  2. Chest pain. This seems to be likely secondary from vomiting. EKG is unchanged. Cardiac enzymes are normal. Will check two more sets. He does not have any coronary artery disease history.  3. Acute renal failure secondary to dehydration from the hyperglycemia. Aggressive IV fluids.  4. Hyponatremia secondary to dehydration. We will continue the normal saline.  5. Hypertension, well controlled. Will have to hold his spironolactone, hydrochlorothiazide secondary to the acute renal failure. Will monitor IV medications as  needed, can be started once his kidney function is better.  6. Chronic pain syndrome. Continue home medications. 7. DVT prophylaxis with heparin.   CODE STATUS: FULL CODE.   TIME SPENT: Time spent today on this case was 60 minutes with more than 50% time spent in coordination of care.   ____________________________ Molinda Bailiff. Muadh Creasy, MD srs:drc D: 01/05/2012 21:09:00 ET T: 01/06/2012 05:57:43 ET JOB#: [id number]  cc: Wardell Heath R. Juliocesar Blasius, MD, <Dictator> [facility] Endocrinology, Dr. Marca Ancona  Dr. Dorann Lodge MD ELECTRONICALLY SIGNED 01/07/2012 14:22

## 2014-09-07 NOTE — H&P (Signed)
PATIENT NAME:  Craig Rosales, Craig Rosales MR#:  [id number] DATE OF BIRTH:  1980/06/17  DATE OF ADMISSION:  02/23/2013  REFERRING PHYSICIAN: Emergency Room MD   ATTENDING PHYSICIAN: Akima Slaugh B. Jennet Maduro, MD   IDENTIFYING DATA: Craig Rosales is a 34 year old male with new-onset depression.   HISTORY OF PRESENT ILLNESS: Craig Rosales has been moderately depressed since his back surgery 7 years ago, after which he became disabled. He does not receive disability but has not been able to work anymore. Even doing everyday activities or helping around the house is  out of the question. He does not cook, clean or take care of the kids. He completely depends on his wife, who has been working several jobs to support the patient and their 4 children. The patient became increasingly depressed and in the past several weeks started obsessing about the possibility of "checking himself out." He feels very guilty and feels that his wife deserves to have a partner who would support her and help her. He appreciates her efforts and feels bad about being "useless," but his physical disability absolutely prevents him from any activity. However, his planned suicide was to drive a car off the road. Apparently, driving a car is not such a trouble. The patient denies ever being suicidal or attempting suicide. He denies psychotic symptoms, denies symptoms suggestive of bipolar mania. There is no alcohol, illicit drugs or prescription pill abuse. He endorses poor sleep, decreased appetite, anhedonia, feeling of guilt, hopelessness, worthlessness, poor energy, poor memory and concentration, crying spells, but also heightened anxiety, irritability, poor anger control that culminated in almost a suicide attempt while driving. He brought himself to the hospital instead.   PAST PSYCHIATRIC HISTORY: No treatments. No suicide attempts. No hospitalizations. No substance abuse problems.   FAMILY PSYCHIATRIC HISTORY: None reported.   PAST MEDICAL  HISTORY: Type 2 diabetes, diabetic neuropathy, hypertension, vitamin D deficiency, asthma, nephrolithiasis, GERD, dyslipidemia, COPD, morbid obesity, status post spinal laminectomy, chronic pain.  MEDICATIONS ON ADMISSION: Lisinopril 10 mg daily, vitamin C daily, vitamin B12 at 1000 mcg daily, Protonix 40 mg daily, vitamin D 3000 mg daily, Neurontin 1200 mg 3 times daily, Actos 15 mg daily, amitriptyline 100 mg daily, aspirin 81 mg daily, atorvastatin 80 mg daily, colchicine 0.6 mg as needed, fluticasone 2 sprays daily, Humulin R sliding scale, QVAR 80 mcg 2 puffs twice daily, vitamin B 200 mg daily, tramadol 100 mg every 4 hours as needed for back pain, Ambien 10 mg as needed, Effexor-XR 75 mg, promethazine 25 mg q.6 hours as needed, Abilify 2 mg daily, fentanyl patch 25 mcg every 3 days, methylphenidate 20 mg daily, Lantus 100 units twice daily, Coreg 3.125 mg twice daily, allopurinol 100 mg daily.   ALLERGIES: IBUPROFEN AND LATEX.   SOCIAL HISTORY: He is disabled following back surgery. He used to work as a Lawyer. Believes that heavy lifting contributed to his back problems. He lives with his wife and 4 children. The wife is a Lawyer.   REVIEW OF SYSTEMS: CONSTITUTIONAL: No fevers or chills. Positive for fatigue and chronic pain. Positive for gradual weight gain.  EYES: No double or blurred vision.  ENT: No hearing loss.  RESPIRATORY: No shortness of breath or cough.  CARDIOVASCULAR: No chest pain or orthopnea.  GASTROINTESTINAL: No abdominal pain, nausea, vomiting or diarrhea.  GENITOURINARY: No incontinence or frequency.  ENDOCRINE: No heat or cold intolerance.  LYMPHATIC: No anemia or easy bruising.  INTEGUMENTARY: No acne or rash.  MUSCULOSKELETAL: Positive for pain, especially in the  back.  NEUROLOGIC: No tingling or weakness.  PSYCHIATRIC: See history of present illness for details.   PHYSICAL EXAMINATION: VITAL SIGNS: Blood pressure 131/86, pulse 90, respirations 18, temperature 98.2.   GENERAL: This is an obese male in no acute distress.  HEENT: The pupils are equal, round and reactive to light. Sclerae are anicteric.  NECK: Supple. No thyromegaly.  LUNGS: Clear to auscultation. No dullness to percussion.  HEART: Regular rhythm and rate. No murmurs, rubs or gallops.  ABDOMEN: Soft, nontender, nondistended. Positive bowel sounds.  MUSCULOSKELETAL: Normal muscle strength in all extremities.  SKIN: No rashes or bruises.  LYMPHATIC: No cervical adenopathy.  NEUROLOGIC: Cranial nerves II through XII are intact.   LABORATORY DATA: Chemistries are within normal limits except for blood sugar of 426, sodium 128. Blood alcohol level zero. Hemoglobin A1c 14.4. LFTs within normal limits with bilirubin 1.1. TSH 1.61. Urine tox screen positive for tricyclic antidepressants. CBC within normal limits. Urinalysis is suggestive of urinary tract infection with trace of leukocyte esterase and 21 cells per field.   MENTAL STATUS EXAMINATION: The patient is alert and oriented to person, place, time and situation. He is pleasant, polite and cooperative. He is well groomed and casually dressed. There is severe psychomotor retardation. He maintains limited eye contact. His speech is slow and soft. Mood depressed with flat affect. Thought process logical and goal oriented. Thought content: He denies suicidal or homicidal ideation at present, but reports intrusive thoughts of suicide with a plan to drive his car off the road. There are no delusions or paranoia. There are no auditory or visual hallucinations. His cognition is grossly intact. He registers 3 out of 3 and recalls 3 out of 3 objects after 5 minutes. He can spell "world" forwards and backwards. He knows the current president. His insight and judgment are fair.   SUICIDE RISK ASSESSMENT ON ADMISSION: This is a patient with depression, chronic pain, multiple social stressors, who is ruminating on thoughts of suicide. He is at increased risk of  suicide.   DIAGNOSES: AXIS I: Major depressive disorder, recurrent, severe, without psychotic features.  AXIS II: Deferred.  AXIS III: Diabetes, hypertension, dyslipidemia, obesity, hypothyroidism, chronic pain.  AXIS IV: Mental and physical illness, financial, primary support, access to care.  AXIS V: Global assessment of functioning on admission 25.   PLAN: The patient was admitted to [facility] for safety, stabilization and medication management. He was initially placed on suicide precautions and was closely monitored for any unsafe behaviors. He underwent full psychiatric and risk assessment. He received pharmacotherapy, individual and group psychotherapy, substance abuse counseling, and support from therapeutic milieu.  1.  Suicidal ideation: The patient is able to contract for safety. 2.  Mood: We will initially continue his medications as in the community with Cymbalta and Abilify.  3.  We will continue all medications to address his multiple medical problems.  4.  Diabetes: Seems poorly controlled. Will ask Dr. Tedd SiasSolum for help.  5.  Social: Family meeting would be very useful.  6.  Disposition: He will be discharged to his family.    ____________________________ Ellin GoodieJolanta B. Jennet MaduroPucilowska, MD jbp:jm D: 02/24/2013 21:39:04 ET T: 02/24/2013 22:29:16 ET JOB#: [id number]  cc: Ziva Nunziata B. Jennet MaduroPucilowska, MD, <Dictator> Shari ProwsJOLANTA B Saron Vanorman MD ELECTRONICALLY SIGNED 02/28/2013 21:00
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
## 2014-09-07 NOTE — H&P (Signed)
PATIENT NAME:  Craig Rosales, Craig Rosales MR#:  [id number] DATE OF BIRTH:  1980-09-28  DATE OF ADMISSION:  09/22/2012  PRIMARY CARE PHYSICIAN: [facility]  ER REFERRING PHYSICIAN: Malachy Moanevainder Goli, MD  ADMITTING PHYSICIAN: Katha HammingSnehalatha Amrit Cress, MD   HISTORY OF PRESENT ILLNESS: The patient is a very young 34 year old male with multiple medical problems of hypertension, diabetes, depression, gout, chronic back pain and sleep apnea, brought in by EMS. The patient was walking through the Emergency Room and then on the way he fell down. The patient was brought in by EMS.  When EMS arrived and the patient was brought in here, the patient'Rosales blood work showed hyperglycemia with blood sugar of 531 and also had hyponatremia with sodium 127. The patient was seen at bedside, says that he is not feeling well for the past 2 two weeks with epigastric pain and nausea and vomiting. The patient also says that he has poor appetite. He denies any diarrhea, and 3 weeks ago he said he was diagnosed with gout.  Since then he says all his problems started.  He has epigastric pain mainly all the time around 7 to 8 out of 10 in severity, not radiating into the back, no aggravating or relieving factors. The patient has no diarrhea. The patient also says that he is having a lot of stress and now (having bervous breakdowns  recently. He asked his wife to get him to the Emergency Room, but she did not think he needed to come here, so he started to walk by himself.  On the way here, he was found on the street so EMS got him here.  The patient also says that he is very stressed about the whole thing and the whole family situation and wants to talk to a psychiatrist.  In the ER, the patient received fluids and Dr. Enedina FinnerGoli asked him to see if he wants to go home, but he said he would rather stay overnight and be monitored. The patient denies any other complaints. No fever. No cough. No trouble breathing.   PAST MEDICAL HISTORY: Significant  for hypertension, diabetes, gout, history of sleep apnea, chronic back pain and hyperlipidemia,   MEDICATIONS: The patient is on multiple medications:  1.  Abilify 2 mg p.o. daily.  2.  Acetaminophen 325 mg 3 tablets p.o. t.i.d.  3.  Actos 15 mg p.o. daily. 4.  Amitriptyline  100 mg p.o. daily. 5.  Aspirin 81 mg daily. 6.  Atorvastatin 80 mg in the evening.  7.  Cetirizine 10 mg p.o. daily.  8.  Cinnamon 500 mg 2 capsules once a day.  9.  Colcrys 0.6 mg p.o. as needed.  10.  Fentanyl patch 25 mcg every 72 hours.  11.  Fluticasone nasal spray 2 sprays once a day. 12.  Neurontin 300 mg 3 capsules in the morning, and 3 capsules at noon, 4 capsules at bedtime.  13.  Humulin R 500 units/mL, takes 20 units in the morning, 19 units at lunch, 15 units at night. 14.  Lisinopril 10 mg p.o. daily.   16.  Pantoprazole 40 mg p.o. daily.  17.  Phenergan 25 mg every 6 hours p.r.n. for nausea.  18.  QVAR 80 mcg 2 puffs b.i.d.  19.  Ranitidine 150 mg p.o. b.i.d.  20.  Aldactone 50 mg p.o. daily.  21.  Tramadol 50 mg 2 tablets 4 times daily.  22.  Venlafaxine 75 mg p.o. daily. 23.  Vitamin D 200 mg p.o. daily.  24.  Vitamin B12 1000 mcg p.o. daily. 25.  Vitamin C 500 mg p.o. daily. 26.   Ambien 5 mg p.o. daily as needed for sleep.   SOCIAL HISTORY: Smokes 1-1/2 packs per day.  No  alcohol.  The patient worked as a Lawyer at [employer].   PAST SURGICAL HISTORY: Significant for L4 to L5 repair at [facility], and also history of arthroscopy to the knee and also had a history of kidney stones  remove.    FAMILY HISTORY: Mother had diabetes. Father also had diabetes but no issues other than that.   REVIEW OF SYSTEMS:   CONSTITUTIONAL: Feels fatigue.  EYES: No blurred vision.  ENT: No tinnitus. No epistaxis. No difficulty swallowing.  RESPIRATIONS: The patient denies any shortness of breath or cough.  CARDIOVASCULAR: The patient complains of multiple pain-related complaints, chest pain, abdominal pain.   GASTROINTESTINAL The patient also complains of epigastric pain and nausea and had 1 vomiting today, also has a history of GERD.   GENITOURINARY: No dysuria.  ENDOCRINE: Has diabetes.  HEMATOLOGIC: No anemia.  INTEGUMENTARY: No skin rashes.  MUSCULOSKELETAL: Complains of back pain, joint pains, gout.  NEUROLOGIC: No numbness.  PSYCHIATRIC: The patient does have anxiety and depression.   PHYSICAL EXAMINATION: VITAL SIGNS: Temperature is 98.5, heart rate 96, respirations 18, blood pressure 109/79. Sats 96% on room air.  GENERAL: Alert, awake, oriented, a very anxious male not in distress, answering questions appropriately.  HEENT: Head atraumatic, normocephalic. Pupils are equally reacting to light. Extraocular movements are intact. ENT: No tympanic membrane congestion. No turbinate hypertrophy. No oropharyngeal erythema.  NECK: Normal range of motion. No JVD. No carotid bruit.  CARDIOVASCULAR: S1, S2 regular. No murmurs.  LUNGS: Clear to auscultation. No wheeze. No rales.  ABDOMEN: Soft, nontender, nondistended, bowel sounds present.  EXTREMITIES: No extremity edema, no cyanosis, no clubbing.  MUSCULOSKELETAL:  Tenderness in the lower back but no joint deformity.  NEUROLOGICAL: Awake, alert and oriented. Cranial nerves II through XII are intact. Power 5 out of 5 in upper and lower extremities. No focal neurological deficit.   LABORATORY AND RADIOLOGICAL DATA:  The patient'Rosales WBC is 6.4, hemoglobin 15.1, hematocrit 44.5, platelets 162. Electrolytes:  Sodium 127, potassium 3.8, chloride 94, bicarbonate 21, BUN 12, creatinine 1.17, glucose 508. The patient'Rosales magnesium is 1.8. Troponin less than 0.02. Urine is clear.  Repeat blood glucose 306.   The patient'Rosales EKG shows normal sinus 99 beats per minute, no ST-T changes.   ASSESSMENT AND PLAN:  The patient is a 34 year old male with: 1.  Insulin-dependent diabetes mellitus, has hyperglycemia. The patient has been having a lot of stress.  I  really do not see any focus of infection.  Continue the patient on his home-dose Lantus along with U-500 concentrated Humulin R.  The patient has just hyperglycemia and some hyponatremia. Other than that, he has no renal failure, no acidosis.   2.  Hyponatremia: Continue normal saline at 150/h. Continue diabetic diet.  3.  The patient has multiple complaints of abdominal pain, chest pain without evidence of any abnormality in the LFTs or troponins, and EKG also is normal. The patient has conversion disorder.  We will get the psychiatry on call for consult because the patient also has some anxiety and depression.  4.  Hypertension:  Controlled, continue home medications.  5.  Chronic back pain:  The patient is on a fentanyl patch.  I am not sure who ordered that, but he is on it, along with  Neurontin, continue that.  6.  Depression:  He is already and venlafaxine, continue that. We will get the psychiatry on call to discuss about his anxiety issues.  7.  History of gout: On Colcrys as needed. It can be continued.   TIME SPENT ON HISTORY AND PHYSICAL: About 55 minutes.    ____________________________ Katha Hamming, MD sk:cb D: 09/22/2012 19:41:59 ET T: 09/22/2012 20:06:27 ET JOB#: [id number]  cc: Katha Hamming, MD, <Dictator> Katha Hamming MD ELECTRONICALLY SIGNED 09/23/2012 17:33

## 2014-09-07 NOTE — Consult Note (Signed)
PATIENT NAME:  Craig Rosales, Craig Rosales MR#:  [id number] DATE OF BIRTH:  January 10, 1981  DATE OF CONSULTATION:  02/24/2013  REFERRING PHYSICIAN:  Kristine Linea, MD  CONSULTING PHYSICIAN:  A. Wendall Mola, MD  CHIEF COMPLAINT: Uncontrolled diabetes.   HISTORY OF PRESENT ILLNESS: This is a 34 year old male who was admitted yesterday to behavioral health unit for depression with suicidality. He has long-standing diabetes. The patient is well known to me and follows with me regularly in the endocrine clinic. Last office visit was in August. His outpatient regimen should be Lantus 100 units b.i.d., pioglitazone 15 mg daily, metformin 2000 mg daily and Humulin R U-500 insulin 30 units t.i.d. before meals (this dose equals 150 units of regular insulin t.i.d. before meals). Diabetes is chronically uncontrolled and last hemoglobin A1c in August was 13.4%. Diabetes is complicated by peripheral neuropathy. On admission, his blood sugar yesterday was 426. Blood sugar last evening was 555, 6:00 a.m. blood sugar was 325, and before lunch today blood sugar was 424. Since his admission, he has been receiving Levemir 100 units b.i.d. and NovoLog insulin 15 units t.i.d. before meals and a NovoLog insulin sliding scale of 4 units if sugar 201 to 300, an additional 2 units per 100 over a target of 300.   PAST MEDICAL HISTORY: 1.  Type 2 diabetes.  2.  Diabetic peripheral neuropathy.  3.  Hypertension.  4.  Vitamin D deficiency.  5.  Depression.  6.  Asthma.  7.  Nephrolithiasis.  8.  GERD. 9.  Hyperlipidemia.  10.  Obstructive sleep apnea.  11.  Morbid obesity.   PAST SURGICAL HISTORY: 1.  Spinal laminectomy.  2.  Right knee surgery.   OUTPATIENT MEDICATIONS: Should include:  1.  Lisinopril 10 mg daily.  2.  Vitamin C 500 mg daily.  3.  Vitamin B12 1000 mcg daily.  4.  Protonix 40 mg daily.  5.  Vitamin D3 1000 units daily.  6.  Gabapentin 300 mg 3 capsules t.i.d.  7.  Actos 15 mg daily. 8.   Amitriptyline 100 mg daily.  9.  Aspirin 81 mg daily.  10.  Atorvastatin 80 mg daily.  11.  Colcrys 0.6 mg as needed.  12.  Fluticasone 2 sprays once daily.  13.  Humulin R u-500 insulin 30 units t.i.d. before meals plus sliding scale 1 unit per 50 over a target of 150.  14.  Q-Var 80 mcg 2 puffs b.i.d.  15.  Vitamin B2 100 mg daily.  16.  Tramadol 50 mg 2 tabs q. 4 hours as needed for back pain.  17.  Zolpidem 10 mg at bedtime as needed.  18.  Effexor-XR 75 mg daily.  19.  Promethazine 25 mg q. 6 p.r.n.  20.  Abilify 2 mg daily.  21.  Fentanyl patch 25 mcg q. 72 hours.  22.  Methylphenidate 20 mg once daily.  23.  Lantus 100 units b.i.d.  24.  Coreg 3.125 mg b.i.d.  25.  Allopurinol 100 mg daily.   ALLERGIES: IBUPROFEN AND LATEX.  FAMILY HISTORY: Positive for diabetes, hypertension and hyperlipidemia.   SOCIAL HISTORY: The patient is married. He has 4 children. He is not employed. He denies use of tobacco or alcohol.   REVIEW OF SYSTEMS:  GENERAL: Reports a 10 pound weight loss. Denies fevers.  HEENT: He reports blurred vision at times. Denies sore throat.  NECK: Denies neck pain. Denies dysphasia.  CARDIAC: Denies chest pain. Denies palpitations.  PULMONARY: Denies shortness of breath. Denies cough.  ABDOMEN: Denies abdominal pain. Appetite is good.  EXTREMITIES: Denies leg swelling.  SKIN: Denies recent skin changes or rash.  HEME: Denies easy bruisability or recent bleeding.  ENDOCRINE: Denies heat or cold intolerance.   PHYSICAL EXAMINATION: VITAL SIGNS: Height 70 inches, weight 252 pounds, BMI 36.2.  GENERAL: Overweight African American male in no acute distress.  HEENT: EOMI. Oropharynx is clear.  NECK: Supple. No thyromegaly is present.  CARDIAC: Regular rate and rhythm. No carotid bruit.  LUNGS: Clear to auscultation bilaterally. No wheeze or rhonchi. Good inspiratory effort.  ABDOMEN: Diffusely soft, nontender, positive bowel sounds.  EXTREMITIES: No edema is  present.  NEUROLOGIC: He has decreased sensation to light touch on the feet. No gross deficits. No tremor is present.  MUSCULOSKELETAL: Normal gait and station.  SKIN: No rash or dermatopathy is present.  PSYCHIATRIC: Alert and oriented, calm and cooperative.   LABORATORY DATA: Glucose 426, BUN 7, creatinine 1.04, sodium 128, potassium 4.5, chloride 97, anion gap 6, calcium 9.4. Hemoglobin A1c 14.4%. Total protein 8.1, AST 21, ALT 33. TSH 1.61. WBC 5.1, hematocrit 44.5.   ASSESSMENT: A 34 year old male with chronically uncontrolled diabetes and neurologic complications to include diabetic peripheral neuropathy, currently with ongoing poor glycemic control, admitted now to the behavioral health unit for depression and suicidality.  RECOMMENDATIONS: 1.  Continue Levemir 100 units.  2.  Increase NovoLog to 50 units t.i.d. before meals.  3.  Change NovoLog sliding scale to 5 units per every 50 that blood sugar is over a target of 150.  4.  Check blood sugar before meals and at bedtime.  5.  Low carb diet. No concentrated sweets.   Thank you for the kind request for consultation. I will be unavailable to see the patient over this upcoming weekend; however, I will return to see him on Monday, October 13. ____________________________ A. Wendall MolaMelissa Solum, MD ams:sb D: 02/24/2013 13:31:22 ET T: 02/24/2013 13:59:33 ET JOB#: [id number]  cc: A. Wendall MolaMelissa Solum, MD, <Dictator> Macy MisA. MELISSA SOLUM MD ELECTRONICALLY SIGNED 02/28/2013 12:55

## 2014-09-07 NOTE — Consult Note (Signed)
PATIENT NAME:  Craig Rosales, Craig Rosales MR#:  [id number] DATE OF BIRTH:  1980/08/27  DATE OF CONSULTATION:  09/25/2012  REFERRING PHYSICIAN:   CONSULTING PHYSICIAN:  Assyria Morreale K. Juanmiguel Defelice, MD  IDENTIFYING INFORMATION:  The patient was seen in room 107.  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 The patient had to undergo back surgery and had complications because of diabetes mellitus.  He was put out of work and currently he is on disability.  The patient is married for 2 years and lives with his wife, who works as a Lawyer along, with their 4 children that are 8, 65, 68 and 42 years old.  The patient is being followed by psychiatrist for his depression and reports stress because wife is the only person that earns money  and he tries to help as much as he can, but sometimes he suffers from complications of diabetes and is not able to help her as much as he wants to.    CHIEF COMPLAINT: "I have high blood pressure and my blood sugars are very labile and not accurate and I needed help.  I feel depressed and I feel I am ready to have a nervous breakdown."   HISTORY OF PRESENT ILLNESS:  The patient reports that he was doing well when he was working as a Lawyer but currently is on disability and has to help his wife who is single earner in the family and has to take care of both kids by herself.  The patient reports feeling depressed and feeling stressed out and overwhelmed as he is not able to help her as much as he wants because of his medical problems.    PAST PSYCHIATRIC HISTORY:  No previous history of an inpatient on psychiatry.  No history of suicide attempt.  Being followed by Dr. Omelia Blackwater at [facility] in [place], [place].  Last appointment was in April 2014.  Next appointment is on 10/04/2012.  The patient is on the following medications:  Abilify, Effexor and Ritalin. Recently, when the patient went to his April  appointment, Dr. Omelia Blackwater increased his Ritalin.    ALCOHOL AND DRUGS:  Admits that has an occasional drink of alcohol.  Denies any street or prescription drug abuse.  Does admit smoking nicotine cigarettes at the rate of a pack a day for many years.   MENTAL STATUS:  The patient is dressed in hospital clothes, alert and oriented, calm, pleasant and cooperative.  Admits affect is appropriate with his mood which is low and down because of stresses of life as stated above wanting to help wife and not able to do so and he lives with this stress all the time. Admits feeling hopeless and helpless at times. Denies feeling worthless or useless.  He did have thoughts and wishes that if he ended it it would be good but he realizes that he has to live for his kids and denies any suicidal or homicidal  ideas or plans.  No psychosis.  Cognition intake. General knowledge and information is fair.  Insight and judgment fair.    IMPRESSION:  Major depressive disorder, recurrent; nicotine dependence.  RECOMMENDATIONS:  The patient has enough supply of Abilify and Effexor and Ritalin at home which was prescribed by Dr. Omelia Blackwater and is recommended to continue to take the same.  The patient is to keep his followup appointment on 10/04/2012 with Dr. Omelia Blackwater at [facility].  ____________________________ Jannet MantisSurya K. Guss Bundehalla, MD skc:sb D: 09/25/2012 19:36:00 ET T: 09/26/2012 09:37:34 ET JOB#: [id number]  cc: Monika SalkSurya K. Guss Bundehalla, MD, <Dictator> Beau FannySURYA K Sadako Cegielski MD ELECTRONICALLY SIGNED 09/26/2012 14:56

## 2014-09-07 NOTE — Discharge Summary (Signed)
PATIENT NAME:  Craig Rosales, Craig Rosales MR#:  [id number] DATE OF BIRTH:  Apr 24, 1981  DATE OF ADMISSION:  09/22/2012 DATE OF DISCHARGE:  09/26/2012  DISCHARGE DIAGNOSES:  1.  Diabetes ketoacidosis.  2.  Dehydration.  3.  Hyponatremia.  4.  Chronic back pain.  5.  Hypertension.  6.  Major depressive disorder.  7.  Tobacco abuse.   CONSULTS: Dr. Guss Bundehalla of psychiatry.   ADMITTING HISTORY AND PHYSICAL: Please see detailed H and P dictated on 09/22/2012. In brief, a 34 year old male patient with history of insulin-dependent diabetes mellitus presented to the hospital with not feeling well, nausea and vomiting and was found to have elevated blood sugars, positive ketones and admitted to the hospitalist service for treatment of DKA on insulin drip.   HOSPITAL COURSE:  1.  DKA. The patient had noncompliance with his insulin and also a HbA1c was 13.9. He was initially on insulin drip, later transitioned on 2 long-acting insulin along with sliding scale with which his blood sugars are much improved and on the day of discharge, the patient'Rosales blood sugars are 179 and 119.  2.  Major depressive disorder. The patient was seen by Dr. Guss Bundehalla after he was thought to have some suicidal ideation with the floor staff, but on the day of discharge, the patient does not have any suicidal ideation. No plan. He seems to have good social support. He was seen by Dr. Guss Bundehalla of psychiatry, who suggested the patient can be discharged home on his previous depression medications.   On the day of discharge, the patient has no concerns and is doing well. Psychiatric exam shows normal mood and affect along with no suicidal ideation. Cardiac examination shows S1 and S2. No tachycardia. Blood pressure 111/74. The patient is being discharged home in fair condition.   DISCHARGE MEDICATIONS: Include: 1.  Lisinopril 10 mg oral once a day.  2.  Vitamin C 500 mg oral once a day.  3.  Vitamin B12, 1000 mcg oral once a day. 4.  Protonix 40  mg oral once a day.  5.  Vitamin D3 1000 international units oral once a day. 6.  Gabapentin 300 mg 3 capsules oral once a day in the morning.  7.  Actos 15 mg oral once a day. 8.  Amitriptyline 100 mg oral once a day.  9.  Aspirin 81 mg oral once a day.  10. Atorvastatin 80 mg oral once a day.  11. Colcrys 0.6 mg oral once a day as needed for pain.  12. Fluticasone 2 sprays nasally once a day.  13. Humulin R recombinant 20 units subcutaneous once a day in the morning, 19 units at lunch and 15 units at night.  14. Qvar 80 mcg 2 puffs inhaled 2 times a day.  16. Vitamin B2 100 mg oral once a day.  17. Tramadol 50 mg 2 tablets oral 4 times a day.  18. Zolpidem 5 mg oral once a day at bedtime as needed.  19. Effexor 75 mg oral once a day.  20. Promethazine 25 mg oral every 6 hours as needed for nausea and vomiting. 21. Cinnamon 500 mg 2 capsules oral once a day.  22. Abilify 2 mg oral once a day.  23. Fentanyl 25 mcg patch every 72 hours.  24. Lisinopril 10 mg oral once a day. 25. Methylphenidate 10 mg oral once a day.  26. Insulin glargine 100 units subcutaneous 2 times a day.  27. Coreg 3.125 mg oral 2 times a day.  DISCHARGE INSTRUCTIONS: The patient was instructed to be compliant with his medications. Will be on ADA, carbohydrate-controlled diet. Activity as tolerated. Follow up with his primary care physician and psychiatrist in 1 week.   Time spent on day of discharge in discharge activity was 38 minutes.   ____________________________ Molinda Bailiff Sherrey North, MD srs:aw D: 09/26/2012 15:03:49 ET T: 09/27/2012 07:15:56 ET JOB#: [id number]  cc: Wardell Heath R. Corneilus Heggie, MD, <Dictator> Orie Fisherman MD ELECTRONICALLY SIGNED 09/28/2012 13:59
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
## 2014-09-08 NOTE — Discharge Summary (Signed)
PATIENT NAME:  Craig Rosales, Craig Rosales MR#:  [id number] DATE OF BIRTH:  21-Feb-1981  DATE OF ADMISSION:  06/07/2013 DATE OF DISCHARGE:  06/08/2013  For a detailed note, please check the history and physical done at admission by Dr. Juliene PinaMody.   DIAGNOSES AT DISCHARGE:  1.  Altered mental status/encephalopathy, likely nonspecific in nature, most likely related to underlying depression.  2.  History of depression, currently nonsuicidal.  3.  Nausea/vomiting, likely viral in nature, now resolved.  4.  Diabetes.  5.  History of gout.   DISCHARGE INSTRUCTIONS: The patient is being discharged on American Diabetic Association low-sodium diet. Activity is as tolerated. Follow up with his psychiatrist as an outpatient.   DISCHARGE MEDICATIONS:  Hydroxyzine 50 mg q.4 hours as needed, Synthroid 25 mcg daily, colchicine 0.6 mg b.i.d., methylphenidate 20 mg b.i.d., Proventil inhaler 2 puffs q.i.d. as needed, Klonopin 0.5 mg t.i.d., Abilify 30 mg daily, Levemir 70 units b.i.d., and NovoLog 25 units in the morning, 40 units at lunch, and 40 units at dinner.   CONSULTANTS DURING THE HOSPITAL COURSE:  Dr. Toni Amendlapacs from psychiatry.   PERTINENT STUDIES DONE DURING THE HOSPITAL COURSE:  A CT scan of the head done on admission showing no acute intracranial process.   HOSPITAL COURSE:  This is a 34 year old male with medical problems as mentioned above presented to the hospital due to altered mental status and also having nausea and vomiting.   PROBLEM LIST: 1.  Altered mental status. The exact etiology of this is unclear, but suspected to be related to underlying encephalopathy/depression. The patient'Rosales encephalopathy is nonspecific and possibly could be related to underlying hyperglycemia, which has now resolved. The patient'Rosales mental status is much improved and is at baseline. A psychiatric consult was obtained. The patient was seen by Dr. Toni Amendlapacs, who does not think that the patient needs inpatient psychiatric services.  He will continue his oral medications and follow up with his psychiatrist as an outpatient. The patient had no evidence of any acute infectious etiology or neurologic etiology to explain his altered mental status, and his mental status is now back down to baseline.  2.  Nausea and vomiting. The exact etiology of this is also unclear, but probably suspected to be viral in nature. It has since then improved and resolved. The patient has been now tolerating a soft diet well without any further nausea or vomiting. His LFTs are stable. This could be further followed up by his primary care physician.  3.  Diabetes. The patient'Rosales blood sugars were somewhat labile, uncontrolled. When he was admitted, his sugars were 500. He had received some IV fluids. He was started on some sliding scale insulin, and his sugars have improved. He likely is noncompliant with his diabetic meds. At this point, he will continue his Levemir and his NovoLog with meals as stated.  4.  Gout. He had no acute gout attack. He will continue his colchicine.  5.  Anxiety/depression. The patient was maintained on his Abilify, Klonopin, and methylphenidate, and he will resume that upon discharge and continue followup with his psychiatrist.   The patient is a FULL CODE.   TIME SPENT:  40 minutes.    ____________________________ Rolly PancakeVivek J. Cherlynn KaiserSainani, MD vjs:ms D: 06/08/2013 17:04:05 ET T: 06/08/2013 20:54:18 ET JOB#: [id number]  cc: Rolly PancakeVivek J. Cherlynn KaiserSainani, MD, <Dictator> Houston SirenVIVEK J Breah Joa MD ELECTRONICALLY SIGNED 06/14/2013 11:29

## 2014-09-08 NOTE — H&P (Signed)
PATIENT NAME:  Craig Rosales, Craig Rosales MR#:  [id number] DATE OF BIRTH:  10-26-1980  DATE OF ADMISSION:  06/07/2013  PRIMARY CARE PHYSICIAN:  Unknown.   CHIEF COMPLAINT: Vomiting and passing out.   HISTORY OF PRESENT ILNESS:  This is a 34 year old male with a history of diabetes, depression. He has had multiple hospitalizations for depression and was admitted in 2012 with a diagnosis of conversion disorder, who presents today after vomiting. The HPI is taken from the patient'Rosales blood rate is at bedside. Apparently, patient was acting kind of funny this morning, but he went out of with friends at a restaurant and then after the restaurant and was planning to come to [facility] for medical records. He vomited on his way here and then once he gets medical records, he also vomited. After he vomited, the supervising nurse was called to evaluate the patient. The patient, she says, was claiming gray, so he was brought here into the ER for further evaluation. While at triage, the patient just was not able to speak. Apparently, prior to this, the patient was able to speak,  walked into medical records.  Interestingly, when I am in the room,  the patient opens his eyes and will follow me and nods his head, but did not speak as if he is mute and does not really follow any commands but then I left the room and I had Dr. Toni Amendlapacs come in to evaluate the patient for possible conversion disorder and he writes something on a piece of paper and gives the piece of paper Dr. Toni Amendlapacs, which basically tells us what insulin doses that he is on.    REVIEW OF SYSTEMS: Unable to obtain from the patient, as he is mute.   PAST MEDICAL HISTORY:   1.  According to the medical chart, the patient had hospitalizations for depression, in 2012 he was diagnosed with conversion disorder.  2.  Diabetes.  3.  Diabetic neuropathy.   4.  Hypertension.  5.  Vitamin B deficiency.  6.  Asthma.  7.  Nephrolithiasis.  8.  GERD.  9.  Dyslipidemia.  10.   Chronic obstructive pulmonary disease.  11.  Morbid obesity.  12.  Chronic pain.   ALLERGIES: IBUPROFEN CAUSED A RASH AND LATEX.   MEDICATIONS: The patient was discharged in November with several medications, but actually are pharmacy tech called the pharmacy and the below medications are the ones he has had refilled:    1.  Abilify 30 mg daily.  2.  Clonazepam 0.5 mg t.i.d.  3.  Colcrys 0.6 mg b.i.d.  4.  Hydroxyzine hydrochloride 50 mg q. 4 hours p.r.n. anxiety.  5.  Levemir 70 units b.i.d.  6.  Synthroid 25 mcg daily.  7.  Methylphenidate 20 mg b.i.d.  8.  NovoLog 25 units daily, 40 units at lunch and 40 units in the evening.  9.  Proventil 2 puffs 4 times a day.   SOCIAL HISTORY: According to the medical chart. He is disabled following back surgery. He used to be a LawyerCNA. No tobacco, alcohol or IV drug use per the brother who is in the room.   FAMILY HISTORY: None reported.    PAST SURGICAL HISTORY: 1.  Back surgery.  2.  Right knee surgery.  3.  Tonsillectomy.   PHYSICAL EXAMINATION: VITAL SIGNS: Temperature 97.7, pulse 99, respirations 20, blood pressure 142/81, 100% on room air.  GENERAL: The patient opens his eyes, he follows but he is just mute.  HEENT:  Head  is atraumatic. Pupils are 3 mm, they are very sluggish. Sclerae anicteric. Mucous membranes are moist. Lips are plump.   NECK: Short. Hard to appreciate enlarged thyroid or JVD or carotid bruit due to his short neck.  CARDIOVASCULAR: Regular rate and rhythm. No murmurs, gallops or rubs. PMI is not displaced.    ABDOMEN: Obese, bowel sounds are positive, nontender. Hard to appreciate bowel sounds due to body habitus.  LUNGS:  Clear to auscultation without crackles, rales, rhonchi or wheezing.  This was heard anteriorly, I was unable to listen posteriorly.  EXTREMITIES: He has a brace on the right knee from his recent right knee surgery. No edema, clubbing or cyanosis.  MUSCULOSKELETAL: Unable to really do a good  musculoskeletal exam.  NEUROLOGIC: Unable to get a good neuro exam, but Babinski'Rosales are downgoing.  SKIN: Without rashes or lesions.   LABORATORY, DIAGNOSTIC AND RADIOLOGIC DATA: PH 7.39, pCO2 of 44, pO2 of 96 FiO2 28. White blood cells 8.4, hemoglobin 14.4, hematocrit 45, platelets 175, sodium 129, potassium 3.8, chloride 96, bicarbonate 24, BUN 9, creatinine 1.14, glucose 555, bilirubin 0.7, alkaline phosphatase 75, ALT 33, AST 22, total protein 8.3, albumin is 4.3. Troponin less than 0.02.   CT of the head shows no acute intracranial hemorrhage or CVA.    EKG: Sinus tachycardia. No elevations or depressions except.   ASSESSMENT AND PLAN: A 34 year old male who was in his usual state of health until after eating a restaurant, he had a vomiting episode. He vomited here in the medical records and was transferred to triage where he became mute and since that time he has mute.  1.  Encephalopathy. I think this is probably a conversion disorder. The patient is neurological exam focal. There are no localizing symptoms. He opens his eyes. He can write.  This just seems like a very weird kind of presentation for anything neurological going on.  His head CT is negative. We will continue to monitor well. I have ordered an EEG to rule out a seizure, but I doubt this is the case. If he continues to be like this we can order a repeat CT of the head. I did ask Dr. Toni Amend to see the patient regarding possible conversion disorder and Dr. Toni Amend feels most likely conversion disorder given his symptoms, but he will need closer monitoring.  2.  Hyperglycemia without acidosis. The patient will be placed in the stepdown unit on the Critical Care Unit hyperglycemia protocol. We will hold his insulin dosing right now, until his blood sugars are better controlled.  3.  Hypothyroidism. We will check TSH and continue Synthroid.  4.  Psychiatric issues as mentioned have consulted Dr. Toni Amend who will continue to see the  patient.  5.  Hyponatremia, which is actually secondary to hyperglycemia and corrected the sodium level is 134.   CODE STATUS:  Full code status, according to the brother.    CRITICAL CARE TIME SPENT: Approximately 50 minutes    ____________________________ Khoen Genet P. Juliene Pina, MD spm:cc D: 06/07/2013 17:41:42 ET T: 06/07/2013 18:12:38 ET JOB#: [id number]  cc: Becker Christopher P. Juliene Pina, MD, <Dictator> Janyth Contes Karolyne Timmons MD ELECTRONICALLY SIGNED 06/07/2013 20:38

## 2014-09-08 NOTE — Consult Note (Signed)
Brief Consult Note: Diagnosis: pseudo-neurologic symptom in patient with history of depression and histrionic personality.   Patient was seen by consultant.   Discussed with Attending MD.   Comments: Psychiatry: Patient seen in the emergency room. He presented today in the company of his brother with complaints of acute onset of nausea and vomiting accompanied by an inability to speak. Consultation because of this unusual symptom and the patient's history of psychiatric problems. History was obtained through interaction with the patient by pointing and gesturing and writing notes and also by communication with his family member. They indicate that he had a sudden onset of being mute today. There was no precipitating trauma. They denied any change in medications. Denies substance abuse. Patient has a past history of depression versus bipolar disorder but with a lot of histrionic behavior around it. He has presented in the past with very atypical symptoms and what seems to be off in a excessive demand for attention and normal TN medications. Nevertheless, there is no clear history of substance abuse. His current medications according to the chart consisted of Abilify, methylphenidate. Patient was alert when I came into the room. He made good eye contact and attempted to communicate. He pointed and gestured and wrote notes and clearly understood what I was saying to him. His affect appeared anxious. He did not appear to be obviously delirious. He was able to write out his correct insulin dosage and writet out correct answers to questions. He denied suicidal ideation and denied hallucinations.   Patient is married. He recently had been up in [place] visiting family. He also indicated to me that he had recently been in jail although I did not get the details.  Medical history: History of diabetes. History of gout. History of injury to the leg. Some chronic pain complaints at times. On brief neurologic  exam his face appeared to be symmetric with no sign of weakening in tone. His eye movements were full range with no evident change to his visual fields. His strength in his upper extremities was symmetric and normal. Likely psychosomatic factors.  Electronic Signatures: Audery Amellapacs, Ismar Yabut T (MD)  (Signed 21-Jan-15 23:27)  Authored: Brief Consult Note   Last Updated: 21-Jan-15 23:27 by Audery Amellapacs, Kathyleen Radice T (MD)

## 2014-09-08 NOTE — Consult Note (Signed)
Psychiatry: Patient seen. Patient now is awake and speaking lucidly. No sign of delirium. He held an appropriate conversation and showed good memory other than a claim that he can't remember being in the ER yesterday. He says his mood lately has been a little down and he has chronic trouble sleeping and low energy. He claims to have recently had some hallucinations and passive Suicidal ideation but denies current suicidal thoughts and is clear and free of any evidence of responding to internal stimuli. He was in jail recently and missed his meds a while which may have worsened symptoms. Still lives with wife and family is supportive. with a history of labile mood and behavior. Prob has a underlying mood disorder but also tends to be a bit histrionic and seeking of extra treatment and care. Has had episodes of psychosomatic symptoms in the past. Currently no sign of being dangerous. Encouraged patient to follow up with outpt care and stay on meds. No current need for sitter. From a psychiatric view he can be safely discharged home. Patient agrees with the assessment. DCed the 1:1 sitter. Advised Dr Quentin CornwallSanani of my assessment.  Electronic Signatures: Audery Amellapacs, John T (MD)  (Signed on 22-Jan-15 15:38)  Authored  Last Updated: 22-Jan-15 15:38 by Audery Amellapacs, John T (MD)

## 2014-10-01 IMAGING — CR DG CHEST 2V
1 series · 3 of 3 positions shown · non-contrast
Comparison: none

REASON FOR EXAM: chest pain
COMMENTS:   LMP: (Male)

[Series 1: w chest pa · 0.14mm/px · 3 of 3 slices shown]
[im 1/3]
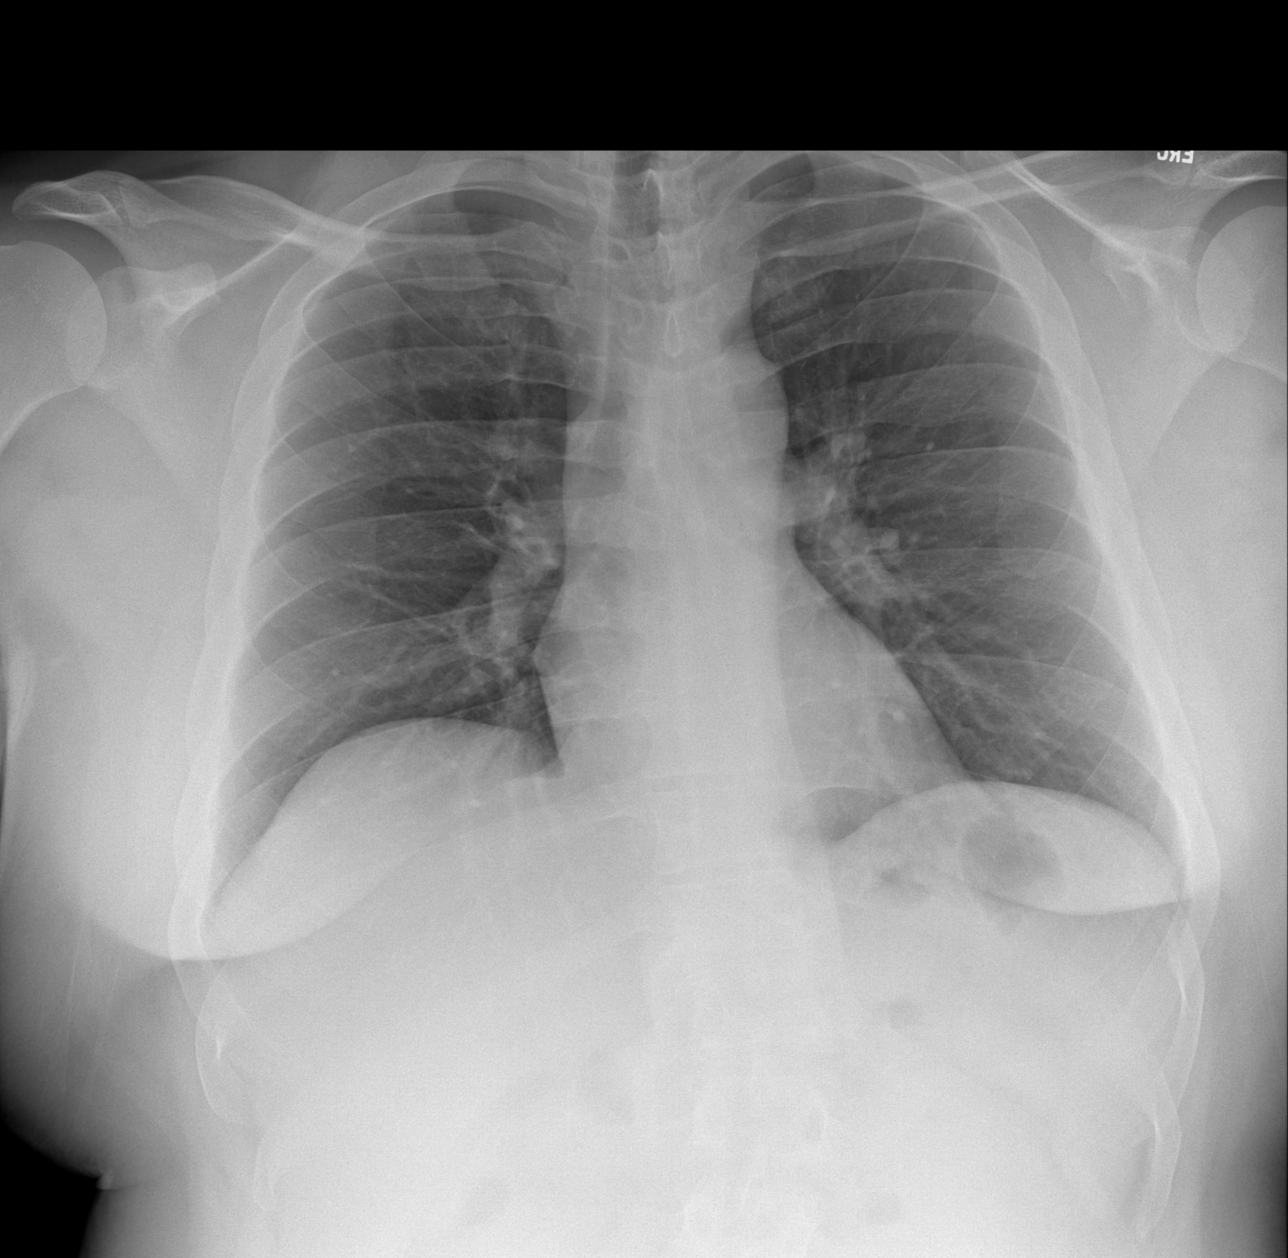
[im 2/3]
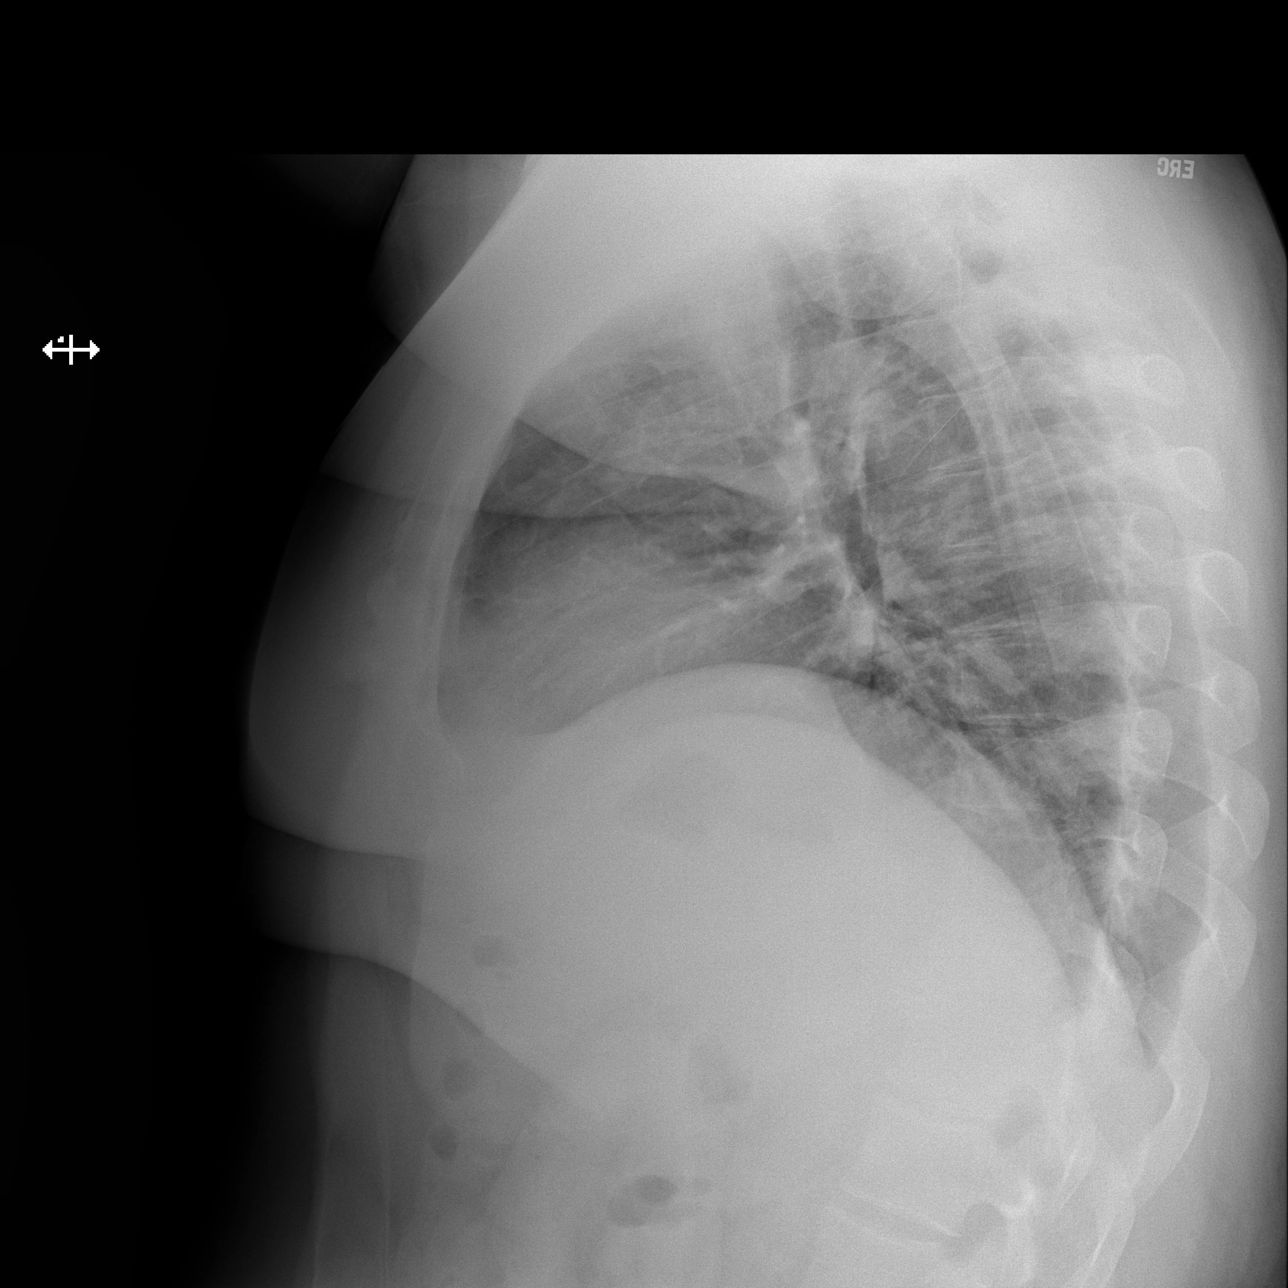
[im 3/3]
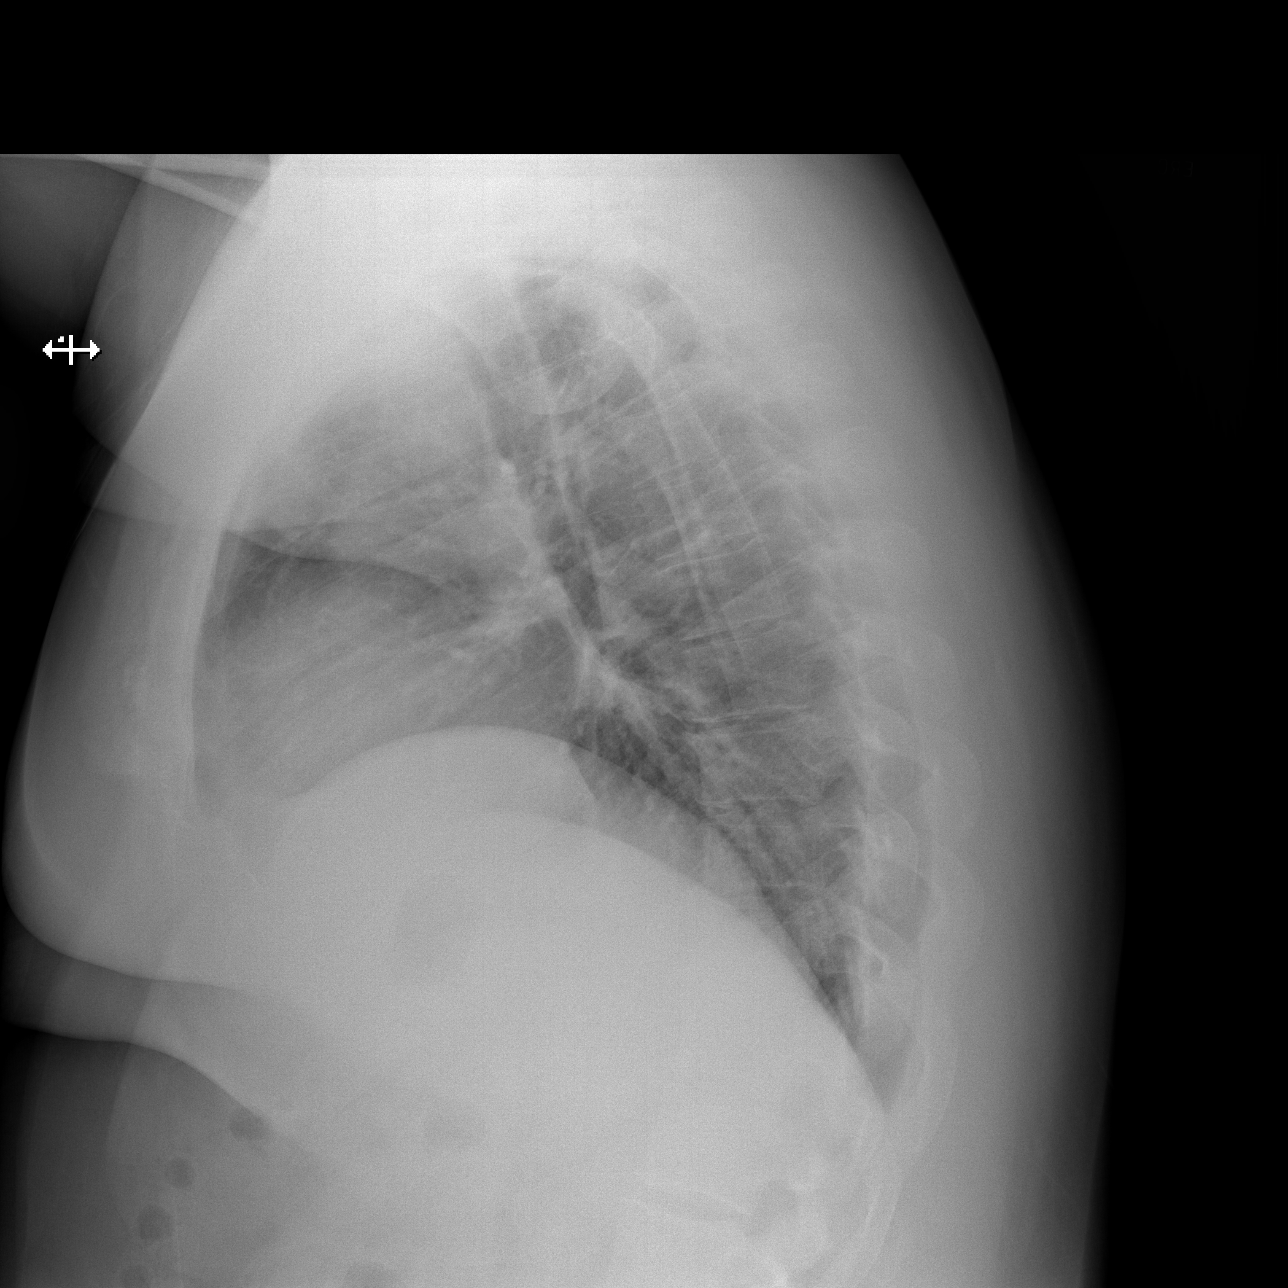

[3 of 3 positions shown; findings below may reference images not displayed]

PROCEDURE:     DXR - DXR CHEST PA (OR AP) AND LATERAL  - May 02, 2012  [DATE]

RESULT:     Comparison is made to the study 05 February, 2012.

The lungs are clear. The heart and pulmonary vessels are normal. The bony
and mediastinal structures are unremarkable. There is no effusion. There is
no pneumothorax or evidence of congestive failure.
IMPRESSION: No acute cardiopulmonary disease.

[REDACTED]

## 2014-12-24 DIAGNOSIS — M109 Gout, unspecified: Secondary | ICD-10-CM | POA: Insufficient documentation

## 2014-12-24 DIAGNOSIS — E039 Hypothyroidism, unspecified: Secondary | ICD-10-CM | POA: Insufficient documentation

## 2014-12-24 DIAGNOSIS — E611 Iron deficiency: Secondary | ICD-10-CM | POA: Insufficient documentation

## 2014-12-24 DIAGNOSIS — E7800 Pure hypercholesterolemia, unspecified: Secondary | ICD-10-CM | POA: Insufficient documentation

## 2014-12-24 DIAGNOSIS — Z8739 Personal history of other diseases of the musculoskeletal system and connective tissue: Secondary | ICD-10-CM | POA: Insufficient documentation

## 2014-12-24 DIAGNOSIS — E1140 Type 2 diabetes mellitus with diabetic neuropathy, unspecified: Secondary | ICD-10-CM | POA: Insufficient documentation

## 2014-12-24 DIAGNOSIS — R600 Localized edema: Secondary | ICD-10-CM | POA: Insufficient documentation

## 2014-12-25 DIAGNOSIS — J449 Chronic obstructive pulmonary disease, unspecified: Secondary | ICD-10-CM | POA: Insufficient documentation

## 2014-12-25 DIAGNOSIS — F319 Bipolar disorder, unspecified: Secondary | ICD-10-CM | POA: Insufficient documentation

## 2014-12-25 DIAGNOSIS — F909 Attention-deficit hyperactivity disorder, unspecified type: Secondary | ICD-10-CM | POA: Insufficient documentation

## 2014-12-26 DIAGNOSIS — E559 Vitamin D deficiency, unspecified: Secondary | ICD-10-CM | POA: Insufficient documentation

## 2015-03-05 DIAGNOSIS — F449 Dissociative and conversion disorder, unspecified: Secondary | ICD-10-CM | POA: Insufficient documentation

## 2015-06-13 DIAGNOSIS — G5603 Carpal tunnel syndrome, bilateral upper limbs: Secondary | ICD-10-CM | POA: Insufficient documentation

## 2015-07-25 DIAGNOSIS — R200 Anesthesia of skin: Secondary | ICD-10-CM | POA: Insufficient documentation

## 2016-01-28 DIAGNOSIS — Z9889 Other specified postprocedural states: Secondary | ICD-10-CM | POA: Insufficient documentation

## 2016-11-15 DIAGNOSIS — Z79899 Other long term (current) drug therapy: Secondary | ICD-10-CM | POA: Insufficient documentation

## 2017-02-16 ENCOUNTER — Encounter (HOSPITAL_COMMUNITY): Payer: Self-pay | Admitting: *Deleted

## 2017-02-16 ENCOUNTER — Emergency Department (HOSPITAL_COMMUNITY): Payer: Medicare Other

## 2017-02-16 ENCOUNTER — Emergency Department (HOSPITAL_COMMUNITY)
Admission: EM | Admit: 2017-02-16 | Discharge: 2017-02-16 | Disposition: A | Discharge status: Home / Self Care | Payer: Medicare Other | Attending: Emergency Medicine | Admitting: Emergency Medicine

## 2017-02-16 DIAGNOSIS — J189 Pneumonia, unspecified organism: Secondary | ICD-10-CM

## 2017-02-16 DIAGNOSIS — I10 Essential (primary) hypertension: Secondary | ICD-10-CM | POA: Diagnosis not present

## 2017-02-16 DIAGNOSIS — J45909 Unspecified asthma, uncomplicated: Secondary | ICD-10-CM | POA: Insufficient documentation

## 2017-02-16 DIAGNOSIS — R509 Fever, unspecified: Secondary | ICD-10-CM | POA: Diagnosis not present

## 2017-02-16 DIAGNOSIS — R51 Headache: Secondary | ICD-10-CM | POA: Diagnosis not present

## 2017-02-16 DIAGNOSIS — Z87891 Personal history of nicotine dependence: Secondary | ICD-10-CM | POA: Diagnosis not present

## 2017-02-16 DIAGNOSIS — Z79899 Other long term (current) drug therapy: Secondary | ICD-10-CM | POA: Diagnosis not present

## 2017-02-16 DIAGNOSIS — E119 Type 2 diabetes mellitus without complications: Secondary | ICD-10-CM | POA: Diagnosis not present

## 2017-02-16 DIAGNOSIS — J029 Acute pharyngitis, unspecified: Secondary | ICD-10-CM | POA: Diagnosis not present

## 2017-02-16 DIAGNOSIS — Z7982 Long term (current) use of aspirin: Secondary | ICD-10-CM | POA: Insufficient documentation

## 2017-02-16 DIAGNOSIS — J181 Lobar pneumonia, unspecified organism: Secondary | ICD-10-CM | POA: Diagnosis not present

## 2017-02-16 DIAGNOSIS — R05 Cough: Secondary | ICD-10-CM | POA: Diagnosis present

## 2017-02-16 DIAGNOSIS — Z794 Long term (current) use of insulin: Secondary | ICD-10-CM | POA: Diagnosis not present

## 2017-02-16 DIAGNOSIS — E1140 Type 2 diabetes mellitus with diabetic neuropathy, unspecified: Secondary | ICD-10-CM | POA: Diagnosis not present

## 2017-02-16 LAB — CBC WITH DIFFERENTIAL/PLATELET
Basophils Absolute: 0 10*3/uL (ref 0.0–0.1)
Basophils Relative: 0 %
EOS ABS: 0 10*3/uL (ref 0.0–0.7)
EOS PCT: 0 %
HEMATOCRIT: 40.4 % (ref 39.0–52.0)
Hemoglobin: 13.4 g/dL (ref 13.0–17.0)
LYMPHS ABS: 1.8 10*3/uL (ref 0.7–4.0)
Lymphocytes Relative: 26 %
MCH: 27.9 pg (ref 26.0–34.0)
MCHC: 33.2 g/dL (ref 30.0–36.0)
MCV: 84.2 fL (ref 78.0–100.0)
MONO ABS: 0.4 10*3/uL (ref 0.1–1.0)
MONOS PCT: 6 %
Neutro Abs: 4.6 10*3/uL (ref 1.7–7.7)
Neutrophils Relative %: 68 %
Platelets: 168 10*3/uL (ref 150–400)
RBC: 4.8 MIL/uL (ref 4.22–5.81)
RDW: 13.2 % (ref 11.5–15.5)
WBC: 6.8 10*3/uL (ref 4.0–10.5)

## 2017-02-16 LAB — BASIC METABOLIC PANEL
ANION GAP: 9 (ref 5–15)
BUN: 11 mg/dL (ref 6–20)
CHLORIDE: 101 mmol/L (ref 101–111)
CO2: 25 mmol/L (ref 22–32)
CREATININE: 0.92 mg/dL (ref 0.61–1.24)
Calcium: 9 mg/dL (ref 8.9–10.3)
GFR calc non Af Amer: >60 mL/min (ref >60)
Glucose, Bld: 263 mg/dL — ABNORMAL HIGH (ref 65–99)
POTASSIUM: 3.9 mmol/L (ref 3.5–5.1)
Sodium: 135 mmol/L (ref 135–145)

## 2017-02-16 MED ORDER — AZITHROMYCIN 250 MG PO TABS
500.0000 mg | ORAL_TABLET | Freq: Once | ORAL | Status: AC
  Administered 2017-02-16: 500 mg via ORAL
  Filled 2017-02-16: qty 2

## 2017-02-16 MED ORDER — ACETAMINOPHEN 325 MG PO TABS
650.0000 mg | ORAL_TABLET | Freq: Once | ORAL | Status: AC
  Administered 2017-02-16: 650 mg via ORAL
  Filled 2017-02-16: qty 2

## 2017-02-16 MED ORDER — DEXTROSE 5 % IV SOLN
1.0000 g | Freq: Once | INTRAVENOUS | Status: AC
  Administered 2017-02-16: 1 g via INTRAVENOUS
  Filled 2017-02-16: qty 10

## 2017-02-16 MED ORDER — AZITHROMYCIN 250 MG PO TABS: ORAL_TABLET | ORAL | 0 refills | Status: DC

## 2017-02-16 NOTE — ED Triage Notes (Signed)
Pt c/o cough, sob, and body aches for the last 4 days

## 2017-02-16 NOTE — ED Provider Notes (Signed)
[facility]Y [facility]PT Provider Note   CSN: [id number] Arrival date & time: 02/16/17  0503     History   Chief Complaint Chief Complaint  Patient presents with  . Cough    HPI Craig Rosales is a 36 y.o. male.  The history is provided by the patient.  Cough  This is a new problem. The current episode started more than 2 days ago. The problem occurs every few minutes. The problem has been gradually worsening. The cough is productive of sputum. The maximum temperature recorded prior to his arrival was 100 to 100.9 F. Associated symptoms include chest pain, chills, headaches, sore throat and shortness of breath. He is not a smoker. His past medical history is significant for asthma.   Pt with h/o obesity and asthma presents with fever/cough/shortness of breath He also reports HA/sore throat and myalgias Tonight he woke up and felt he could not catch his breath He tried using home nebulizers without improvement  Past Medical History:  Diagnosis Date  . Angina   . Anxiety   . Asthma   . DDD (degenerative disc disease), lumbar    Dr. Marikay Alar  . Diabetes mellitus   . Diabetic neuropathy (HCC)   . GERD (gastroesophageal reflux disease)   . History of renal calculi   . Hyperlipidemia, mild    ldl 129 (1293/425), HDL 48, TG 27 (verified). LDL goal <130, ideally 100  . Hypertension   . Migraine headache   . Obesity   . Pneumonia 04/2009  . Shortness of breath   . Sleep apnea     Patient Active Problem List   Diagnosis Date Noted  . Chest pain 07/04/2011  . Shortness of breath 07/04/2011  . OSA on CPAP 07/04/2011  . Hyperglycemia 07/04/2011  . OSA (obstructive sleep apnea) 10/01/2010  . HYPERGLYCEMIA, FASTING 06/06/2010  . FUNGAL DERMATITIS 05/21/2010  . DEHYDRATION 05/21/2010  . DEGENERATIVE DISC DISEASE, LUMBOSACRAL SPINE 02/03/2010  . DIZZINESS 02/03/2010  . GERD 11/08/2009  . HYPOKALEMIA 10/11/2009  . SMOKER 10/11/2009  . CONTACT DERMATITIS 10/11/2009  .  ABDOMINAL PAIN, EPIGASTRIC 10/11/2009  . LOW BACK PAIN, ACUTE 08/01/2009  . Thoracic or lumbosacral neuritis or radiculitis, unspecified 05/27/2009  . MIGRAINE HEADACHE 05/21/2009  . HYPERTENSION 05/21/2009  . ASTHMA 05/21/2009  . RENAL CALCULUS, HX OF 05/21/2009  . HYPERLIPIDEMIA, MILD, HX OF 05/21/2009    Past Surgical History:  Procedure Laterality Date  . BACK SURGERY  04/30/2010  . KIDNEY STONE SURGERY  2012  . KNEE ARTHROSCOPY  2010   Right  . LUMBAR FUSION  2011  . TONSILLECTOMY    . WISDOM TOOTH EXTRACTION  2012   x 4       Home Medications    Prior to Admission medications   Medication Sig Start Date End Date Taking? Authorizing Provider  acetaminophen (TYLENOL) 325 MG tablet Take 650 mg by mouth every 6 (six) hours as needed.   Yes [provider]  albuterol (PROVENTIL HFA) 108 (90 BASE) MCG/ACT inhaler Inhale 2 puffs into the lungs every 6 (six) hours as needed. For shortness of breath.   Yes [provider]  allopurinol (ZYLOPRIM) 100 MG tablet Take 100 mg by mouth daily.   Yes [provider]  Ascorbic Acid (VITAMIN C) 500 MG tablet Take 500 mg by mouth daily.     Yes [provider]  aspirin 81 MG tablet Take 81 mg by mouth daily.   Yes [provider]  atorvastatin (LIPITOR) 40  MG tablet Take 80 mg by mouth at bedtime.    Yes [provider]  cetirizine (ZYRTEC) 10 MG tablet Take 10 mg by mouth daily.   Yes [provider]  cholecalciferol (VITAMIN D) 1000 UNITS tablet Take 1,000 Units by mouth daily.   Yes [provider]  cyanocobalamin 1000 MCG tablet Take 1,000 mcg by mouth daily.   Yes [provider]  cyclobenzaprine (FLEXERIL) 10 MG tablet Take 5 mg by mouth 3 (three) times daily as needed. For muscle spasms.   Yes [provider]  ferrous gluconate (FERGON) 324 MG tablet Take 324 mg by mouth daily with breakfast.   Yes [provider]  FLUoxetine (PROZAC)  40 MG capsule Take 40 mg by mouth daily.   Yes [provider]  fluticasone (FLOVENT HFA) 44 MCG/ACT inhaler Inhale 2 puffs into the lungs 2 (two) times daily.   Yes [provider]  furosemide (LASIX) 40 MG tablet Take 40 mg by mouth daily.   Yes [provider]  insulin aspart (NOVOLOG) 100 UNIT/ML injection Inject 24 Units into the skin 3 (three) times daily with meals.  07/07/11 02/16/17 Yes Vassie Loll, MD  ipratropium-albuterol (DUONEB) 0.5-2.5 (3) MG/3ML SOLN Take 3 mLs by nebulization every 6 (six) hours as needed. For shortness of breath.   Yes [provider]  levothyroxine (SYNTHROID, LEVOTHROID) 25 MCG tablet Take 25 mcg by mouth daily before breakfast.   Yes [provider]  lithium carbonate 300 MG capsule Take 900 mg by mouth daily.   Yes [provider]  ranitidine (ZANTAC) 150 MG tablet Take 150 mg by mouth 2 (two) times daily.   Yes [provider]  Riboflavin (VITAMIN B2 PO) Take 500 mg by mouth daily.   Yes [provider]  sildenafil (VIAGRA) 100 MG tablet Take 100 mg by mouth as needed.   Yes [provider]  tamsulosin (FLOMAX) 0.4 MG CAPS capsule Take 0.4 mg by mouth daily after supper.   Yes [provider]  fluticasone (FLONASE) 50 MCG/ACT nasal spray Place 2 sprays into the nose daily.    [provider]  gabapentin (NEURONTIN) 300 MG capsule Take 1,200 mg by mouth 3 (three) times daily.     [provider]  insulin glargine (LANTUS) 100 UNIT/ML injection Inject 68 Units into the skin at bedtime.  07/07/11   Vassie Loll, MD  Insulin Pen Needle 31G X 6 MM MISC -1 pen needle with each application of insulin 07/07/11   Vassie Loll, MD  QUEtiapine (SEROQUEL) 400 MG tablet Take 800 mg by mouth at bedtime.     [provider]  vitamin B-12 (CYANOCOBALAMIN) 1000 MCG tablet Take 1,000 mcg by mouth daily.    [provider]    Family History Family  History  Problem Relation Age of Onset  . Hyperlipidemia Father   . Diabetes Father   . Hypertension Father   . COPD Father   . Diabetes Mother   . Hypertension Mother   . Hyperlipidemia Mother   . Heart attack Mother        x2  . Cancer Mother   . Heart disease Mother        MI x2  . Thyroid disease Brother   . Hypertension Sister   . Breast cancer Paternal Grandmother   . Heart attack Paternal Grandmother 56  . Cancer Paternal Grandmother        BREAST  . Heart disease Paternal Grandmother  MI IN 70'S  . Hypertension Maternal Uncle   . Kidney disease Maternal Uncle        KIDNEY FAILURE  . Diabetes Maternal Uncle   . Kidney failure Maternal Uncle   . Obesity Brother   . Anemia Sister   . Hyperlipidemia Sister     Social History Social History  Substance Use Topics  . Smoking status: Former Smoker    Packs/day: 0.50    Years: 7.00    Quit date: 05/18/2008  . Smokeless tobacco: Never Used  . Alcohol use 0.0 oz/week    2 - 3 drink(s) per week     Allergies   Latex and Ibuprofen   Review of Systems Review of Systems  Constitutional: Positive for chills and fever.  HENT: Positive for sore throat.   Respiratory: Positive for cough and shortness of breath.   Cardiovascular: Positive for chest pain.  Neurological: Positive for headaches.  All other systems reviewed and are negative.    Physical Exam Updated Vital Signs BP (!) 128/91 (BP Location: Left Arm)   Pulse 98   Temp 100.1 F (37.8 C) (Oral)   Resp 20   Ht 1.753 m ( )   Wt 129.3 kg (285 lb)   SpO2 96%   BMI 42.09 kg/m   Physical Exam CONSTITUTIONAL: Well developed/well nourished HEAD: Normocephalic/atraumatic EYES: EOMI/PERRL ENMT: Mucous membranes moist NECK: supple no meningeal signs SPINE/BACK:entire spine nontender CV: S1/S2 noted, no murmurs/rubs/gallops noted LUNGS: Lungs are clear to auscultation bilaterally, no apparent distress, limited due to body size No obvious  crackles/wheeze noted ABDOMEN: soft, nontender, obese GU:no cva tenderness NEURO: Pt is awake/alert/appropriate, moves all extremitiesx4.  No facial droop.   EXTREMITIES:   full ROM SKIN: warm, color normal PSYCH: no abnormalities of mood noted, alert and oriented to situation   ED Treatments / Results  Labs (all labs ordered are listed, but only abnormal results are displayed) Labs Reviewed  BASIC METABOLIC PANEL - Abnormal; Notable for the following:       Result Value   Glucose, Bld 263 (*)    All other components within normal limits  CBC WITH DIFFERENTIAL/PLATELET    EKG  EKG Interpretation None       Radiology Dg Chest 2 View  Result Date: 02/16/2017 CLINICAL DATA:  Productive cough, shortness of breath, body aches, and fever since Friday. EXAM: CHEST  2 VIEW COMPARISON:  Previous comparison studies are not available. FINDINGS: Normal heart size and pulmonary vascularity. Focal infiltration in the left lower lung may represent pneumonia. Right lung is clear. No blunting of costophrenic angles. No pneumothorax. Mediastinal contours appear intact. IMPRESSION: Focal infiltrate in the left lower lung suggesting pneumonia. Electronically Signed   By: Burman Nieves M.D.   On: 02/16/2017 05:37    Procedures Procedures (including critical care time)  Medications Ordered in ED Medications  acetaminophen (TYLENOL) tablet 650 mg (not administered)  cefTRIAXone (ROCEPHIN) 1 g in dextrose 5 % 50 mL IVPB (not administered)  azithromycin (ZITHROMAX) tablet 500 mg (not administered)     Initial Impression / Assessment and Plan / ED Course  I have reviewed the triage vital signs and the nursing notes.  Pertinent labs & imaging results that were available during my care of the patient were reviewed by me and considered in my medical decision making (see chart for details).     6:03 AM Pt with recent cough/congestion, now has pneumonia He is not distressed No hypoxia Plan  to treat in the  ED and reassess Anticipate discharge Of note, he is scheduled for bariatric surgery next week in [facility] He will need to call his surgeon later today about this new diagnosis  6:43 AM Pt stable No hypoxia No distress He is not septic appearing and no oxygen requirement He is appropriate for outpatient management Advised to hold seroquel until azithromycin is completed He will call his surgeon today and inform them he has pneumonia We discussed strict ER return precautions  Final Clinical Impressions(s) / ED Diagnoses   Final diagnoses:  Community acquired pneumonia of left lower lobe of lung (HCC)    New Prescriptions Discharge Medication List as of 02/16/2017  6:29 AM    START taking these medications   Details  azithromycin (ZITHROMAX) 250 MG tablet One tablet PO daily for 4 days, Print         Zadie Rhine, MD 02/16/17 [phone]

## 2017-03-15 DIAGNOSIS — Z9884 Bariatric surgery status: Secondary | ICD-10-CM | POA: Insufficient documentation

## 2017-03-15 DIAGNOSIS — E519 Thiamine deficiency, unspecified: Secondary | ICD-10-CM | POA: Insufficient documentation

## 2017-04-02 ENCOUNTER — Other Ambulatory Visit: Payer: Self-pay

## 2017-04-02 ENCOUNTER — Encounter (HOSPITAL_COMMUNITY): Payer: Self-pay | Admitting: Emergency Medicine

## 2017-04-02 DIAGNOSIS — Z794 Long term (current) use of insulin: Secondary | ICD-10-CM | POA: Diagnosis not present

## 2017-04-02 DIAGNOSIS — E119 Type 2 diabetes mellitus without complications: Secondary | ICD-10-CM | POA: Insufficient documentation

## 2017-04-02 DIAGNOSIS — E876 Hypokalemia: Secondary | ICD-10-CM | POA: Insufficient documentation

## 2017-04-02 DIAGNOSIS — Z9884 Bariatric surgery status: Secondary | ICD-10-CM | POA: Diagnosis not present

## 2017-04-02 DIAGNOSIS — Z91040 Latex allergy status: Secondary | ICD-10-CM | POA: Diagnosis not present

## 2017-04-02 DIAGNOSIS — F419 Anxiety disorder, unspecified: Secondary | ICD-10-CM | POA: Diagnosis not present

## 2017-04-02 DIAGNOSIS — Z87891 Personal history of nicotine dependence: Secondary | ICD-10-CM | POA: Diagnosis not present

## 2017-04-02 DIAGNOSIS — E860 Dehydration: Secondary | ICD-10-CM | POA: Diagnosis not present

## 2017-04-02 DIAGNOSIS — R1084 Generalized abdominal pain: Secondary | ICD-10-CM | POA: Insufficient documentation

## 2017-04-02 DIAGNOSIS — Z79899 Other long term (current) drug therapy: Secondary | ICD-10-CM | POA: Insufficient documentation

## 2017-04-02 DIAGNOSIS — J45909 Unspecified asthma, uncomplicated: Secondary | ICD-10-CM | POA: Diagnosis not present

## 2017-04-02 DIAGNOSIS — I10 Essential (primary) hypertension: Secondary | ICD-10-CM | POA: Insufficient documentation

## 2017-04-02 DIAGNOSIS — Z7982 Long term (current) use of aspirin: Secondary | ICD-10-CM | POA: Diagnosis not present

## 2017-04-02 NOTE — ED Triage Notes (Signed)
Pt c/o abd pain and lower legs cramping x 2 days.

## 2017-04-03 ENCOUNTER — Emergency Department (HOSPITAL_COMMUNITY)
Admission: EM | Admit: 2017-04-03 | Discharge: 2017-04-03 | Disposition: A | Discharge status: Home / Self Care | Payer: Medicare Other | Attending: Emergency Medicine | Admitting: Emergency Medicine

## 2017-04-03 ENCOUNTER — Emergency Department (HOSPITAL_COMMUNITY): Payer: Medicare Other

## 2017-04-03 DIAGNOSIS — E860 Dehydration: Secondary | ICD-10-CM

## 2017-04-03 DIAGNOSIS — R1084 Generalized abdominal pain: Secondary | ICD-10-CM | POA: Diagnosis not present

## 2017-04-03 DIAGNOSIS — E876 Hypokalemia: Secondary | ICD-10-CM

## 2017-04-03 LAB — URINALYSIS, ROUTINE W REFLEX MICROSCOPIC
Bilirubin Urine: NEGATIVE
GLUCOSE, UA: NEGATIVE mg/dL
HGB URINE DIPSTICK: NEGATIVE
KETONES UR: 20 mg/dL — AB
LEUKOCYTES UA: NEGATIVE
Nitrite: NEGATIVE
PH: 5 (ref 5.0–8.0)
Protein, ur: NEGATIVE mg/dL
Specific Gravity, Urine: 1.043 — ABNORMAL HIGH (ref 1.005–1.030)

## 2017-04-03 LAB — COMPREHENSIVE METABOLIC PANEL
ALBUMIN: 4.3 g/dL (ref 3.5–5.0)
ALT: 25 U/L (ref 17–63)
AST: 27 U/L (ref 15–41)
Alkaline Phosphatase: 67 U/L (ref 38–126)
Anion gap: 9 (ref 5–15)
BUN: 8 mg/dL (ref 6–20)
CHLORIDE: 107 mmol/L (ref 101–111)
CO2: 24 mmol/L (ref 22–32)
CREATININE: 0.84 mg/dL (ref 0.61–1.24)
Calcium: 9.3 mg/dL (ref 8.9–10.3)
GFR calc Af Amer: >60 mL/min (ref >60)
Glucose, Bld: 160 mg/dL — ABNORMAL HIGH (ref 65–99)
POTASSIUM: 3 mmol/L — AB (ref 3.5–5.1)
SODIUM: 140 mmol/L (ref 135–145)
Total Bilirubin: 1.4 mg/dL — ABNORMAL HIGH (ref 0.3–1.2)
Total Protein: 7.3 g/dL (ref 6.5–8.1)

## 2017-04-03 LAB — LITHIUM LEVEL: Lithium Lvl: <0.06 mmol/L — ABNORMAL LOW (ref 0.60–1.20)

## 2017-04-03 LAB — CBC
HEMATOCRIT: 42.5 % (ref 39.0–52.0)
Hemoglobin: 13.6 g/dL (ref 13.0–17.0)
MCH: 27.4 pg (ref 26.0–34.0)
MCHC: 32 g/dL (ref 30.0–36.0)
MCV: 85.5 fL (ref 78.0–100.0)
Platelets: 156 10*3/uL (ref 150–400)
RBC: 4.97 MIL/uL (ref 4.22–5.81)
RDW: 13.9 % (ref 11.5–15.5)
WBC: 5.8 10*3/uL (ref 4.0–10.5)

## 2017-04-03 LAB — CK: Total CK: 464 U/L — ABNORMAL HIGH (ref 49–397)

## 2017-04-03 LAB — LIPASE, BLOOD: LIPASE: 25 U/L (ref 11–51)

## 2017-04-03 MED ORDER — IOPAMIDOL (ISOVUE-300) INJECTION 61%
100.0000 mL | Freq: Once | INTRAVENOUS | Status: AC | PRN
  Administered 2017-04-03: 100 mL via INTRAVENOUS

## 2017-04-03 MED ORDER — FENTANYL CITRATE (PF) 100 MCG/2ML IJ SOLN
50.0000 ug | Freq: Once | INTRAMUSCULAR | Status: AC
  Administered 2017-04-03: 50 ug via INTRAVENOUS
  Filled 2017-04-03: qty 2

## 2017-04-03 MED ORDER — POTASSIUM CHLORIDE CRYS ER 20 MEQ PO TBCR
40.0000 meq | EXTENDED_RELEASE_TABLET | Freq: Once | ORAL | Status: AC
  Administered 2017-04-03: 40 meq via ORAL
  Filled 2017-04-03: qty 2

## 2017-04-03 MED ORDER — POTASSIUM CHLORIDE CRYS ER 20 MEQ PO TBCR: 20.0000 meq | EXTENDED_RELEASE_TABLET | Freq: Every day | ORAL | 0 refills | Status: AC

## 2017-04-03 MED ORDER — SODIUM CHLORIDE 0.9 % IV BOLUS (SEPSIS)
1000.0000 mL | Freq: Once | INTRAVENOUS | Status: AC
  Administered 2017-04-03: 1000 mL via INTRAVENOUS

## 2017-04-03 NOTE — Discharge Instructions (Signed)

## 2017-04-03 NOTE — ED Provider Notes (Signed)
[facility] Provider Note   CSN: [id number] Arrival date & time: 04/02/17  2249     History   Chief Complaint Chief Complaint  Patient presents with  . Abdominal Pain    HPI Craig Rosales is a 36 y.o. male.  The history is provided by the patient.  Abdominal Pain   This is a new problem. The current episode started more than 2 days ago. The problem occurs daily. The problem has been gradually worsening. The pain is associated with a previous surgery. The pain is located in the generalized abdominal region. The pain is moderate. Associated symptoms include diarrhea, nausea and vomiting. Pertinent negatives include fever, hematochezia and dysuria. The symptoms are aggravated by certain positions. Nothing relieves the symptoms.  patient with h/o Asthma, obesity, diabetes He is s/p laparoscopic gastric bypass in October He reports for past 4 days he has had nausea/vomiting/diarrhe and diffuse abdominal pain No fever No cp/sob He reports diffuse leg cramps and weakness, worsened over the past day He reports difficulty walking due to pain/weakness  Past Medical History:  Diagnosis Date  . Angina   . Anxiety   . Asthma   . DDD (degenerative disc disease), lumbar    Dr. Marikay Alaravid Jones  . Diabetes mellitus   . Diabetic neuropathy (HCC)   . GERD (gastroesophageal reflux disease)   . History of renal calculi   . Hyperlipidemia, mild    ldl 129 (1293/425), HDL 48, TG 27 (verified). LDL goal <130, ideally 100  . Hypertension   . Migraine headache   . Obesity   . Pneumonia 04/2009  . Shortness of breath   . Sleep apnea     Patient Active Problem List   Diagnosis Date Noted  . Chest pain 07/04/2011  . Shortness of breath 07/04/2011  . OSA on CPAP 07/04/2011  . Hyperglycemia 07/04/2011  . OSA (obstructive sleep apnea) 10/01/2010  . HYPERGLYCEMIA, FASTING 06/06/2010  . FUNGAL DERMATITIS 05/21/2010  . DEHYDRATION 05/21/2010  . DEGENERATIVE DISC  DISEASE, LUMBOSACRAL SPINE 02/03/2010  . DIZZINESS 02/03/2010  . GERD 11/08/2009  . HYPOKALEMIA 10/11/2009  . SMOKER 10/11/2009  . CONTACT DERMATITIS 10/11/2009  . ABDOMINAL PAIN, EPIGASTRIC 10/11/2009  . LOW BACK PAIN, ACUTE 08/01/2009  . Thoracic or lumbosacral neuritis or radiculitis, unspecified 05/27/2009  . MIGRAINE HEADACHE 05/21/2009  . HYPERTENSION 05/21/2009  . ASTHMA 05/21/2009  . RENAL CALCULUS, HX OF 05/21/2009  . HYPERLIPIDEMIA, MILD, HX OF 05/21/2009    Past Surgical History:  Procedure Laterality Date  . BACK SURGERY  04/30/2010  . GASTRIC BYPASS    . KIDNEY STONE SURGERY  2012  . KNEE ARTHROSCOPY  2010   Right  . LUMBAR FUSION  2011  . TONSILLECTOMY    . WISDOM TOOTH EXTRACTION  2012   x 4       Home Medications    Prior to Admission medications   Medication Sig Start Date End Date Taking? Authorizing Provider  acetaminophen (TYLENOL) 325 MG tablet Take 650 mg by mouth every 6 (six) hours as needed.    [provider]  albuterol (PROVENTIL HFA) 108 (90 BASE) MCG/ACT inhaler Inhale 2 puffs into the lungs every 6 (six) hours as needed. For shortness of breath.    [provider]  allopurinol (ZYLOPRIM) 100 MG tablet Take 100 mg by mouth daily.    [provider]  Ascorbic Acid (VITAMIN C) 500 MG tablet Take 500 mg by mouth daily.      [provider]  aspirin 81 MG tablet Take 81 mg by mouth daily.    [provider]  atorvastatin (LIPITOR) 40 MG tablet Take 80 mg by mouth at bedtime.     [provider]  azithromycin (ZITHROMAX) 250 MG tablet One tablet PO daily for 4 days 02/16/17   Zadie Rhine, MD  cetirizine (ZYRTEC) 10 MG tablet Take 10 mg by mouth daily.    [provider]  cholecalciferol (VITAMIN D) 1000 UNITS tablet Take 1,000 Units by mouth daily.    [provider]  cyanocobalamin 1000 MCG tablet Take 1,000 mcg by mouth daily.    [provider]  cyclobenzaprine  (FLEXERIL) 10 MG tablet Take 5 mg by mouth 3 (three) times daily as needed. For muscle spasms.    [provider]  ferrous gluconate (FERGON) 324 MG tablet Take 324 mg by mouth daily with breakfast.    [provider]  FLUoxetine (PROZAC) 40 MG capsule Take 40 mg by mouth daily.    [provider]  fluticasone (FLONASE) 50 MCG/ACT nasal spray Place 2 sprays into the nose daily.    [provider]  fluticasone (FLOVENT HFA) 44 MCG/ACT inhaler Inhale 2 puffs into the lungs 2 (two) times daily.    [provider]  furosemide (LASIX) 40 MG tablet Take 40 mg by mouth daily.    [provider]  gabapentin (NEURONTIN) 300 MG capsule Take 1,200 mg by mouth 3 (three) times daily.     [provider]  insulin aspart (NOVOLOG) 100 UNIT/ML injection Inject 24 Units into the skin 3 (three) times daily with meals.  07/07/11 02/16/17  Vassie Loll, MD  insulin glargine (LANTUS) 100 UNIT/ML injection Inject 68 Units into the skin at bedtime.  07/07/11   Vassie Loll, MD  Insulin Pen Needle 31G X 6 MM MISC -1 pen needle with each application of insulin 07/07/11   Vassie Loll, MD  ipratropium-albuterol (DUONEB) 0.5-2.5 (3) MG/3ML SOLN Take 3 mLs by nebulization every 6 (six) hours as needed. For shortness of breath.    [provider]  levothyroxine (SYNTHROID, LEVOTHROID) 25 MCG tablet Take 25 mcg by mouth daily before breakfast.    [provider]  lithium carbonate 300 MG capsule Take 900 mg by mouth daily.    [provider]  QUEtiapine (SEROQUEL) 400 MG tablet Take 800 mg by mouth at bedtime.     [provider]  ranitidine (ZANTAC) 150 MG tablet Take 150 mg by mouth 2 (two) times daily.    [provider]  Riboflavin (VITAMIN B2 PO) Take 500 mg by mouth daily.    [provider]  sildenafil (VIAGRA) 100 MG tablet Take 100 mg by mouth as needed.    [provider]  tamsulosin (FLOMAX)  0.4 MG CAPS capsule Take 0.4 mg by mouth daily after supper.    [provider]  vitamin B-12 (CYANOCOBALAMIN) 1000 MCG tablet Take 1,000 mcg by mouth daily.    [provider]    Family History Family History  Problem Relation Age of Onset  . Hyperlipidemia Father   . Diabetes Father   . Hypertension Father   . COPD Father   . Diabetes Mother   . Hypertension Mother   . Hyperlipidemia Mother   . Heart attack Mother        x2  . Cancer Mother   . Heart disease Mother        MI x2  . Thyroid  disease Brother   . Hypertension Sister   . Breast cancer Paternal Grandmother   . Heart attack Paternal Grandmother [id number]  . Cancer Paternal Grandmother        BREAST  . Heart disease Paternal Grandmother        MI IN 8370'S  . Hypertension Maternal Uncle   . Kidney disease Maternal Uncle        KIDNEY FAILURE  . Diabetes Maternal Uncle   . Kidney failure Maternal Uncle   . Obesity Brother   . Anemia Sister   . Hyperlipidemia Sister     Social History Social History   Tobacco Use  . Smoking status: Former Smoker    Packs/day: 0.50    Years: 7.00    Pack years: 3.50    Last attempt to quit: 05/18/2008    Years since quitting: 8.8  . Smokeless tobacco: Never Used  Substance Use Topics  . Alcohol use: No    Alcohol/week: 0.0 oz    Types: 2 - 3 Standard drinks or equivalent per week    Frequency: Never  . Drug use: No     Allergies   Latex and Ibuprofen   Review of Systems Review of Systems  Constitutional: Negative for fever.  Respiratory: Negative for shortness of breath.   Cardiovascular: Negative for chest pain.  Gastrointestinal: Positive for abdominal pain, diarrhea, nausea and vomiting. Negative for hematochezia.  Genitourinary: Negative for dysuria.  All other systems reviewed and are negative.    Physical Exam Updated Vital Signs BP 121/88   Pulse 75   Temp 98.7 F (37.1 C)   Resp 18   Ht 1.753 m (5\' 9" )   Wt 108.9 kg (240 lb)   SpO2  99%   BMI 35.44 kg/m   Physical Exam  CONSTITUTIONAL: Well developed/well nourished, smells ketotic HEAD: Normocephalic/atraumatic EYES: EOMI/PERRL, no icterus ENMT: Mucous membranes moist NECK: supple no meningeal signs SPINE/BACK:entire spine nontender CV: S1/S2 noted, no murmurs/rubs/gallops noted LUNGS: Lungs are clear to auscultation bilaterally, no apparent distress ABDOMEN: soft, obese, diffuse mild tenderness GU:no cva tenderness NEURO: Pt is awake/alert/appropriate.  He reports difficulty moving legs due to pain, able to wiggles toes without difficulty EXTREMITIES: pulses normal/equal, full ROM, diffuse tenderness to bilateral thighs, no LE edema noted SKIN: warm, color normal PSYCH: no abnormalities of mood noted, alert and oriented to situation  ED Treatments / Results  Labs (all labs ordered are listed, but only abnormal results are displayed) Labs Reviewed  COMPREHENSIVE METABOLIC PANEL - Abnormal; Notable for the following components:      Result Value   Potassium 3.0 (*)    Glucose, Bld 160 (*)    Total Bilirubin 1.4 (*)    All other components within normal limits  URINALYSIS, ROUTINE W REFLEX MICROSCOPIC - Abnormal; Notable for the following components:   Specific Gravity, Urine 1.043 (*)    Ketones, ur 20 (*)    All other components within normal limits  LITHIUM LEVEL - Abnormal; Notable for the following components:   Lithium Lvl <0.06 (*)    All other components within normal limits  CK - Abnormal; Notable for the following components:   Total CK 464 (*)    All other components within normal limits  LIPASE, BLOOD  CBC    EKG  EKG Interpretation None       Radiology Ct Abdomen Pelvis W Contrast  Result Date: 04/03/2017 CLINICAL DATA:  Acute onset of right hip and lower back pain, radiating down  the leg. EXAM: CT ABDOMEN AND PELVIS WITH CONTRAST TECHNIQUE: Multidetector CT imaging of the abdomen and pelvis was performed using the standard  protocol following bolus administration of intravenous contrast. CONTRAST:  ISOVUE-300 IOPAMIDOL (ISOVUE-300) INJECTION 61% COMPARISON:  CT of the abdomen and pelvis from 09/24/2016 FINDINGS: Lower chest: The visualized lung bases are grossly clear. The visualized portions of the mediastinum are unremarkable. Hepatobiliary: The liver is unremarkable in appearance. The gallbladder is unremarkable in appearance. The common bile duct remains normal in caliber. Pancreas: The pancreas is within normal limits. Spleen: The spleen is unremarkable in appearance. Adrenals/Urinary Tract: The adrenal glands are unremarkable in appearance. Minimal left-sided hydronephrosis is noted, without evidence of a distal obstructing stone. This may reflect a recently passed stone. A nonobstructing 5 mm stone is noted at the lower pole of the left kidney. No perinephric stranding is seen. Stomach/Bowel: The distal stomach is decompressed. Apparent wall thickening at the antrum of the stomach is thought to reflect relative decompression. The gastrojejunal anastomosis is unremarkable in appearance. The small bowel is unremarkable in appearance. The appendix is normal in caliber, without evidence of appendicitis. The colon is unremarkable in appearance. Vascular/Lymphatic: The abdominal aorta is unremarkable in appearance. The inferior vena cava is grossly unremarkable. No retroperitoneal lymphadenopathy is seen. No pelvic sidewall lymphadenopathy is identified. Reproductive: The bladder is mildly distended and grossly unremarkable. The prostate remains normal in size. Other: A small umbilical hernia is noted, containing only fat. Musculoskeletal: No acute osseous abnormalities are identified. The patient is status post lumbar spinal fusion at L4-L5. The visualized musculature is unremarkable in appearance. IMPRESSION: 1. Minimal left-sided hydronephrosis, without evidence of a distal obstructing stone. This may reflect a recently  passed stone. 2. Nonobstructing 5 mm stone at the lower pole of the left kidney. 3. Small umbilical hernia, containing only fat. Electronically Signed   By: Roanna Raider M.D.   On: 04/03/2017 02:47    Procedures Procedures    Medications Ordered in ED Medications  potassium chloride SA (K-DUR,KLOR-CON) CR tablet 40 mEq (not administered)  sodium chloride 0.9 % bolus 1,000 mL (0 mLs Intravenous Stopped 04/03/17 0319)  fentaNYL (SUBLIMAZE) injection 50 mcg (50 mcg Intravenous Given 04/03/17 0047)  iopamidol (ISOVUE-300) 61 % injection 100 mL (100 mLs Intravenous Contrast Given 04/03/17 0159)  fentaNYL (SUBLIMAZE) injection 50 mcg (50 mcg Intravenous Given 04/03/17 0220)  sodium chloride 0.9 % bolus 1,000 mL (1,000 mLs Intravenous New Bag/Given 04/03/17 0318)     Initial Impression / Assessment and Plan / ED Course  I have reviewed the triage vital signs and the nursing notes.  Pertinent labs  results that were available during my care of the patient were reviewed by me and considered in my medical decision making (see chart for details).     1:21 AM Pt will need Ct imaging due to abd pain and recent surgery However will get labs first as I am suspicious for dehydration possible HYPOkalemia 4:53 AM Pt improved He can now ambulate Mild HYPOkalemia noted, will give Rx for this CT imaging does not reveal any complications from recent surgery Possible recently passed kidney stone  Will d/c home Pt agreeable Final Clinical Impressions(s) / ED Diagnoses   Final diagnoses:  Generalized abdominal pain  Hypokalemia  Dehydration    ED Discharge Orders        Ordered    potassium chloride SA (K-DUR,KLOR-CON) 20 MEQ tablet  Daily     04/03/17 0451  Zadie Rhine, MD 04/03/17 [phone]

## 2017-04-03 NOTE — ED Notes (Addendum)
Pt ambulatory to wheelchair. Pt wheeled to waiting room. Pt verbalized understanding of discharge instructions.

## 2017-04-03 NOTE — ED Notes (Signed)
Checked with pt for urine sample,pt can't go right now,urine at bedside,will check back in 30 minutes.

## 2017-06-08 DIAGNOSIS — J309 Allergic rhinitis, unspecified: Secondary | ICD-10-CM | POA: Insufficient documentation

## 2021-06-12 ENCOUNTER — Encounter: Payer: Self-pay | Admitting: Nurse Practitioner

## 2021-06-27 ENCOUNTER — Other Ambulatory Visit: Payer: Self-pay

## 2021-06-30 ENCOUNTER — Encounter: Payer: Self-pay | Admitting: Nurse Practitioner

## 2021-06-30 ENCOUNTER — Ambulatory Visit (INDEPENDENT_AMBULATORY_CARE_PROVIDER_SITE_OTHER): Payer: Commercial Managed Care - PPO | Admitting: Nurse Practitioner

## 2021-06-30 ENCOUNTER — Other Ambulatory Visit (INDEPENDENT_AMBULATORY_CARE_PROVIDER_SITE_OTHER): Payer: Commercial Managed Care - PPO

## 2021-06-30 VITALS — BP 106/80 | HR 76 | Ht 69.0 in | Wt 180.0 lb

## 2021-06-30 DIAGNOSIS — R194 Change in bowel habit: Secondary | ICD-10-CM

## 2021-06-30 DIAGNOSIS — R7989 Other specified abnormal findings of blood chemistry: Secondary | ICD-10-CM

## 2021-06-30 DIAGNOSIS — R1010 Upper abdominal pain, unspecified: Secondary | ICD-10-CM

## 2021-06-30 MED ORDER — NA SULFATE-K SULFATE-MG SULF 17.5-3.13-1.6 GM/177ML PO SOLN: 1.0000 | ORAL | 0 refills | Status: DC

## 2021-06-30 NOTE — Patient Instructions (Signed)
PROCEDURES: You have been scheduled for a colonoscopy. Please follow the written instructions given to you at your visit today. Please pick up your prep supplies at the pharmacy within the next 1-3 days. If you use inhalers (even only as needed), please bring them with you on the day of your procedure.  LABS:   Please proceed to the basement level for lab work before leaving today. Press "B" on the elevator. The lab is located at the first door on the left as you exit the elevator.  BMI:  If you are age 41 or older, your body mass index should be between 23-30. Your Body mass index is 26.58 kg/m. If this is out of the aforementioned range listed, please consider follow up with your Primary Care Provider.  If you are age 52 or younger, your body mass index should be between 19-25. Your Body mass index is 26.58 kg/m. If this is out of the aformentioned range listed, please consider follow up with your Primary Care Provider.   MY CHART:  The [facility] providers would like to encourage you to use [facility] to communicate with providers for non-urgent requests or questions.  Due to long hold times on the telephone, sending your provider a message by [facility] may be a faster and more efficient way to get a response.  Please allow 48 business hours for a response.  Please remember that this is for non-urgent requests.   Thank you for trusting me with your gastrointestinal care!    Willette Cluster, NP

## 2021-06-30 NOTE — Progress Notes (Signed)
ASSESSMENT AND PLAN    # 41 year old male , last seen here in 2012. Here for a hospital follow up.  Hospitalized the end of January in [place], [place] for upper abdominal pain, elevated liver tests and lipase elevation < 2 x ULN.  CTAP suggested mild wall thickening, no pancreatitis or liver abnormalities. Inpatient EGD was normal. LFTs trended down but still elevated at time of hospital discharge in late January. Inpatient MRCP was negative  Etiology of ongoing upper abdominal pain unclear.  His is s/p remote cholecystectomy ( gallstones). Suppose he could have passed a gallstone but doesn't quite fit because he is still having the same upper abdominal pain radiating through to his back. Wonder if Trulicity may be contributing to abdominal pain?  --Obtain lipase, LFTs today  #  Bowel habit changes with constipation alternating with diarrhea over the last year. Occasional rectal bleeding. Altered bowel habits could be related to medications --Colonoscopy to evaluate bowel changes. Will schedule this out several weeks, hopefully by then we will have resolved above. Schedule for a colonoscopy. The risks and benefits of colonoscopy with possible polypectomy / biopsies were discussed and the patient agrees to proceed.    HISTORY OF PRESENT ILLNESS     Chief Complaint : upper abdominal pain, constipation, diarrhea, liver tests abnormal.   Craig Rosales is a 41 y.o. male with a past medical history significant diabetes, depression, hypothyroidism, gout, GERD, HTH, kidney stones, gastric bypass in [place], cholecystectomy. See PMH below for any additional history.   Craig Rosales saw Dr. Christella Hartigan in 2012 for constipation, hasn't been seen here since.   Referred by Sharene Butters, P.A.-C. Recently discharged from [facility] and told to follow up with GI closer to home. Patient lives in [place] Admission 06/08/21 to 06/11/21 Patient was hospitalzed in [facility] in late January with upper  abdominal pain. He been seen by bariatric medicine for possible ulcer disease on 02/26/2020 but symptoms persisted despite Omeprazole and carafate. Records reviewed in Care Everywhere. He presented to [facility] on 1/22 with severe mid upper abdominal pain.  On day of admission CBC was normal, lipase was 91 TBili 1.6, AST 57, ALT 67.  CT w/ contrast >> mild wall thickening of the stomach raising the possibility  of gastritis.  On 1/23 LFT significantly increased to Tbili 2.6, AST 351, ALT 375 and alkaline phosphatase 156.  MRCP >> unremarkable. LFT down trended.  Inpatient EGD was unremarkable.    By the time of discharge 06/11/2021 T. bili was down to 1.4, AST 71, ALT 218, alk phos 139.   H. pylori stool antigen negative \   INTERVAL HISTORY :   Patient says he never got an answer as to what is causing the upper abdominal pain .  He continues to have the pain radiating through to his back. It is constant, exacerbated by eating and drinking. He denies any NSAID use except for a daily baby asa.  He says he has been taking Zantac BID gastric bypass in 2016. No associated nausea. No weight loss.   Additionally he gives a year of so history of alternating loose stool / constipation. He hasn't had a normal BM in a year. He sometmes strains which leads to bleeding. He takes Imodium and pepto. He has been on iron since gastric bypass.    Regarding liver tests:  Reviewing labs in Care Everywhere his liver test were normal in January 2019, October 2021 and December 2021 .  Prior to them becoming abnormal in January he doesn't recall starting any new medications other than Trulicity which he started about 12 weeks ago. He doesn't consume much Etoh. He had a cholecystectomy several years ago, apparently had gallstones.    Maternal grandmother had colon cancer in her 20's.    Data Reviewed:  CBC Latest Ref Rng & Units 04/03/2017 02/16/2017 09/24/2013  WBC 4.0 - 10.5 K/uL 5.8 6.8 8.4  Hemoglobin 13.0  - 17.0 g/dL 13.6 13.4 13.6  Hematocrit 39.0 - 52.0 % 42.5 40.4 40.9  Platelets 150 - 400 K/uL 156 168 168    Lab Results  Component Value Date   LIPASE 25 04/03/2017   CMP Latest Ref Rng & Units 04/03/2017 02/16/2017 09/24/2013  Glucose 65 - 99 mg/dL 160(H) 263(H) 143(H)  BUN 6 - 20 mg/dL $Remove'8 11 13  'jkwJjgc$ Creatinine 0.61 - 1.24 mg/dL 0.84 0.92 1.52(H)  Sodium 135 - 145 mmol/L 140 135 138  Potassium 3.5 - 5.1 mmol/L 3.0(L) 3.9 4.0  Chloride 101 - 111 mmol/L 107 101 100  CO2 22 - 32 mmol/L $RemoveB'24 25 29  'fSHsxjQy$ Calcium 8.9 - 10.3 mg/dL 9.3 9.0 9.6  Total Protein 6.5 - 8.1 g/dL 7.3 - 7.1  Total Bilirubin 0.3 - 1.2 mg/dL 1.4(H) - 0.4  Alkaline Phos 38 - 126 U/L 67 - 61  AST 15 - 41 U/L 27 - 38(H)  ALT 17 - 63 U/L 25 - 39   IMAGING:  06/08/21 CTAP w/ contrast IMPRESSION:  Postoperative changes compatible with gastric bypass procedure.  Evaluation of the anastomosis is limited secondary to  decreased surrounding fat and lack of oral contrast material. No  definite evidence for free fluid or free intraperitoneal air.    06/10/21  MRI Abdomen W Wo Contrast MRCP IMPRESSION: 1. No acute findings or explanation for the patient's symptoms. 2. No evidence of pancreatitis, biliary dilatation or choledocholithiasis status post cholecystectomy. 3. Postsurgical changes from previous gastric bypass procedure. 4. Small renal cysts and chronic mild left-sided pelvicaliectasis, stable  PREVIOUS GI EVALUATIONS:   06/09/21 EGD at [place], [place] Evaluate epigastric pain --Upper third of the esophagus, middle third of the esophagus, and lower third of the esophagus were normal.  Gastric pouch intact.  No erosions or ulcerations noted.  Examined jejunum was normal.   Past Medical History:  Diagnosis Date   Angina    Anxiety    Asthma    Crohn disease ([place])    DDD (degenerative disc disease), lumbar    Dr. Sherley Bounds   Depression    Diabetes mellitus    Diabetic neuropathy ([place])    GERD  (gastroesophageal reflux disease)    History of renal calculi    Hyperlipidemia, mild    ldl 129 (1293/425), HDL 48, TG 27 (verified). LDL goal <130, ideally 100   Hypertension    Kidney stones    Migraine headache    Obesity    Pneumonia 04/17/2009   Shortness of breath    Sleep apnea    CPAP     Past Surgical History:  Procedure Laterality Date   CHOLECYSTECTOMY     umbilical hernia repair   GASTRIC BYPASS     KIDNEY STONE SURGERY  2012   KNEE ARTHROSCOPY Right 2010   LUMBAR FUSION  04/30/2010   TONSILLECTOMY     WISDOM TOOTH EXTRACTION  2012   x 4   Family History  Problem Relation Age of Onset   Diabetes Mother  Hypertension Mother    Hyperlipidemia Mother    Heart attack Mother        x2   Cancer Mother    Heart disease Mother        MI x2   Liver cancer Mother    Ovarian cancer Mother    Hyperlipidemia Father    Diabetes Father    Hypertension Father    COPD Father    Heart failure Father    Prostate cancer Father    Ulcerative colitis Father    Hypertension Sister    Anemia Sister    Hyperlipidemia Sister    Thyroid disease Brother    Obesity Brother    Breast cancer Maternal Grandmother    Colon polyps Maternal Grandmother    Diabetes Maternal Grandmother    Diabetes Maternal Grandfather    Breast cancer Paternal Grandmother    Heart attack Paternal Grandmother 79   Heart disease Paternal Grandmother        MI IN 70'S   Pancreatic cancer Paternal Grandmother    Stomach cancer Paternal Grandmother    Diabetes Paternal Grandmother    Colon cancer Paternal Grandfather    Diabetes Paternal Grandfather    Hypertension Maternal Uncle    Kidney disease Maternal Uncle        KIDNEY FAILURE   Diabetes Maternal Uncle    Kidney failure Maternal Uncle    Social History   Tobacco Use   Smoking status: Every Day    Packs/day: 0.50    Years: 7.00    Pack years: 3.50    Types: Cigarettes   Smokeless tobacco: Never  Vaping Use   Vaping Use:  Never used  Substance Use Topics   Alcohol use: Yes    Alcohol/week: 2.0 - 3.0 standard drinks    Types: 2 - 3 Standard drinks or equivalent per week    Comment: 2 per day   Drug use: No   Current Outpatient Medications  Medication Sig Dispense Refill   acetaminophen (TYLENOL) 325 MG tablet Take 650 mg by mouth every 6 (six) hours as needed.     albuterol (VENTOLIN HFA) 108 (90 Base) MCG/ACT inhaler Inhale 2 puffs into the lungs every 6 (six) hours as needed. For shortness of breath.     allopurinol (ZYLOPRIM) 100 MG tablet Take 100 mg by mouth daily.     Ascorbic Acid (VITAMIN C) 500 MG tablet Take 500 mg by mouth daily.       aspirin 81 MG tablet Take 81 mg by mouth daily.     atorvastatin (LIPITOR) 40 MG tablet Take 80 mg by mouth at bedtime.      cetirizine (ZYRTEC) 10 MG tablet Take 10 mg by mouth daily.     cholecalciferol (VITAMIN D) 1000 UNITS tablet Take 1,000 Units by mouth daily.     Cyanocobalamin (VITAMIN B-12 IJ) Inject 1,000 mcg as directed once a week.     ferrous gluconate (FERGON) 324 MG tablet Take 324 mg by mouth daily with breakfast.     FLUoxetine (PROZAC) 40 MG capsule Take 40 mg by mouth daily.     fluticasone (FLONASE) 50 MCG/ACT nasal spray Place 2 sprays into the nose daily.     fluticasone (FLOVENT HFA) 44 MCG/ACT inhaler Inhale 2 puffs into the lungs 2 (two) times daily.     furosemide (LASIX) 40 MG tablet Take 40 mg by mouth daily.     gabapentin (NEURONTIN) 300 MG capsule Take 1,200 mg by mouth 3 (  three) times daily.      ipratropium-albuterol (DUONEB) 0.5-2.5 (3) MG/3ML SOLN Take 3 mLs by nebulization every 6 (six) hours as needed. For shortness of breath.     levothyroxine (SYNTHROID, LEVOTHROID) 25 MCG tablet Take 25 mcg by mouth daily before breakfast.     potassium chloride SA (K-DUR,KLOR-CON) 20 MEQ tablet Take 1 tablet (20 mEq total) daily by mouth. 3 tablet 0   ranitidine (ZANTAC) 150 MG tablet Take 150 mg by mouth 2 (two) times daily.      Riboflavin (VITAMIN B2 PO) Take 500 mg by mouth daily.     sildenafil (VIAGRA) 100 MG tablet Take 100 mg by mouth as needed.     tamsulosin (FLOMAX) 0.4 MG CAPS capsule Take 0.4 mg by mouth daily after supper.     thiamine 100 MG tablet Take by mouth.     traMADol (ULTRAM) 50 MG tablet 50 mg. 2 tablets 4 times a day     TRULICITY 1.5 LK/9.5FM SOPN Inject 1.5 mg into the skin once a week.     umeclidinium-vilanterol (ANORO ELLIPTA) 62.5-25 MCG/ACT AEPB Inhale into the lungs.     No current facility-administered medications for this visit.   Allergies  Allergen Reactions   Ace Inhibitors Swelling    Per patient report Per patient report    Latex Hives   Ibuprofen Rash     Review of Systems: Positive for anxiety, arthritis, back pain, depression, fatigue, headaches, muscle pain and cramps, shortness of breath, sleeping problems, frequent urination.  Urine leakage.  All other systems reviewed and negative except where noted in HPI.    PHYSICAL EXAM :    Wt Readings from Last 3 Encounters:  06/30/21 180 lb (81.6 kg)  04/02/17 240 lb (108.9 kg)  02/16/17 285 lb (129.3 kg)    BP 106/80 (BP Location: Left Arm, Patient Position: Sitting, Cuff Size: Normal)    Pulse 76    Ht $R'5\' 9"'eS$  (1.753 m) Comment: height measured without shoes   Wt 180 lb (81.6 kg)    BMI 26.58 kg/m  Constitutional:  Generally well appearing male in no acute distress. Psychiatric: Pleasant. Normal mood and affect. Behavior is normal. EENT: Pupils normal.  Conjunctivae are normal. No scleral icterus. Neck supple.  Cardiovascular: Normal rate, regular rhythm. No edema Pulmonary/chest: Effort normal and breath sounds normal. No wheezing, rales or rhonchi. Abdominal: Soft, nondistended, nontender. Bowel sounds active throughout. There are no masses palpable. No hepatomegaly. Neurological: Alert and oriented to person place and time. Skin: Skin is warm and dry. No rashes noted.  Tye Savoy, NP  06/30/2021, 11:47  AM  Cc:  Referring Provider Donnie Mesa, P*

## 2021-07-01 LAB — HEPATIC FUNCTION PANEL
ALT: 26 U/L (ref 0–53)
AST: 24 U/L (ref 0–37)
Albumin: 4.6 g/dL (ref 3.5–5.2)
Alkaline Phosphatase: 70 U/L (ref 39–117)
Bilirubin, Direct: 0.3 mg/dL (ref 0.0–0.3)
Total Bilirubin: 1.6 mg/dL — ABNORMAL HIGH (ref 0.2–1.2)
Total Protein: 7.1 g/dL (ref 6.0–8.3)

## 2021-07-01 LAB — LIPASE: Lipase: 38 U/L (ref 11.0–59.0)

## 2021-07-01 NOTE — Progress Notes (Signed)
I agree with the above note, plan 

## 2021-07-10 ENCOUNTER — Other Ambulatory Visit: Payer: Self-pay

## 2021-07-10 DIAGNOSIS — R7989 Other specified abnormal findings of blood chemistry: Secondary | ICD-10-CM

## 2021-07-14 ENCOUNTER — Other Ambulatory Visit: Payer: Medicare Other

## 2021-07-14 DIAGNOSIS — R7989 Other specified abnormal findings of blood chemistry: Secondary | ICD-10-CM

## 2021-07-15 LAB — BILIRUBIN, FRACTIONATED(TOT/DIR/INDIR)
Bilirubin, Direct: 0.4 mg/dL — ABNORMAL HIGH (ref 0.0–0.2)
Indirect Bilirubin: 1.2 mg/dL (calc) (ref 0.2–1.2)
Total Bilirubin: 1.6 mg/dL — ABNORMAL HIGH (ref 0.2–1.2)

## 2021-07-18 ENCOUNTER — Telehealth: Payer: Self-pay | Admitting: Nurse Practitioner

## 2021-07-18 NOTE — Telephone Encounter (Signed)
Inbound call from patient requesting results from labs. Please advise.  

## 2021-07-18 NOTE — Telephone Encounter (Signed)
His fractionated bili is now in Epic. Patient has an active My Chart. ?

## 2021-07-28 ENCOUNTER — Telehealth: Payer: Self-pay

## 2021-07-28 NOTE — Telephone Encounter (Signed)
-----   Message from Meredith Pel, NP sent at 07/25/2021  3:08 PM EST ----- ?Ok, please turn this into a phone note or attach this portion to the phone note that you already have. I just want it to be clear that  our recommendation was to hold off on screening colonoscopy until he sees Dr. Christella Hartigan for the dysphagia. Thanks ?----- Message ----- ?From: Evalee Jefferson, LPN ?Sent: 07/23/2021   4:00 PM EST ?To: Meredith Pel, NP ? ?He is scheduled with Dr Christella Hartigan. He wanted to go ahead with the colon.   ?----- Message ----- ?From: Meredith Pel, NP ?Sent: 07/21/2021  10:07 PM EST ?To: Evalee Jefferson, LPN ? ?Beth, I responded directly to patient about his labs. LFTs are nearly back to normal. I do not know why he has persistent pain in upper abdomen. Dr. Christella Hartigan wants to see him. Can you give him Jacob's first available appointment? Thanks ? ? ? ?

## 2021-08-18 ENCOUNTER — Ambulatory Visit: Payer: Commercial Managed Care - PPO | Admitting: Gastroenterology

## 2021-08-18 ENCOUNTER — Encounter: Payer: Self-pay | Admitting: Gastroenterology

## 2021-08-18 VITALS — BP 120/83 | HR 70 | Temp 97.8°F | Ht 69.0 in | Wt 180.0 lb

## 2021-08-18 DIAGNOSIS — R1010 Upper abdominal pain, unspecified: Secondary | ICD-10-CM

## 2021-08-18 DIAGNOSIS — R194 Change in bowel habit: Secondary | ICD-10-CM

## 2021-08-18 MED ORDER — SODIUM CHLORIDE 0.9 % IV SOLN: 500.0000 mL | Freq: Once | INTRAVENOUS | Status: DC

## 2021-08-18 NOTE — Progress Notes (Signed)
In admitting he explained that he had 15 minutes of angina last week. Epig to left chest, radiating down his left arm, mild associated SOB, while in bathroom.   ? ?I am canceling his elective colonoscopy today. I asked that he get in to see his cardiologist in [place], offered that we refer him locally to cardiology (he declined).  He will contact us [facility]. ? ?I apologized for this hassle.  ?

## 2021-08-18 NOTE — Progress Notes (Signed)
PT here for colonscopy today had recent chest pain a week ago with radiation down the left arm with SOB. He does not use NTG Sl. Dr. Christella Hartigan made aware. Procedure will be canceled today until the patient is cleared by cardiology. ?

## 2021-09-03 ENCOUNTER — Ambulatory Visit: Payer: Commercial Managed Care - PPO | Admitting: Gastroenterology

## 2021-09-18 ENCOUNTER — Telehealth: Payer: Self-pay | Admitting: Gastroenterology

## 2021-09-18 NOTE — Telephone Encounter (Signed)
Patient returned call & said he had spoke with someone here at our office who should be following up on his clearance. He had an appointment with Dr. Rosalita Chessman at [facility] on 5/1. Will make Dr. Ardis Hughs nurse aware when she returns to office.  ?

## 2021-09-18 NOTE — Telephone Encounter (Signed)
Left message for patient to call back  

## 2021-09-18 NOTE — Telephone Encounter (Signed)
Patient called to follow up. Patient would like to know if we have gotten in contact with his cardiologist? Please advise. ?

## 2021-09-22 NOTE — Telephone Encounter (Signed)
Records received by fax.  Dr Ardis Hughs, they are in your Inbox for your review. ?

## 2021-09-22 NOTE — Telephone Encounter (Signed)
Dr Christella Hartigan the records have been put on your desk for review-see below ? ?Monday, Dayton Scrape, RN at 08/18/2021 10:30 AM ? ?Status: Signed  ?PT here for colonscopy today had recent chest pain a week ago with radiation down the left arm with SOB. He does not use NTG Sl. Dr. Christella Hartigan made aware. Procedure will be canceled today until the patient is cleared by cardiology.  ?  ? ?Rachael Fee, MD at 08/18/2021 10:30 AM ? ?Status: Signed  ?In admitting he explained that he had 15 minutes of angina last week. Epig to left chest, radiating down his left arm, mild associated SOB, while in bathroom.   ?  ?I am canceling his elective colonoscopy today. I asked that he get in to see his cardiologist in [place], offered that we refer him locally to cardiology (he declined).  He will contact us after [facility]. ?  ?I apologized for this hassle.  ?  ?  ? ?

## 2021-09-22 NOTE — Telephone Encounter (Signed)
Have either of you seen any records for this pt from [facility] for cardiac clearance? ?

## 2021-09-22 NOTE — Telephone Encounter (Signed)
I have not, but I will be on the look out. ?

## 2021-09-23 NOTE — Telephone Encounter (Signed)
Left message on machine to call back  

## 2021-09-24 NOTE — Telephone Encounter (Signed)
Patient returned your call, please advise. 

## 2021-09-24 NOTE — Telephone Encounter (Signed)
Left message on machine to call back  

## 2021-09-24 NOTE — Telephone Encounter (Signed)
I have been unable to reach the pt by phone. I did send a message to the pt via My Chart.  I did review that the pt views his My Chart messages.   ?

## 2021-10-17 NOTE — Telephone Encounter (Signed)
Dr Jacobs please advise  

## 2021-10-31 NOTE — Telephone Encounter (Signed)
Dr Christella Hartigan when you return can you let me know if you have seen the records from cardiology.

## 2021-10-31 NOTE — Telephone Encounter (Signed)
Nicole have you seen any records for this pt? ?

## 2021-11-19 NOTE — Telephone Encounter (Signed)
Craig Rosales have you seen any records for this pt yet from cardiology?

## 2021-11-25 ENCOUNTER — Telehealth: Payer: Self-pay | Admitting: Gastroenterology

## 2021-11-25 NOTE — Telephone Encounter (Signed)
Stress echo [facility] [place] dated 09/2021.  Fair quality study.  "Negative dobutamine stress echocardiogram.  Ejection fraction 60 to 75%."   Please contact him.  Please let him know that we finally received these stress echo results from his [place] caregivers.  He looks safe to reschedule for colonoscopy in [facility].  Thank you

## 2021-11-26 ENCOUNTER — Other Ambulatory Visit: Payer: Self-pay

## 2021-11-26 DIAGNOSIS — R194 Change in bowel habit: Secondary | ICD-10-CM

## 2021-11-26 DIAGNOSIS — R1010 Upper abdominal pain, unspecified: Secondary | ICD-10-CM

## 2021-11-26 NOTE — Telephone Encounter (Signed)
Left message for patient to return call to further discuss colonoscopy.

## 2021-11-26 NOTE — Progress Notes (Signed)
Amb

## 2021-11-26 NOTE — Telephone Encounter (Signed)
Patient aware that he is scheduled for colonoscopy on 12-05-21 at 3pm with Dr Christella Hartigan.  Patient agreed to plan and verbalized understanding.  No further questions.

## 2021-11-30 ENCOUNTER — Encounter: Payer: Self-pay | Admitting: Certified Registered Nurse Anesthetist

## 2021-12-05 ENCOUNTER — Ambulatory Visit (AMBULATORY_SURGERY_CENTER): Payer: Commercial Managed Care - PPO | Admitting: Gastroenterology

## 2021-12-05 ENCOUNTER — Encounter: Payer: Self-pay | Admitting: Gastroenterology

## 2021-12-05 VITALS — BP 111/77 | HR 59 | Temp 98.9°F | Resp 14 | Ht 69.0 in | Wt 180.0 lb

## 2021-12-05 DIAGNOSIS — R1084 Generalized abdominal pain: Secondary | ICD-10-CM

## 2021-12-05 DIAGNOSIS — R194 Change in bowel habit: Secondary | ICD-10-CM

## 2021-12-05 DIAGNOSIS — R7989 Other specified abnormal findings of blood chemistry: Secondary | ICD-10-CM

## 2021-12-05 MED ORDER — SODIUM CHLORIDE 0.9 % IV SOLN: 500.0000 mL | INTRAVENOUS | Status: DC

## 2021-12-05 NOTE — Op Note (Signed)
[facility] Patient Name: Craig Rosales Procedure Date: 12/05/2021 2:01 PM MRN: [id number] Endoscopist: Rachael Fee , MD Age: 41 Referring MD:  Date of Birth: 11-Nov-1980 Gender: Male Account #: [id number] Procedure:                Colonoscopy Indications:              Generalized abdominal pain, Change in bowel habits Medicines:                Monitored Anesthesia Care Procedure:                Pre-Anesthesia Assessment:                           - Prior to the procedure, a History and Physical                            was performed, and patient medications and                            allergies were reviewed. The patient's tolerance of                            previous anesthesia was also reviewed. The risks                            and benefits of the procedure and the sedation                            options and risks were discussed with the patient.                            All questions were answered, and informed consent                            was obtained. Prior Anticoagulants: The patient has                            taken no previous anticoagulant or antiplatelet                            agents. ASA Grade Assessment: II - A patient with                            mild systemic disease. After reviewing the risks                            and benefits, the patient was deemed in                            satisfactory condition to undergo the procedure.                           After obtaining informed consent, the colonoscope  was passed under direct vision. Throughout the                            procedure, the patient's blood pressure, pulse, and                            oxygen saturations were monitored continuously. The                            Olympus CF-HQ190L [id number]) Colonoscope was                            introduced through the anus and advanced to the the                            cecum,  identified by appendiceal orifice and                            ileocecal valve. The colonoscopy was performed                            without difficulty. The patient tolerated the                            procedure well. The quality of the bowel                            preparation was good. The ileocecal valve,                            appendiceal orifice, and rectum were photographed. Scope In: 2:03:31 PM Scope Out: 2:18:38 PM Scope Withdrawal Time: 0 hours 7 minutes 30 seconds  Total Procedure Duration: 0 hours 15 minutes 7 seconds  Findings:                 The entire examined colon appeared normal on direct                            and retroflexion views. Complications:            No immediate complications. Estimated blood loss:                            None. Estimated Blood Loss:     Estimated blood loss: none. Impression:               - The entire examined colon is normal on direct and                            retroflexion views.                           - No polyps or cancers.                           - Symptoms are most likely due to  Trulicity which                            is very well known to cause a wide variety of GI                            symptoms. Recommendation:           - Patient has a contact number available for                            emergencies. The signs and symptoms of potential                            delayed complications were discussed with the                            patient. Return to normal activities tomorrow.                            Written discharge instructions were provided to the                            patient.                           - Resume previous diet.                           - Continue present medications.                           - Repeat colonoscopy in 10 years for screening. Rachael Fee, MD 12/05/2021 2:21:12 PM This report has been signed electronically.

## 2021-12-05 NOTE — Patient Instructions (Signed)

## 2021-12-05 NOTE — Progress Notes (Signed)
HPI: This is a man with change in bowel habits, minor rectal bleeding   ROS: complete GI ROS as described in HPI, all other review negative.  Constitutional:  No unintentional weight loss   Past Medical History:  Diagnosis Date   Angina    Anxiety    Asthma    Crohn disease (HCC)    DDD (degenerative disc disease), lumbar    Dr. Marikay Alar   Depression    Diabetes mellitus    Diabetic neuropathy (HCC)    GERD (gastroesophageal reflux disease)    History of renal calculi    Hyperlipidemia, mild    ldl 129 (1293/425), HDL 48, TG 27 (verified). LDL goal <130, ideally 100   Hypertension    Kidney stones    Migraine headache    Obesity    Pneumonia 04/17/2009   Shortness of breath    Sleep apnea    CPAP    Past Surgical History:  Procedure Laterality Date   CHOLECYSTECTOMY     umbilical hernia repair   GASTRIC BYPASS     KIDNEY STONE SURGERY  2012   KNEE ARTHROSCOPY Right 2010   LUMBAR FUSION  04/30/2010   TONSILLECTOMY     WISDOM TOOTH EXTRACTION  2012   x 4    Current Outpatient Medications  Medication Sig Dispense Refill   acetaminophen (TYLENOL) 325 MG tablet Take 650 mg by mouth every 6 (six) hours as needed.     albuterol (VENTOLIN HFA) 108 (90 Base) MCG/ACT inhaler Inhale 2 puffs into the lungs every 6 (six) hours as needed. For shortness of breath.     allopurinol (ZYLOPRIM) 100 MG tablet Take 100 mg by mouth daily.     Ascorbic Acid (VITAMIN C) 500 MG tablet Take 500 mg by mouth daily.       aspirin 81 MG tablet Take 81 mg by mouth daily.     atorvastatin (LIPITOR) 40 MG tablet Take 80 mg by mouth at bedtime.      cetirizine (ZYRTEC) 10 MG tablet Take 10 mg by mouth daily.     cholecalciferol (VITAMIN D) 1000 UNITS tablet Take 1,000 Units by mouth daily.     Cyanocobalamin (VITAMIN B-12 IJ) Inject 1,000 mcg as directed once a week.     ferrous gluconate (FERGON) 324 MG tablet Take 324 mg by mouth daily with breakfast.     FLUoxetine (PROZAC) 40 MG  capsule Take 40 mg by mouth daily.     fluticasone (FLONASE) 50 MCG/ACT nasal spray Place 2 sprays into the nose daily.     fluticasone (FLOVENT HFA) 44 MCG/ACT inhaler Inhale 2 puffs into the lungs 2 (two) times daily.     furosemide (LASIX) 40 MG tablet Take 40 mg by mouth daily.     gabapentin (NEURONTIN) 300 MG capsule Take 1,200 mg by mouth 3 (three) times daily.      ipratropium-albuterol (DUONEB) 0.5-2.5 (3) MG/3ML SOLN Take 3 mLs by nebulization every 6 (six) hours as needed. For shortness of breath.     levothyroxine (SYNTHROID, LEVOTHROID) 25 MCG tablet Take 25 mcg by mouth daily before breakfast.     potassium chloride SA (K-DUR,KLOR-CON) 20 MEQ tablet Take 1 tablet (20 mEq total) daily by mouth. 3 tablet 0   ranitidine (ZANTAC) 150 MG tablet Take 150 mg by mouth 2 (two) times daily.     Riboflavin (VITAMIN B2 PO) Take 500 mg by mouth daily.     sildenafil (VIAGRA) 100 MG tablet Take 100  mg by mouth as needed.     tamsulosin (FLOMAX) 0.4 MG CAPS capsule Take 0.4 mg by mouth daily after supper.     thiamine 100 MG tablet Take by mouth.     traMADol (ULTRAM) 50 MG tablet 50 mg. 2 tablets 4 times a day     TRULICITY 1.5 MG/0.5ML SOPN Inject 1.5 mg into the skin once a week.     umeclidinium-vilanterol (ANORO ELLIPTA) 62.5-25 MCG/ACT AEPB Inhale into the lungs.     Current Facility-Administered Medications  Medication Dose Route Frequency Provider Last Rate Last Admin   0.9 %  sodium chloride infusion  500 mL Intravenous Once Rachael Fee, MD        Allergies as of 12/05/2021 - Review Complete 11/30/2021  Allergen Reaction Noted   Ace inhibitors Swelling 03/25/2016   Latex Hives 10/01/2010   Ibuprofen Rash 02/16/2017    Family History  Problem Relation Age of Onset   Diabetes Mother    Hypertension Mother    Hyperlipidemia Mother    Heart attack Mother        x2   Cancer Mother    Heart disease Mother        MI x2   Liver cancer Mother    Ovarian cancer Mother     Hyperlipidemia Father    Diabetes Father    Hypertension Father    COPD Father    Heart failure Father    Prostate cancer Father    Ulcerative colitis Father    Hypertension Sister    Anemia Sister    Hyperlipidemia Sister    Thyroid disease Brother    Obesity Brother    Breast cancer Maternal Grandmother    Colon polyps Maternal Grandmother    Diabetes Maternal Grandmother    Diabetes Maternal Grandfather    Breast cancer Paternal Grandmother    Heart attack Paternal Grandmother 33   Heart disease Paternal Grandmother        MI IN 70'S   Pancreatic cancer Paternal Grandmother    Stomach cancer Paternal Grandmother    Diabetes Paternal Grandmother    Colon cancer Paternal Grandfather    Diabetes Paternal Grandfather    Hypertension Maternal Uncle    Kidney disease Maternal Uncle        KIDNEY FAILURE   Diabetes Maternal Uncle    Kidney failure Maternal Uncle     Social History   Socioeconomic History   Marital status: Married    Spouse name: Not on file   Number of children: 5   Years of education: Not on file   Highest education level: Not on file  Occupational History   Occupation: CNA    Employer: [employer]    Comment: unemployed   Occupation: disabled  Tobacco Use   Smoking status: Every Day    Packs/day: 0.50    Years: 7.00    Total pack years: 3.50    Types: Cigarettes   Smokeless tobacco: Never  Vaping Use   Vaping Use: Never used  Substance and Sexual Activity   Alcohol use: Yes    Alcohol/week: 2.0 - 3.0 standard drinks of alcohol    Types: 2 - 3 Standard drinks or equivalent per week    Comment: 2 per day   Drug use: No   Sexual activity: Yes  Other Topics Concern   Not on file  Social History Narrative   SINGLE   REG EXERCISE---NO   Social Determinants of Health   Financial  Resource Strain: Not on file  Food Insecurity: Not on file  Transportation Needs: Not on file  Physical Activity: Not on file  Stress: Not on file  Social  Connections: Not on file  Intimate Partner Violence: Not on file     Physical Exam:  Constitutional: generally well-appearing Psychiatric: alert and oriented x3 Lungs: CTA bilaterally Heart: no MCR  Assessment and plan: 41 y.o. male with change in bowel habits, minor rectal bleeding  Colonoscopy today  Care is appropriate for the ambulatory setting.  Rob Bunting, MD [facility] 12/05/2021, 1:05 PM

## 2021-12-05 NOTE — Progress Notes (Signed)
Report given to PACU, vss 

## 2021-12-08 ENCOUNTER — Telehealth: Payer: Self-pay

## 2021-12-08 NOTE — Telephone Encounter (Signed)
  Follow up Call-     12/05/2021    1:30 PM 08/18/2021    9:49 AM  Call back number  Post procedure Call Back phone  # [phone]  Permission to leave phone message Yes Yes     Left message

## 2022-03-26 ENCOUNTER — Ambulatory Visit (INDEPENDENT_AMBULATORY_CARE_PROVIDER_SITE_OTHER): Payer: Commercial Managed Care - PPO | Admitting: Physician Assistant

## 2022-03-26 ENCOUNTER — Encounter: Payer: Self-pay | Admitting: Physician Assistant

## 2022-03-26 VITALS — BP 116/80 | HR 72 | Ht 69.0 in | Wt 183.4 lb

## 2022-03-26 DIAGNOSIS — R1013 Epigastric pain: Secondary | ICD-10-CM | POA: Diagnosis not present

## 2022-03-26 DIAGNOSIS — R110 Nausea: Secondary | ICD-10-CM | POA: Diagnosis not present

## 2022-03-26 MED ORDER — AMBULATORY NON FORMULARY MEDICATION: 1 refills | Status: AC

## 2022-03-26 MED ORDER — OMEPRAZOLE 40 MG PO CPDR: 40.0000 mg | DELAYED_RELEASE_CAPSULE | Freq: Two times a day (BID) | ORAL | 5 refills | Status: AC

## 2022-03-26 MED ORDER — FAMOTIDINE 40 MG PO TABS: 40.0000 mg | ORAL_TABLET | Freq: Two times a day (BID) | ORAL | 5 refills | Status: AC

## 2022-03-26 NOTE — Progress Notes (Signed)
Chief Complaint: Follow-up after colonoscopy  HPI:    Craig Rosales is a 41 year old African-American male with a past medical history as listed below including gastric bypass and GERD and multiple others, previously known to Dr. Christella Hartigan, who was referred to me by Vanessa [place], FNP for follow-up after colonoscopy.    06/30/2021 patient seen in clinic by Willette Cluster for hospital follow-up.  Apparently been hospitalized the end of January with upper abdominal pain, elevated LFTs and lipase elevation, CTAP suggested mild wall thickening, no pancreatitis.  Inpatient EGD was normal.  LFTs trended down.  Inpatient MRCP was negative.  Etiology of pain is unclear.  Status post remote cholecystectomy (gallstones).  Thought possibly could have passed a stone but also wondered if Trulicity was contributing.  Recommended colonoscopy at that time due to bowel habit changes and constipation.    12/05/2021 colonoscopy with normal colon.  It was thought symptoms were likely due to Trulicity which is very well known to cause a wide array of GI symptoms.  Repeat recommended 10 years.    02/27/2022 patient seen in the ED in [place] for an acute GI upper GI bleed and "colitis", given Augmentin.  CBC with a white count elevated 12.9.  CMP with a minimally elevated total bili 1.7.  Lipase elevated at 191.  At that time was recently on a course of Prednisone for URI.  Described melena.  CTAP showed equivocal ascending colonic wall thickening versus nondistention, gastric bypass without complication and a question of a punctate nonobstructing stone in the left kidney.  Also given Prilosec 20 mg daily.    Today, patient presents to clinic and tells me that he has continued with epigastric pain.  He cannot even eat without having nausea.  Tells me this has really been going on since January of this year but seems to have worsened lately.  He also saw some black stools which prompted his visit to the ED as above.  He has seen  no further black stools but continues with severe 10 /10 abdominal pain anytime that he eats.  On October 25 he stopped eating meat and went to a more vegetarian/plant-based diet but even this bothers his stomach.  Drinking makes things worse.  Along with this 10/10 sharp pain he gets after eating anything within 10 to 15 minutes he will get nauseous.  He has never actually vomited.  Sometimes this pain can last an hour and other times it is just a couple of minutes.  He is down at least 7 to 10 pounds over the past month.  He is currently on Omeprazole 20 mg daily with no real relief as well as taking Carafate 4 times daily.  Tells me that he also did a trial of stopping Trulicity for 3 weeks which did not change his pain at all.    Denies fever, chills, vomiting or symptoms that awaken him from sleep.  Past Medical History:  Diagnosis Date   Angina    Anxiety    Asthma    Crohn disease (HCC)    DDD (degenerative disc disease), lumbar    Dr. Marikay Alar   Depression    Diabetes mellitus    Diabetic neuropathy (HCC)    GERD (gastroesophageal reflux disease)    History of renal calculi    Hyperlipidemia, mild    ldl 129 (1293/425), HDL 48, TG 27 (verified). LDL goal <130, ideally 100   Hypertension    Kidney stones    Migraine headache  Obesity    Pneumonia 04/17/2009   Shortness of breath    Sleep apnea    CPAP    Past Surgical History:  Procedure Laterality Date   CHOLECYSTECTOMY     umbilical hernia repair   GASTRIC BYPASS     KIDNEY STONE SURGERY  2012   KNEE ARTHROSCOPY Right 2010   LUMBAR FUSION  04/30/2010   TONSILLECTOMY     WISDOM TOOTH EXTRACTION  2012   x 4    Current Outpatient Medications  Medication Sig Dispense Refill   acetaminophen (TYLENOL) 325 MG tablet Take 650 mg by mouth every 6 (six) hours as needed.     albuterol (VENTOLIN HFA) 108 (90 Base) MCG/ACT inhaler Inhale 2 puffs into the lungs every 6 (six) hours as needed. For shortness of breath.      allopurinol (ZYLOPRIM) 100 MG tablet Take 100 mg by mouth daily.     Ascorbic Acid (VITAMIN C) 500 MG tablet Take 500 mg by mouth daily.       aspirin 81 MG tablet Take 81 mg by mouth daily.     atorvastatin (LIPITOR) 40 MG tablet Take 80 mg by mouth at bedtime.      cetirizine (ZYRTEC) 10 MG tablet Take 10 mg by mouth daily.     cholecalciferol (VITAMIN D) 1000 UNITS tablet Take 1,000 Units by mouth daily.     Cyanocobalamin (VITAMIN B-12 IJ) Inject 1,000 mcg as directed once a week.     ferrous gluconate (FERGON) 324 MG tablet Take 324 mg by mouth daily with breakfast.     FLUoxetine (PROZAC) 40 MG capsule Take 40 mg by mouth daily.     fluticasone (FLONASE) 50 MCG/ACT nasal spray Place 2 sprays into the nose daily.     fluticasone (FLOVENT HFA) 44 MCG/ACT inhaler Inhale 2 puffs into the lungs 2 (two) times daily.     furosemide (LASIX) 40 MG tablet Take 40 mg by mouth daily.     gabapentin (NEURONTIN) 300 MG capsule Take 1,200 mg by mouth 3 (three) times daily.      ipratropium-albuterol (DUONEB) 0.5-2.5 (3) MG/3ML SOLN Take 3 mLs by nebulization every 6 (six) hours as needed. For shortness of breath.     levothyroxine (SYNTHROID, LEVOTHROID) 25 MCG tablet Take 25 mcg by mouth daily before breakfast.     potassium chloride SA (K-DUR,KLOR-CON) 20 MEQ tablet Take 1 tablet (20 mEq total) daily by mouth. 3 tablet 0   ranitidine (ZANTAC) 150 MG tablet Take 150 mg by mouth 2 (two) times daily.     Riboflavin (VITAMIN B2 PO) Take 500 mg by mouth daily.     sildenafil (VIAGRA) 100 MG tablet Take 100 mg by mouth as needed.     tamsulosin (FLOMAX) 0.4 MG CAPS capsule Take 0.4 mg by mouth daily after supper.     thiamine 100 MG tablet Take by mouth.     traMADol (ULTRAM) 50 MG tablet 50 mg. 2 tablets 4 times a day     TRULICITY 1.5 MG/0.5ML SOPN Inject 1.5 mg into the skin once a week.     umeclidinium-vilanterol (ANORO ELLIPTA) 62.5-25 MCG/ACT AEPB Inhale into the lungs.     Current  Facility-Administered Medications  Medication Dose Route Frequency Provider Last Rate Last Admin   0.9 %  sodium chloride infusion  500 mL Intravenous Continuous Rachael Fee, MD        Allergies as of 03/26/2022 - Review Complete 03/26/2022  Allergen Reaction Noted  Ace inhibitors Swelling 03/25/2016   Latex Hives 10/01/2010   Ibuprofen Rash 02/16/2017    Family History  Problem Relation Age of Onset   Diabetes Mother    Hypertension Mother    Hyperlipidemia Mother    Heart attack Mother        x2   Cancer Mother    Heart disease Mother        MI x2   Liver cancer Mother    Ovarian cancer Mother    Hyperlipidemia Father    Diabetes Father    Hypertension Father    COPD Father    Heart failure Father    Prostate cancer Father    Ulcerative colitis Father    Hypertension Sister    Anemia Sister    Hyperlipidemia Sister    Thyroid disease Brother    Obesity Brother    Breast cancer Maternal Grandmother    Colon polyps Maternal Grandmother    Diabetes Maternal Grandmother    Diabetes Maternal Grandfather    Breast cancer Paternal Grandmother    Heart attack Paternal Grandmother [id number]   Heart disease Paternal Grandmother        MI IN 70'S   Pancreatic cancer Paternal Grandmother    Stomach cancer Paternal Grandmother    Diabetes Paternal Grandmother    Colon cancer Paternal Grandfather    Diabetes Paternal Grandfather    Hypertension Maternal Uncle    Kidney disease Maternal Uncle        KIDNEY FAILURE   Diabetes Maternal Uncle    Kidney failure Maternal Uncle     Social History   Socioeconomic History   Marital status: Married    Spouse name: Not on file   Number of children: 5   Years of education: Not on file   Highest education level: Not on file  Occupational History   Occupation: CNA    Employer: [employer]    Comment: unemployed   Occupation: disabled  Tobacco Use   Smoking status: Every Day    Packs/day: 0.50    Years: 7.00    Total  pack years: 3.50    Types: Cigarettes   Smokeless tobacco: Current  Vaping Use   Vaping Use: Never used  Substance and Sexual Activity   Alcohol use: Yes    Alcohol/week: 2.0 - 3.0 standard drinks of alcohol    Types: 2 - 3 Standard drinks or equivalent per week    Comment: 2 per day   Drug use: No   Sexual activity: Yes  Other Topics Concern   Not on file  Social History Narrative   SINGLE   REG EXERCISE---NO   Social Determinants of Health   Financial Resource Strain: Not on file  Food Insecurity: Not on file  Transportation Needs: Not on file  Physical Activity: Not on file  Stress: Not on file  Social Connections: Not on file  Intimate Partner Violence: Not on file    Review of Systems:    Constitutional: No fever or chills Cardiovascular: No chest pain Respiratory: No SOB  Gastrointestinal: See HPI and otherwise negative   Physical Exam:  Vital signs: BP 116/80   Pulse 72   Ht 5\' 9"  (1.753 m)   Wt 183 lb 6.4 oz (83.2 kg)   SpO2 98%   BMI 27.08 kg/m    Constitutional:   Pleasant thin appearing AA male appears to be in NAD, Well developed, Well nourished, alert and cooperative Respiratory: Respirations even and unlabored. Lungs clear  to auscultation bilaterally.   No wheezes, crackles, or rhonchi.  Cardiovascular: Normal S1, S2. No MRG. Regular rate and rhythm. No peripheral edema, cyanosis or pallor.  Gastrointestinal:  Soft, nondistended, marled epigastric ttp with involuntary guarding Normal bowel sounds. No appreciable masses or hepatomegaly. Rectal:  Not performed.  Psychiatric: Demonstrates good judgement and reason without abnormal affect or behaviors.  RELEVANT LABS AND IMAGING: CBC    Component Value Date/Time   WBC 5.8 04/03/2017 0040   RBC 4.97 04/03/2017 0040   HGB 13.6 04/03/2017 0040   HGB 14.4 06/07/2013 1443   HCT 42.5 04/03/2017 0040   HCT 44.4 06/07/2013 1443   PLT 156 04/03/2017 0040   PLT 175 06/07/2013 1443   MCV 85.5 04/03/2017  0040   MCV 85 06/07/2013 1443   MCH 27.4 04/03/2017 0040   MCHC 32.0 04/03/2017 0040   RDW 13.9 04/03/2017 0040   RDW 14.5 06/07/2013 1443   LYMPHSABS 1.8 02/16/2017 0545   LYMPHSABS 2.3 01/06/2012 0315   MONOABS 0.4 02/16/2017 0545   MONOABS 0.7 01/06/2012 0315   EOSABS 0.0 02/16/2017 0545   EOSABS 0.0 01/06/2012 0315   BASOSABS 0.0 02/16/2017 0545   BASOSABS 0.0 01/06/2012 0315    CMP     Component Value Date/Time   NA 140 04/03/2017 0040   NA 134 (L) 06/08/2013 0408   K 3.0 (L) 04/03/2017 0040   K 3.7 06/08/2013 0408   CL 107 04/03/2017 0040   CL 99 06/08/2013 0408   CO2 24 04/03/2017 0040   CO2 29 06/08/2013 0408   GLUCOSE 160 (H) 04/03/2017 0040   GLUCOSE 373 (H) 06/08/2013 0408   BUN 8 04/03/2017 0040   BUN 7 06/08/2013 0408   CREATININE 0.84 04/03/2017 0040   CREATININE 1.04 06/08/2013 0408   CALCIUM 9.3 04/03/2017 0040   CALCIUM 9.1 06/08/2013 0408   PROT 7.1 06/30/2021 1239   PROT 8.3 (H) 06/07/2013 1443   ALBUMIN 4.6 06/30/2021 1239   ALBUMIN 4.3 06/07/2013 1443   AST 24 06/30/2021 1239   AST 22 06/07/2013 1443   ALT 26 06/30/2021 1239   ALT 33 06/07/2013 1443   ALKPHOS 70 06/30/2021 1239   ALKPHOS 75 06/07/2013 1443   BILITOT 1.6 (H) 07/14/2021 0829   BILITOT 0.7 06/07/2013 1443   GFRNONAA >60 04/03/2017 0040   GFRNONAA >60 06/08/2013 0408   GFRAA >60 04/03/2017 0040   GFRAA >60 06/08/2013 0408    Assessment: 1.  Epigastric pain: Occurs 10 to 15 minutes after eating and can last for up to an hour 10/10 pain, no help from Carafate 4 times daily and Omeprazole 20 mg daily as well as Pepcid 40 twice daily, EGD in January with no etiology but symptoms have worsened recently, previous gastric bypass, did trial holding Trulicity for 3 weeks which made no difference; consider gastritis +/- H. pylori +/- functional dyspepsia versus other 2.  Nausea: With above 3.  Melena: Consider GI bleed, no further melena over the past month  Plan: 1.  Scheduled  patient for an urgent EGD given ongoing epigastric pain which has worsened lately with description of melena.  This was scheduled with Dr. Leonides Schanz in the [facility] given Dr. Christella Hartigan absence.  Did provide the patient a detailed list of risks for the procedure and he agrees to proceed. Patient is appropriate for endoscopic procedure(s) in the ambulatory ([facility]) setting.  2.  Increased Omeprazole to 40 mg twice daily, 30-60 minutes before breakfast and dinner.  #60 with 5 refills.  3.  Refilled Pepcid 40 mg every morning and nightly #60 with 5 refills. 4.  Prescribed GI cocktail 5-10 mL every 4-6 hours as needed for abdominal pain. 5.  Told patient to discontinue Carafate so as to not block absorption of other meds. 6.  Patient will follow in clinic per recommendations after time of EGD.  Note sent to Dr. Leonides Schanz in lieu of Dr. Christella Hartigan absence.  Hyacinth Meeker, PA-C [facility] 03/26/2022, 11:01 AM  Cc: Vanessa , FNP

## 2022-03-26 NOTE — Patient Instructions (Addendum)
_______________________________________________________  If you are age 41 or older, your body mass index should be between 23-30. Your Body mass index is 27.08 kg/m. If this is out of the aforementioned range listed, please consider follow up with your Primary Care Provider.  If you are age 55 or younger, your body mass index should be between 19-25. Your Body mass index is 27.08 kg/m. If this is out of the aformentioned range listed, please consider follow up with your Primary Care Provider.   ________________________________________________________  Craig Rosales have been scheduled for an endoscopy. Please follow written instructions given to you at your visit today. If you use inhalers (even only as needed), please bring them with you on the day of your procedure.   The [facility] providers would like to encourage you to use [facility] to communicate with providers for non-urgent requests or questions.  Due to long hold times on the telephone, sending your provider a message by [facility] may be a faster and more efficient way to get a response.  Please allow 48 business hours for a response.  Please remember that this is for non-urgent requests.  _______________________________________________________  It was a pleasure to see you today!  Thank you for trusting me with your gastrointestinal care!

## 2022-03-26 NOTE — Progress Notes (Signed)
I agree with the assessment and plan as outlined by Ms. Lemmon. 

## 2022-04-02 ENCOUNTER — Ambulatory Visit (AMBULATORY_SURGERY_CENTER): Payer: Commercial Managed Care - PPO | Admitting: Internal Medicine

## 2022-04-02 ENCOUNTER — Encounter: Payer: Self-pay | Admitting: Internal Medicine

## 2022-04-02 VITALS — BP 107/71 | HR 66 | Temp 98.0°F | Resp 15 | Ht 69.0 in | Wt 183.0 lb

## 2022-04-02 DIAGNOSIS — K319 Disease of stomach and duodenum, unspecified: Secondary | ICD-10-CM

## 2022-04-02 DIAGNOSIS — K219 Gastro-esophageal reflux disease without esophagitis: Secondary | ICD-10-CM

## 2022-04-02 DIAGNOSIS — R1013 Epigastric pain: Secondary | ICD-10-CM

## 2022-04-02 MED ORDER — SODIUM CHLORIDE 0.9 % IV SOLN: 500.0000 mL | INTRAVENOUS | Status: DC

## 2022-04-02 MED ORDER — DEXTROSE 5 % IV SOLN: INTRAVENOUS | Status: DC

## 2022-04-02 MED ORDER — DEXTROSE 10 % IV SOLN: INTRAVENOUS | Status: AC

## 2022-04-02 NOTE — Progress Notes (Signed)
GASTROENTEROLOGY PROCEDURE H&P NOTE   Primary Care Physician: Vanessa , FNP    Reason for Procedure:   Epigastric ab pain, melena, history of gastric bypass  Plan:    EGD  Patient is appropriate for endoscopic procedure(s) in the ambulatory ([facility]) setting.  The nature of the procedure, as well as the risks, benefits, and alternatives were carefully and thoroughly reviewed with the patient. Ample time for discussion and questions allowed. The patient understood, was satisfied, and agreed to proceed.     HPI: Craig Rosales is a 41 y.o. male who presents for EGD for evaluation of epigastric ab pain, history of gastric bypass, and melena .  Patient was most recently seen in the Gastroenterology Clinic on 03/26/22.  No interval change in medical history since that appointment. Please refer to that note for full details regarding GI history and clinical presentation.   Past Medical History:  Diagnosis Date   Angina    Anxiety    Asthma    Crohn disease (HCC)    DDD (degenerative disc disease), lumbar    Dr. Marikay Alar   Depression    Diabetes mellitus    Diabetic neuropathy (HCC)    GERD (gastroesophageal reflux disease)    History of renal calculi    Hyperlipidemia, mild    ldl 129 (1293/425), HDL 48, TG 27 (verified). LDL goal <130, ideally 100   Hypertension    Kidney stones    Migraine headache    Obesity    Pneumonia 04/17/2009   Shortness of breath    Sleep apnea    CPAP    Past Surgical History:  Procedure Laterality Date   CHOLECYSTECTOMY     umbilical hernia repair   GASTRIC BYPASS     KIDNEY STONE SURGERY  2012   KNEE ARTHROSCOPY Right 2010   LUMBAR FUSION  04/30/2010   TONSILLECTOMY     WISDOM TOOTH EXTRACTION  2012   x 4    Prior to Admission medications   Medication Sig Start Date End Date Taking? Authorizing Provider  acetaminophen (TYLENOL) 325 MG tablet Take 650 mg by mouth every 6 (six) hours as needed.   Yes [provider]  albuterol (VENTOLIN HFA) 108 (90 Base) MCG/ACT inhaler Inhale 2 puffs into the lungs every 6 (six) hours as needed. For shortness of breath.   Yes [provider]  allopurinol (ZYLOPRIM) 100 MG tablet Take 100 mg by mouth daily.   Yes [provider]  AMBULATORY NON FORMULARY MEDICATION 72ml viscoux lidocaine 44ml 10/65ml dicylomine  Maalox  Total of 03/26/22  Yes Lemmon, Violet Baldy, PA  Ascorbic Acid (VITAMIN C) 500 MG tablet Take 500 mg by mouth daily.     Yes [provider]  aspirin 81 MG tablet Take 81 mg by mouth daily.   Yes [provider]  atorvastatin (LIPITOR) 40 MG tablet Take 80 mg by mouth at bedtime.    Yes [provider]  cetirizine (ZYRTEC) 10 MG tablet Take 10 mg by mouth daily.   Yes [provider]  cholecalciferol (VITAMIN D) 1000 UNITS tablet Take 1,000 Units by mouth daily.   Yes [provider]  famotidine (PEPCID) 40 MG tablet Take 1 tablet (40 mg total) by mouth 2 (two) times daily. 03/26/22  Yes Unk Lightning, PA  ferrous gluconate (FERGON) 324 MG tablet Take 324 mg by mouth daily with breakfast.   Yes [provider]  FLUoxetine (PROZAC) 40 MG  capsule Take 40 mg by mouth daily.   Yes [provider]  fluticasone (FLONASE) 50 MCG/ACT nasal spray Place 2 sprays into the nose daily.   Yes [provider]  fluticasone (FLOVENT HFA) 44 MCG/ACT inhaler Inhale 2 puffs into the lungs 2 (two) times daily.   Yes [provider]  furosemide (LASIX) 40 MG tablet Take 40 mg by mouth daily.   Yes [provider]  gabapentin (NEURONTIN) 300 MG capsule Take 1,200 mg by mouth 3 (three) times daily.    Yes [provider]  levothyroxine (SYNTHROID, LEVOTHROID) 25 MCG tablet Take 25 mcg by mouth daily before breakfast.   Yes [provider]  potassium chloride SA (K-DUR,KLOR-CON) 20 MEQ tablet Take 1 tablet (20 mEq total)  daily by mouth. 04/03/17  Yes Zadie Rhine, MD  ranitidine (ZANTAC) 150 MG tablet Take 150 mg by mouth 2 (two) times daily.   Yes [provider]  Riboflavin (VITAMIN B2 PO) Take 500 mg by mouth daily.   Yes [provider]  tamsulosin (FLOMAX) 0.4 MG CAPS capsule Take 0.4 mg by mouth daily after supper.   Yes [provider]  thiamine 100 MG tablet Take by mouth. 03/15/17  Yes [provider]  traMADol (ULTRAM) 50 MG tablet 50 mg. 2 tablets 4 times a day   Yes [provider]  umeclidinium-vilanterol (ANORO ELLIPTA) 62.5-25 MCG/ACT AEPB Inhale into the lungs.   Yes [provider]  Cyanocobalamin (VITAMIN B-12 IJ) Inject 1,000 mcg as directed once a week.    [provider]  ipratropium-albuterol (DUONEB) 0.5-2.5 (3) MG/3ML SOLN Take 3 mLs by nebulization every 6 (six) hours as needed. For shortness of breath.    [provider]  omeprazole (PRILOSEC) 40 MG capsule Take 1 capsule (40 mg total) by mouth 2 (two) times daily. Patient not taking: Reported on 04/02/2022 03/26/22   Unk Lightning, PA  sildenafil (VIAGRA) 100 MG tablet Take 100 mg by mouth as needed.    [provider]  TRULICITY 1.5 MG/0.5ML SOPN Inject 1.5 mg into the skin once a week. 05/01/21   [provider]    Current Outpatient Medications  Medication Sig Dispense Refill   acetaminophen (TYLENOL) 325 MG tablet Take 650 mg by mouth every 6 (six) hours as needed.     albuterol (VENTOLIN HFA) 108 (90 Base) MCG/ACT inhaler Inhale 2 puffs into the lungs every 6 (six) hours as needed. For shortness of breath.     allopurinol (ZYLOPRIM) 100 MG tablet Take 100 mg by mouth daily.     AMBULATORY NON FORMULARY MEDICATION 69ml viscoux lidocaine 31ml 10/49ml dicylomine  Maalox  Total of 270 mL 1   Ascorbic Acid (VITAMIN C) 500 MG tablet Take 500 mg by mouth daily.       aspirin 81 MG tablet Take 81 mg by mouth daily.      atorvastatin (LIPITOR) 40 MG tablet Take 80 mg by mouth at bedtime.      cetirizine (ZYRTEC) 10 MG tablet Take 10 mg by mouth daily.     cholecalciferol (VITAMIN D) 1000 UNITS tablet Take 1,000 Units by mouth daily.     famotidine (PEPCID) 40 MG tablet Take 1 tablet (40 mg total) by mouth 2 (two) times daily. 60 tablet 5   ferrous gluconate (FERGON) 324 MG tablet Take 324 mg by mouth daily with breakfast.     FLUoxetine (PROZAC) 40 MG capsule Take 40 mg by mouth daily.  fluticasone (FLONASE) 50 MCG/ACT nasal spray Place 2 sprays into the nose daily.     fluticasone (FLOVENT HFA) 44 MCG/ACT inhaler Inhale 2 puffs into the lungs 2 (two) times daily.     furosemide (LASIX) 40 MG tablet Take 40 mg by mouth daily.     gabapentin (NEURONTIN) 300 MG capsule Take 1,200 mg by mouth 3 (three) times daily.      levothyroxine (SYNTHROID, LEVOTHROID) 25 MCG tablet Take 25 mcg by mouth daily before breakfast.     potassium chloride SA (K-DUR,KLOR-CON) 20 MEQ tablet Take 1 tablet (20 mEq total) daily by mouth. 3 tablet 0   ranitidine (ZANTAC) 150 MG tablet Take 150 mg by mouth 2 (two) times daily.     Riboflavin (VITAMIN B2 PO) Take 500 mg by mouth daily.     tamsulosin (FLOMAX) 0.4 MG CAPS capsule Take 0.4 mg by mouth daily after supper.     thiamine 100 MG tablet Take by mouth.     traMADol (ULTRAM) 50 MG tablet 50 mg. 2 tablets 4 times a day     umeclidinium-vilanterol (ANORO ELLIPTA) 62.5-25 MCG/ACT AEPB Inhale into the lungs.     Cyanocobalamin (VITAMIN B-12 IJ) Inject 1,000 mcg as directed once a week.     ipratropium-albuterol (DUONEB) 0.5-2.5 (3) MG/3ML SOLN Take 3 mLs by nebulization every 6 (six) hours as needed. For shortness of breath.     omeprazole (PRILOSEC) 40 MG capsule Take 1 capsule (40 mg total) by mouth 2 (two) times daily. (Patient not taking: Reported on 04/02/2022) 60 capsule 5   sildenafil (VIAGRA) 100 MG tablet Take 100 mg by mouth as needed.     TRULICITY 1.5 MG/0.5ML SOPN Inject  1.5 mg into the skin once a week.     Current Facility-Administered Medications  Medication Dose Route Frequency Provider Last Rate Last Admin   0.9 %  sodium chloride infusion  500 mL Intravenous Continuous Imogene Burn, MD       dextrose 10 % infusion   Intravenous Continuous Imogene Burn, MD        Allergies as of 04/02/2022 - Review Complete 04/02/2022  Allergen Reaction Noted   Ace inhibitors Swelling 03/25/2016   Latex Hives 10/01/2010   Ibuprofen Rash 02/16/2017    Family History  Problem Relation Age of Onset   Diabetes Mother    Hypertension Mother    Hyperlipidemia Mother    Heart attack Mother        x2   Cancer Mother    Heart disease Mother        MI x2   Liver cancer Mother    Ovarian cancer Mother    Hyperlipidemia Father    Diabetes Father    Hypertension Father    COPD Father    Heart failure Father    Prostate cancer Father    Ulcerative colitis Father    Hypertension Sister    Anemia Sister    Hyperlipidemia Sister    Thyroid disease Brother    Obesity Brother    Breast cancer Maternal Grandmother    Colon polyps Maternal Grandmother    Diabetes Maternal Grandmother    Diabetes Maternal Grandfather    Breast cancer Paternal Grandmother    Heart attack Paternal Grandmother 76   Heart disease Paternal Grandmother        MI IN 70'S   Pancreatic cancer Paternal Grandmother    Stomach cancer Paternal Grandmother    Diabetes Paternal Grandmother  Colon cancer Paternal Grandfather    Diabetes Paternal Grandfather    Hypertension Maternal Uncle    Kidney disease Maternal Uncle        KIDNEY FAILURE   Diabetes Maternal Uncle    Kidney failure Maternal Uncle     Social History   Socioeconomic History   Marital status: Married    Spouse name: Not on file   Number of children: 5   Years of education: Not on file   Highest education level: Not on file  Occupational History   Occupation: CNA    Employer: [employer]    Comment:  unemployed   Occupation: disabled  Tobacco Use   Smoking status: Former    Packs/day: 0.50    Years: 7.00    Total pack years: 3.50    Types: Cigarettes    Quit date: 02/15/2022    Years since quitting: 0.1   Smokeless tobacco: Former  Building services engineerVaping Use   Vaping Use: Never used  Substance and Sexual Activity   Alcohol use: Yes    Alcohol/week: 2.0 - 3.0 standard drinks of alcohol    Types: 2 - 3 Standard drinks or equivalent per week    Comment: 2 per day   Drug use: No   Sexual activity: Yes  Other Topics Concern   Not on file  Social History Narrative   SINGLE   REG EXERCISE---NO   Social Determinants of Health   Financial Resource Strain: Not on file  Food Insecurity: Not on file  Transportation Needs: Not on file  Physical Activity: Not on file  Stress: Not on file  Social Connections: Not on file  Intimate Partner Violence: Not on file    Physical Exam: Vital signs in last 24 hours: BP 100/64   Pulse 67   Temp 98 F (36.7 C)   Ht 5\' 9"  (1.753 m)   Wt 183 lb (83 kg)   SpO2 100%   BMI 27.02 kg/m  GEN: NAD EYE: Sclerae anicteric ENT: MMM CV: Non-tachycardic Pulm: No increased WOB GI: Soft NEURO:  Alert & Oriented   Eulah Pontlaire Jenesys Casseus, MD [facility]   04/02/2022 1:16 PM

## 2022-04-02 NOTE — Progress Notes (Signed)
Called to room to assist during endoscopic procedure.  Patient ID and intended procedure confirmed with present staff. Received instructions for my participation in the procedure from the performing physician.  

## 2022-04-02 NOTE — Progress Notes (Signed)
Report to PACU, RN, vss, BBS= Clear.  

## 2022-04-02 NOTE — Progress Notes (Addendum)
Patient CBG on admission is 17.  Patient is asymptomatic.  MD made aware.  D5w not available for use.  D10w hung and per policy will recheck CBG in 15 minutes.  [id number]

## 2022-04-02 NOTE — Patient Instructions (Signed)
Awaiting pathology results Will obtain CTA A/P with contrast to evaluate for mesenteric ischemia. Return to GI clinic in 4 weeks.   YOU HAD AN ENDOSCOPIC PROCEDURE TODAY AT THE [facility]:   Refer to the procedure report that was given to you for any specific questions about what was found during the examination.  If the procedure report does not answer your questions, please call your gastroenterologist to clarify.  If you requested that your care partner not be given the details of your procedure findings, then the procedure report has been included in a sealed envelope for you to review at your convenience later.  YOU SHOULD EXPECT: Some feelings of bloating in the abdomen. Passage of more gas than usual.  Walking can help get rid of the air that was put into your GI tract during the procedure and reduce the bloating. If you had a lower endoscopy (such as a colonoscopy or flexible sigmoidoscopy) you may notice spotting of blood in your stool or on the toilet paper. If you underwent a bowel prep for your procedure, you may not have a normal bowel movement for a few days.  Please Note:  You might notice some irritation and congestion in your nose or some drainage.  This is from the oxygen used during your procedure.  There is no need for concern and it should clear up in a day or so.  SYMPTOMS TO REPORT IMMEDIATELY:  Following upper endoscopy (EGD)  Vomiting of blood or coffee ground material  New chest pain or pain under the shoulder blades  Painful or persistently difficult swallowing  New shortness of breath  Fever of 100F or higher  Black, tarry-looking stools  For urgent or emergent issues, a gastroenterologist can be reached at any hour by calling [phone]. Do not use MyChart messaging for urgent concerns.    DIET:  We do recommend a small meal at first, but then you may proceed to your regular diet.  Drink plenty of fluids but you should avoid alcoholic beverages  for 24 hours.  ACTIVITY:  You should plan to take it easy for the rest of today and you should NOT DRIVE or use heavy machinery until tomorrow (because of the sedation medicines used during the test).    FOLLOW UP: Our staff will call the number listed on your records the next business day following your procedure.  We will call around 7:15- 8:00 am to check on you and address any questions or concerns that you may have regarding the information given to you following your procedure. If we do not reach you, we will leave a message.     If any biopsies were taken you will be contacted by phone or by letter within the next 1-3 weeks.  Please call us at [phone] if you have not heard about the biopsies in 3 weeks.    SIGNATURES/CONFIDENTIALITY: You and/or your care partner have signed paperwork which will be entered into your electronic medical record.  These signatures attest to the fact that that the information above on your After Visit Summary has been reviewed and is understood.  Full responsibility of the confidentiality of this discharge information lies with you and/or your care-partner.

## 2022-04-02 NOTE — Progress Notes (Signed)
Vital signs checked by:DT  The medical and surgical history was reviewed and verified with the patient.  

## 2022-04-02 NOTE — Op Note (Signed)
[facility] Patient Name: Craig Rosales Procedure Date: 04/02/2022 1:30 PM MRN: [id number] Endoscopist: Georgian Co , , [id number] Age: 41 Referring MD:  Date of Birth: 09-Apr-1981 Gender: Male Account #: [id number] Procedure:                Upper GI endoscopy Indications:              Epigastric abdominal pain, Melena Medicines:                Monitored Anesthesia Care Procedure:                Pre-Anesthesia Assessment:                           - Prior to the procedure, a History and Physical                            was performed, and patient medications and                            allergies were reviewed. The patient's tolerance of                            previous anesthesia was also reviewed. The risks                            and benefits of the procedure and the sedation                            options and risks were discussed with the patient.                            All questions were answered, and informed consent                            was obtained. Prior Anticoagulants: The patient has                            taken no anticoagulant or antiplatelet agents. ASA                            Grade Assessment: III - A patient with severe                            systemic disease. After reviewing the risks and                            benefits, the patient was deemed in satisfactory                            condition to undergo the procedure.                           After obtaining informed consent, the endoscope was  passed under direct vision. Throughout the                            procedure, the patient's blood pressure, pulse, and                            oxygen saturations were monitored continuously. The                            Endoscope was introduced through the mouth, and                            advanced to the second part of duodenum. The upper                            GI  endoscopy was accomplished without difficulty.                            The patient tolerated the procedure well. Scope In: Scope Out: Findings:                 The examined esophagus was normal. Biopsies were                            taken with a cold forceps for histology.                           Evidence of a gastric bypass was found. A gastric                            pouch with a normal size was found. The staple line                            appeared intact. The gastrojejunal anastomosis was                            characterized by healthy appearing mucosa. This was                            traversed. The pouch-to-jejunum limb was                            characterized by healthy appearing mucosa. Biopsies                            were taken of the gastric pouch with a cold forceps                            for histology to rule out H pylori.                           The examined jejunum was normal. Biopsies were  taken with a cold forceps for histology. Complications:            No immediate complications. Estimated Blood Loss:     Estimated blood loss was minimal. Impression:               - Normal esophagus. Biopsied.                           - Gastric bypass with a normal-sized pouch and                            intact staple line. Gastrojejunal anastomosis                            characterized by healthy appearing mucosa. Biopsied.                           - Normal examined jejunum. Biopsied. Recommendation:           - Discharge patient to home (with escort).                           - Await pathology results.                           - Will obtain CTA A/P w/contrast to evaluate for                            mesenteric ischemia.                           - Return to GI clinic in 4 weeks.                           - The findings and recommendations were discussed                            with the patient. Dr Georgian Co "Lyndee Leo" Lorenso Courier,  04/02/2022 1:47:00 PM

## 2022-04-03 ENCOUNTER — Telehealth: Payer: Self-pay | Admitting: *Deleted

## 2022-04-03 NOTE — Telephone Encounter (Signed)
  Follow up Call-     04/02/2022   12:34 PM 12/05/2021    1:30 PM 08/18/2021    9:49 AM  Call back number  Post procedure Call Back phone  # [phone]  Permission to leave phone message Yes Yes Yes     Patient questions:  Message left to call us if necessary.

## 2022-04-07 ENCOUNTER — Encounter: Payer: Self-pay | Admitting: Internal Medicine

## 2022-04-16 ENCOUNTER — Ambulatory Visit (HOSPITAL_COMMUNITY)
Admission: RE | Admit: 2022-04-16 | Discharge: 2022-04-16 | Disposition: A | Discharge status: Home / Self Care | Payer: Commercial Managed Care - PPO | Source: Ambulatory Visit | Attending: Internal Medicine | Admitting: Internal Medicine

## 2022-04-16 DIAGNOSIS — R1013 Epigastric pain: Secondary | ICD-10-CM

## 2022-04-16 MED ORDER — SODIUM CHLORIDE (PF) 0.9 % IJ SOLN
INTRAMUSCULAR | Status: AC
  Filled 2022-04-16: qty 50

## 2022-04-16 MED ORDER — IOHEXOL 350 MG/ML SOLN
100.0000 mL | Freq: Once | INTRAVENOUS | Status: AC | PRN
  Administered 2022-04-16: 100 mL via INTRAVENOUS

## 2022-04-16 NOTE — Progress Notes (Signed)
Hi Craig Rosales, please call the patient to let him know that I would like for additional labs to evaluate his ab pain: Alpha gal IgE, serum 5-HIAA, serum tryptase, C4, C1 esterase inhibitor, urine test for porphobilinogen (PBG).

## 2022-04-17 ENCOUNTER — Other Ambulatory Visit: Payer: Self-pay

## 2022-04-17 DIAGNOSIS — R7989 Other specified abnormal findings of blood chemistry: Secondary | ICD-10-CM

## 2022-04-17 DIAGNOSIS — R1013 Epigastric pain: Secondary | ICD-10-CM

## 2022-04-21 ENCOUNTER — Other Ambulatory Visit: Payer: Medicare Other

## 2022-04-21 DIAGNOSIS — R1013 Epigastric pain: Secondary | ICD-10-CM

## 2022-04-21 DIAGNOSIS — R7989 Other specified abnormal findings of blood chemistry: Secondary | ICD-10-CM

## 2022-04-24 LAB — PORPHOBILINOGEN, RANDOM URINE: Porphobilinogen, Rand Ur: 0.2 mg/L (ref 0.0–2.0)

## 2022-04-27 ENCOUNTER — Encounter: Payer: Self-pay | Admitting: Internal Medicine

## 2022-04-30 ENCOUNTER — Ambulatory Visit (INDEPENDENT_AMBULATORY_CARE_PROVIDER_SITE_OTHER): Payer: Commercial Managed Care - PPO | Admitting: Internal Medicine

## 2022-04-30 ENCOUNTER — Encounter: Payer: Self-pay | Admitting: Internal Medicine

## 2022-04-30 ENCOUNTER — Ambulatory Visit: Payer: Commercial Managed Care - PPO | Admitting: Internal Medicine

## 2022-04-30 VITALS — BP 120/68 | HR 90 | Ht 69.0 in | Wt 194.0 lb

## 2022-04-30 DIAGNOSIS — R1013 Epigastric pain: Secondary | ICD-10-CM | POA: Diagnosis not present

## 2022-04-30 DIAGNOSIS — R140 Abdominal distension (gaseous): Secondary | ICD-10-CM

## 2022-04-30 DIAGNOSIS — R17 Unspecified jaundice: Secondary | ICD-10-CM

## 2022-04-30 DIAGNOSIS — R748 Abnormal levels of other serum enzymes: Secondary | ICD-10-CM

## 2022-04-30 LAB — 5-HIAA, PLASMA: 5-HIAA, Plasma: 6.2 ng/mL

## 2022-04-30 MED ORDER — LINACLOTIDE 145 MCG PO CAPS: 145.0000 ug | ORAL_CAPSULE | Freq: Every day | ORAL | 2 refills | Status: DC

## 2022-04-30 NOTE — Progress Notes (Signed)
Chief Complaint: Epigastric ab pain  HPI:    Craig Rosales is a 41 year old African-American male with a past medical history as listed below including gastric bypass and GERD presents with epigastric ab pain     Interval History: His abdomen has been more distended. He feels like it has been more difficult to have a BM because he used to have 2-3 BMs per day. He is now having on average 1 BM per day. He doesn't feel like he is fully emptying when he does have a BM. Denies hematochezia or melena. He has one severe episode of ab pain every day in the upper abdomen. This pain can occur even before he eats.  Endorses nausea but no vomiting. He stopped eating meat for about 2 months but went to a vegetarian diet, which has not helped significantly. Endorses some nausea. Denies vomiting.   Wt Readings from Last 3 Encounters:  04/30/22 194 lb (88 kg)  04/02/22 183 lb (83 kg)  03/26/22 183 lb 6.4 oz (83.2 kg)   Current Outpatient Medications  Medication Sig Dispense Refill   acetaminophen (TYLENOL) 325 MG tablet Take 650 mg by mouth every 6 (six) hours as needed.     albuterol (VENTOLIN HFA) 108 (90 Base) MCG/ACT inhaler Inhale 2 puffs into the lungs every 6 (six) hours as needed. For shortness of breath.     allopurinol (ZYLOPRIM) 100 MG tablet Take 100 mg by mouth daily.     AMBULATORY NON FORMULARY MEDICATION 5ml viscoux lidocaine 39ml 10/66ml dicylomine  Maalox  Total of 270 mL 1   Ascorbic Acid (VITAMIN C) 500 MG tablet Take 500 mg by mouth daily.       aspirin 81 MG tablet Take 81 mg by mouth daily.     atorvastatin (LIPITOR) 40 MG tablet Take 80 mg by mouth at bedtime.      cetirizine (ZYRTEC) 10 MG tablet Take 10 mg by mouth daily.     cholecalciferol (VITAMIN D) 1000 UNITS tablet Take 1,000 Units by mouth daily.     Cyanocobalamin (VITAMIN B-12 IJ) Inject 1,000 mcg as directed once a week.     famotidine (PEPCID) 40 MG tablet Take 1 tablet (40 mg total) by mouth 2 (two)  times daily. 60 tablet 5   ferrous gluconate (FERGON) 324 MG tablet Take 324 mg by mouth daily with breakfast.     FLUoxetine (PROZAC) 40 MG capsule Take 40 mg by mouth daily.     fluticasone (FLONASE) 50 MCG/ACT nasal spray Place 2 sprays into the nose daily.     fluticasone (FLOVENT HFA) 44 MCG/ACT inhaler Inhale 2 puffs into the lungs 2 (two) times daily.     furosemide (LASIX) 40 MG tablet Take 40 mg by mouth daily.     gabapentin (NEURONTIN) 300 MG capsule Take 1,200 mg by mouth 3 (three) times daily.      ipratropium-albuterol (DUONEB) 0.5-2.5 (3) MG/3ML SOLN Take 3 mLs by nebulization every 6 (six) hours as needed. For shortness of breath.     levothyroxine (SYNTHROID, LEVOTHROID) 25 MCG tablet Take 25 mcg by mouth daily before breakfast.     linaclotide (LINZESS) 145 MCG CAPS capsule Take 1 capsule (145 mcg total) by mouth daily before breakfast. 30 capsule 2   omeprazole (PRILOSEC) 40 MG capsule Take 1 capsule (40 mg total) by mouth 2 (two) times daily. 60 capsule 5   potassium chloride SA (K-DUR,KLOR-CON) 20 MEQ tablet Take 1 tablet (20 mEq total) daily by  mouth. 3 tablet 0   ranitidine (ZANTAC) 150 MG tablet Take 150 mg by mouth 2 (two) times daily.     Riboflavin (VITAMIN B2 PO) Take 500 mg by mouth daily.     sildenafil (VIAGRA) 100 MG tablet Take 100 mg by mouth as needed.     tamsulosin (FLOMAX) 0.4 MG CAPS capsule Take 0.4 mg by mouth daily after supper.     thiamine 100 MG tablet Take by mouth.     traMADol (ULTRAM) 50 MG tablet 50 mg. 2 tablets 4 times a day     TRULICITY 1.5 MG/0.5ML SOPN Inject 1.5 mg into the skin once a week.     umeclidinium-vilanterol (ANORO ELLIPTA) 62.5-25 MCG/ACT AEPB Inhale into the lungs.     Current Facility-Administered Medications  Medication Dose Route Frequency Provider Last Rate Last Admin   dextrose 10 % infusion   Intravenous Continuous Imogene Burn, MD        Physical Exam:  Vital signs: BP 120/68   Pulse 90   Ht 5\' 9"  (1.753 m)    Wt 194 lb (88 kg)   SpO2 98%   BMI 28.65 kg/m    Constitutional:   Pleasant thin appearing AA male appears to be in NAD, Well developed, Well nourished, alert and cooperative Respiratory: Respirations even and unlabored. Lungs clear to auscultation bilaterally.   No wheezes, crackles, or rhonchi.  Cardiovascular: Normal S1, S2. No MRG. Regular rate and rhythm. No peripheral edema, cyanosis or pallor.  Gastrointestinal:  Soft, nondistended, tender in the epigastric area Normal bowel sounds. No appreciable masses or hepatomegaly. Rectal:  Not performed.  Psychiatric: Demonstrates good judgement and reason without abnormal affect or behaviors.  RELEVANT LABS AND IMAGING: CBC    Component Value Date/Time   WBC 5.8 04/03/2017 0040   RBC 4.97 04/03/2017 0040   HGB 13.6 04/03/2017 0040   HGB 14.4 06/07/2013 1443   HCT 42.5 04/03/2017 0040   HCT 44.4 06/07/2013 1443   PLT 156 04/03/2017 0040   PLT 175 06/07/2013 1443   MCV 85.5 04/03/2017 0040   MCV 85 06/07/2013 1443   MCH 27.4 04/03/2017 0040   MCHC 32.0 04/03/2017 0040   RDW 13.9 04/03/2017 0040   RDW 14.5 06/07/2013 1443   LYMPHSABS 1.8 02/16/2017 0545   LYMPHSABS 2.3 01/06/2012 0315   MONOABS 0.4 02/16/2017 0545   MONOABS 0.7 01/06/2012 0315   EOSABS 0.0 02/16/2017 0545   EOSABS 0.0 01/06/2012 0315   BASOSABS 0.0 02/16/2017 0545   BASOSABS 0.0 01/06/2012 0315    CMP     Component Value Date/Time   NA 140 04/03/2017 0040   NA 134 (L) 06/08/2013 0408   K 3.0 (L) 04/03/2017 0040   K 3.7 06/08/2013 0408   CL 107 04/03/2017 0040   CL 99 06/08/2013 0408   CO2 24 04/03/2017 0040   CO2 29 06/08/2013 0408   GLUCOSE 160 (H) 04/03/2017 0040   GLUCOSE 373 (H) 06/08/2013 0408   BUN 8 04/03/2017 0040   BUN 7 06/08/2013 0408   CREATININE 0.84 04/03/2017 0040   CREATININE 1.04 06/08/2013 0408   CALCIUM 9.3 04/03/2017 0040   CALCIUM 9.1 06/08/2013 0408   PROT 7.1 06/30/2021 1239   PROT 8.3 (H) 06/07/2013 1443   ALBUMIN 4.6  06/30/2021 1239   ALBUMIN 4.3 06/07/2013 1443   AST 24 06/30/2021 1239   AST 22 06/07/2013 1443   ALT 26 06/30/2021 1239   ALT 33 06/07/2013 1443   ALKPHOS 70  06/30/2021 1239   ALKPHOS 75 06/07/2013 1443   BILITOT 1.6 (H) 07/14/2021 0829   BILITOT 0.7 06/07/2013 1443   GFRNONAA >60 04/03/2017 0040   GFRNONAA >60 06/08/2013 0408   GFRAA >60 04/03/2017 0040   GFRAA >60 06/08/2013 0408   Labs 02/2022: Elevated lipase of 191. Elevated total bilirubin of 1.7  Labs 04/2022: C4 complement normal. Alpha gal panel negative. Serum 5-HIAA normal.  Urine porphobilinogen normal.   CTA A/P w/contrast 04/16/22: IMPRESSION: VASCULAR 1. Widely patent mesenteric vasculature. No significant atherosclerotic plaque or findings of mesenteric ischemia. NON-VASCULAR 1. No acute findings in the abdomen or pelvis. Postsurgical changes of gastric bypass without complicating feature by imaging.  EGD 04/02/22: - Normal esophagus. Biopsied. - Gastric bypass with a normal-sized pouch and intact staple line. Gastrojejunal anastomosis characterized by healthy appearing mucosa. Biopsied. - Normal examined jejunum. Biopsied. Path: 1. Surgical [P], duodenal bx - BENIGN SMALL BOWEL MUCOSA WITH NO SIGNIFICANT PATHOLOGIC CHANGES 2. Surgical [P], gastric bx - GASTRIC ANTRAL AND OXYNTIC MUCOSA WITH FEATURES OF REACTIVE GASTROPATHY - NEGATIVE FOR H. PYLORI ON H&E STAIN - NEGATIVE FOR INTESTINAL METAPLASIA OR MALIGNANCY 3. Surgical [P], esophagus bx - SQUAMOUS MUCOSA WITH MILD REFLUX CHANGES - NEGATIVE FOR INCREASED INTRAEPITHELIAL EOSINOPHILS - NEGATIVE FOR DYSPLASIA OR MALIGNANCY  Assessment: Epigastric pain Nausea Abdominal distension Elevated lipase Elevated total bilirubin Patient has continued to have difficulty with epigastric ab pain. He has not responded to carafate or PPI therapy. CTA did not show any signs of mesenteric ischemia. Prior work up has shown only a mildly elevated total bilirubin and  elevated lipase. However, imaging has not shown any signs of pancreatitis or biliary obstruction. Will plan to obtain a MRI/MRCP to see if this better elucidates any biliary tract or pancreatic etiology for his ab pain. Since patient also describes some abdominal distension and ab pain with difficulty passing stools, will plan to start him on Linzess therapy. He has previously not responded to OTC laxatives. - Follow up C1 esterase and tryptase labs - Check MRI A/P/MRCP w/contrast  - Start Linzess 145 mcg QD - RTC 6 weeks  Eulah Pont, MD  I spent 43  minutes of time, including in depth chart review, independent review of results as outlined above, communicating results with the patient directly, face-to-face time with the patient, coordinating care, ordering studies and medications as appropriate, and documentation.

## 2022-04-30 NOTE — Patient Instructions (Addendum)
We have sent the following medications to your pharmacy for you to pick up at your convenience: Start taking linzess 145 mg 1 capsule before breakfast   You are scheduled for follow up visit 07/02/22 at 10:10 am  If you are age 41 or older, your body mass index should be between 23-30. Your Body mass index is 28.65 kg/m. If this is out of the aforementioned range listed, please consider follow up with your Primary Care Provider.  If you are age 18 or younger, your body mass index should be between 19-25. Your Body mass index is 28.65 kg/m. If this is out of the aformentioned range listed, please consider follow up with your Primary Care Provider.   ________________________________________________________  The [facility] providers would like to encourage you to use [facility] to communicate with providers for non-urgent requests or questions.  Due to long hold times on the telephone, sending your provider a message by [facility] may be a faster and more efficient way to get a response.  Please allow 48 business hours for a response.  Please remember that this is for non-urgent requests.   You have been scheduled for an MRI at [facility] on 05/09/22. Your appointment time is 8 am. Please arrive at 7:30 am to admitting (at main entrance of the hospital)   prior to your appointment time for registration purposes. Please make certain not to have anything to eat or drink 4 hours prior to your test. In addition, if you have any metal in your body, have a pacemaker or defibrillator, please be sure to let your ordering physician know. This test typically takes 45 minutes to 1 hour to complete. Should you need to reschedule, please call [phone] to do so.   Due to recent changes in healthcare laws, you may see the results of your imaging and laboratory studies on MyChart before your provider has had a chance to review them.  We understand that in some cases there may be results that are confusing or concerning  to you. Not all laboratory results come back in the same time frame and the provider may be waiting for multiple results in order to interpret others.  Please give [place] 48 hours in order for your provider to thoroughly review all the results before contacting the office for clarification of your results.

## 2022-05-02 LAB — C1 ESTERASE INHIBITOR, FUNCTIONAL: C1 Esterase Inhibitor Funct: >100 % (ref >=68)

## 2022-05-02 LAB — ALPHA-GAL PANEL
Allergen, Mutton, f88: <0.1 kU/L
Allergen, Pork, f26: <0.1 kU/L
Beef: <0.1 kU/L
CLASS: 0
CLASS: 0
Class: 0
GALACTOSE-ALPHA-1,3-GALACTOSE IGE*: <0.1 kU/L (ref <0.10)

## 2022-05-02 LAB — TRYPTASE: Tryptase: 5.3 mcg/L (ref <11.0)

## 2022-05-02 LAB — C4 COMPLEMENT: C4 Complement: 21 mg/dL (ref 15–53)

## 2022-05-02 LAB — INTERPRETATION:

## 2022-05-09 ENCOUNTER — Ambulatory Visit (HOSPITAL_COMMUNITY): Admission: RE | Admit: 2022-05-09 | Payer: Commercial Managed Care - PPO | Source: Ambulatory Visit

## 2022-05-09 ENCOUNTER — Ambulatory Visit (HOSPITAL_COMMUNITY): Payer: Commercial Managed Care - PPO

## 2022-05-09 ENCOUNTER — Encounter (HOSPITAL_COMMUNITY): Payer: Self-pay

## 2022-07-02 ENCOUNTER — Ambulatory Visit (INDEPENDENT_AMBULATORY_CARE_PROVIDER_SITE_OTHER): Payer: Medicare Other | Admitting: Internal Medicine

## 2022-07-02 ENCOUNTER — Encounter: Payer: Self-pay | Admitting: Internal Medicine

## 2022-07-02 VITALS — BP 114/70 | HR 72 | Ht 69.0 in | Wt 199.0 lb

## 2022-07-02 DIAGNOSIS — K5900 Constipation, unspecified: Secondary | ICD-10-CM | POA: Diagnosis not present

## 2022-07-02 DIAGNOSIS — R1010 Upper abdominal pain, unspecified: Secondary | ICD-10-CM

## 2022-07-02 DIAGNOSIS — R1310 Dysphagia, unspecified: Secondary | ICD-10-CM | POA: Diagnosis not present

## 2022-07-02 MED ORDER — LINACLOTIDE 290 MCG PO CAPS: 290.0000 ug | ORAL_CAPSULE | Freq: Every day | ORAL | 3 refills | Status: AC

## 2022-07-02 NOTE — Progress Notes (Signed)
Chief Complaint: Epigastric ab pain  HPI: Craig Rosales is a 42 year old African-American male with a past medical history of DM, GERD, OSA, migraine, asthma, and gastric bypass presents with epigastric ab pain     Interval History: He went to the ED in 04/2022 for dysphagia, which was attributed to GERD. When he eats or drinks something, it takes a while for it go down his chest. He is still having epigastric abdominal pain. He is taking the omeprazole 40 mg BID, which is helping with the pain but just not fully. He still feels bloated. His Linzess doesn't seem to working. He is on average having one BM every other day. He doesn't feel like he is clearing himself out when he does have a BM. He eats normally. Weight has been going up. He is drinking plenty of water. He is trying to eat more fiber and is physically active. He has been on Trulicity for the last 6 months.   Wt Readings from Last 3 Encounters:  07/02/22 199 lb (90.3 kg)  04/30/22 194 lb (88 kg)  04/02/22 183 lb (83 kg)   Current Outpatient Medications  Medication Sig Dispense Refill   acetaminophen (TYLENOL) 325 MG tablet Take 650 mg by mouth every 6 (six) hours as needed.     albuterol (VENTOLIN HFA) 108 (90 Base) MCG/ACT inhaler Inhale 2 puffs into the lungs every 6 (six) hours as needed. For shortness of breath.     allopurinol (ZYLOPRIM) 100 MG tablet Take 100 mg by mouth daily.     AMBULATORY NON FORMULARY MEDICATION 76m viscoux lidocaine 963m10/25m21micylomine  270m4malox  Total of 450ml20m mL 1   Ascorbic Acid (VITAMIN C) 500 MG tablet Take 500 mg by mouth daily.       aspirin 81 MG tablet Take 81 mg by mouth daily.     atorvastatin (LIPITOR) 40 MG tablet Take 80 mg by mouth at bedtime.      cetirizine (ZYRTEC) 10 MG tablet Take 10 mg by mouth daily.     cholecalciferol (VITAMIN D) 1000 UNITS tablet Take 1,000 Units by mouth daily.     Cyanocobalamin (VITAMIN B-12 IJ) Inject 1,000 mcg as directed once a week.      famotidine (PEPCID) 40 MG tablet Take 1 tablet (40 mg total) by mouth 2 (two) times daily. 60 tablet 5   ferrous gluconate (FERGON) 324 MG tablet Take 324 mg by mouth daily with breakfast.     FLUoxetine (PROZAC) 40 MG capsule Take 40 mg by mouth daily.     fluticasone (FLONASE) 50 MCG/ACT nasal spray Place 2 sprays into the nose daily.     fluticasone (FLOVENT HFA) 44 MCG/ACT inhaler Inhale 2 puffs into the lungs 2 (two) times daily.     furosemide (LASIX) 40 MG tablet Take 40 mg by mouth daily.     gabapentin (NEURONTIN) 300 MG capsule Take 1,200 mg by mouth 3 (three) times daily.      ipratropium-albuterol (DUONEB) 0.5-2.5 (3) MG/3ML SOLN Take 3 mLs by nebulization every 6 (six) hours as needed. For shortness of breath.     levothyroxine (SYNTHROID, LEVOTHROID) 25 MCG tablet Take 25 mcg by mouth daily before breakfast.     linaclotide (LINZESS) 145 MCG CAPS capsule Take 1 capsule (145 mcg total) by mouth daily before breakfast. 30 capsule 2   omeprazole (PRILOSEC) 40 MG capsule Take 1 capsule (40 mg total) by mouth 2 (two) times daily. 60 capsule 5   potassium  chloride SA (K-DUR,KLOR-CON) 20 MEQ tablet Take 1 tablet (20 mEq total) daily by mouth. 3 tablet 0   ranitidine (ZANTAC) 150 MG tablet Take 150 mg by mouth 2 (two) times daily.     Riboflavin (VITAMIN B2 PO) Take 500 mg by mouth daily.     sildenafil (VIAGRA) 100 MG tablet Take 100 mg by mouth as needed.     tamsulosin (FLOMAX) 0.4 MG CAPS capsule Take 0.4 mg by mouth daily after supper.     thiamine 100 MG tablet Take by mouth.     traMADol (ULTRAM) 50 MG tablet 50 mg. 2 tablets 4 times a day     TRULICITY 1.5 0000000 SOPN Inject 1.5 mg into the skin once a week.     umeclidinium-vilanterol (ANORO ELLIPTA) 62.5-25 MCG/ACT AEPB Inhale into the lungs.     Current Facility-Administered Medications  Medication Dose Route Frequency Provider Last Rate Last Admin   dextrose 10 % infusion   Intravenous Continuous Sharyn Creamer, MD         Physical Exam:  Vital signs: BP 114/70 (BP Location: Left Arm, Patient Position: Sitting, Cuff Size: Normal)   Pulse 72   Ht 5' 9"$  (1.753 m)   Wt 199 lb (90.3 kg)   BMI 29.39 kg/m    Constitutional:   Pleasant thin appearing AA male appears to be in NAD, Well developed, Well nourished, alert and cooperative Respiratory: Respirations even and unlabored. Lungs clear to auscultation bilaterally.   No wheezes, crackles, or rhonchi.  Cardiovascular: Normal S1, S2. No MRG. Regular rate and rhythm. No peripheral edema, cyanosis or pallor.  Gastrointestinal:  Soft, nondistended, tender in the epigastric area Normal bowel sounds. No appreciable masses or hepatomegaly. Rectal:  Not performed.  Psychiatric: Demonstrates good judgement and reason without abnormal affect or behaviors.  RELEVANT LABS AND IMAGING: CBC    Component Value Date/Time   WBC 5.8 04/03/2017 0040   RBC 4.97 04/03/2017 0040   HGB 13.6 04/03/2017 0040   HGB 14.4 06/07/2013 1443   HCT 42.5 04/03/2017 0040   HCT 44.4 06/07/2013 1443   PLT 156 04/03/2017 0040   PLT 175 06/07/2013 1443   MCV 85.5 04/03/2017 0040   MCV 85 06/07/2013 1443   MCH 27.4 04/03/2017 0040   MCHC 32.0 04/03/2017 0040   RDW 13.9 04/03/2017 0040   RDW 14.5 06/07/2013 1443   LYMPHSABS 1.8 02/16/2017 0545   LYMPHSABS 2.3 01/06/2012 0315   MONOABS 0.4 02/16/2017 0545   MONOABS 0.7 01/06/2012 0315   EOSABS 0.0 02/16/2017 0545   EOSABS 0.0 01/06/2012 0315   BASOSABS 0.0 02/16/2017 0545   BASOSABS 0.0 01/06/2012 0315    CMP     Component Value Date/Time   NA 140 04/03/2017 0040   NA 134 (L) 06/08/2013 0408   K 3.0 (L) 04/03/2017 0040   K 3.7 06/08/2013 0408   CL 107 04/03/2017 0040   CL 99 06/08/2013 0408   CO2 24 04/03/2017 0040   CO2 29 06/08/2013 0408   GLUCOSE 160 (H) 04/03/2017 0040   GLUCOSE 373 (H) 06/08/2013 0408   BUN 8 04/03/2017 0040   BUN 7 06/08/2013 0408   CREATININE 0.84 04/03/2017 0040   CREATININE 1.04 06/08/2013 0408    CALCIUM 9.3 04/03/2017 0040   CALCIUM 9.1 06/08/2013 0408   PROT 7.1 06/30/2021 1239   PROT 8.3 (H) 06/07/2013 1443   ALBUMIN 4.6 06/30/2021 1239   ALBUMIN 4.3 06/07/2013 1443   AST 24 06/30/2021 1239  AST 22 06/07/2013 1443   ALT 26 06/30/2021 1239   ALT 33 06/07/2013 1443   ALKPHOS 70 06/30/2021 1239   ALKPHOS 75 06/07/2013 1443   BILITOT 1.6 (H) 07/14/2021 0829   BILITOT 0.7 06/07/2013 1443   GFRNONAA >60 04/03/2017 0040   GFRNONAA >60 06/08/2013 0408   GFRAA >60 04/03/2017 0040   GFRAA >60 06/08/2013 0408   Labs 02/2022: Elevated lipase of 191. Elevated total bilirubin of 1.7  Labs 04/2022: C4 complement normal. Alpha gal panel negative. Serum 5-HIAA normal.  Urine porphobilinogen normal. C1 esterase inhibitor nml. Tryptase nml.   CTA A/P w/contrast 04/16/22: IMPRESSION: VASCULAR 1. Widely patent mesenteric vasculature. No significant atherosclerotic plaque or findings of mesenteric ischemia. NON-VASCULAR 1. No acute findings in the abdomen or pelvis. Postsurgical changes of gastric bypass without complicating feature by imaging.  CT A/P w/o contrast 05/05/22: 1. Mild left hydronephrosis appears stable to improved in the  interval. No obstructing stone identified.  2. Normal appearance of the right kidney.  3. Mild circumferential wall thickening of the urinary bladder is  new from previous exam and may reflect incomplete distension.  Correlate for any clinical signs or symptoms of cystitis.  4. Moderate stool burden noted throughout the colon. Correlate for  any clinical signs or symptoms of constipation.   Colonoscopy 12/05/21: Normal colonoscopy.  EGD 04/02/22: - Normal esophagus. Biopsied. - Gastric bypass with a normal-sized pouch and intact staple line. Gastrojejunal anastomosis characterized by healthy appearing mucosa. Biopsied. - Normal examined jejunum. Biopsied. Path: 1. Surgical [P], duodenal bx - BENIGN SMALL BOWEL MUCOSA WITH NO SIGNIFICANT  PATHOLOGIC CHANGES 2. Surgical [P], gastric bx - GASTRIC ANTRAL AND OXYNTIC MUCOSA WITH FEATURES OF REACTIVE GASTROPATHY - NEGATIVE FOR H. PYLORI ON H&E STAIN - NEGATIVE FOR INTESTINAL METAPLASIA OR MALIGNANCY 3. Surgical [P], esophagus bx - SQUAMOUS MUCOSA WITH MILD REFLUX CHANGES - NEGATIVE FOR INCREASED INTRAEPITHELIAL EOSINOPHILS - NEGATIVE FOR DYSPLASIA OR MALIGNANCY  Assessment: Dysphagia GERD Epigastric pain Constipation Abdominal distension Elevated lipase Elevated total bilirubin Patient has continued to have some issues with epigastric ab pain and constipation. His pain has responded slightly to PPI therapy. Prior work up has shown only a mildly elevated total bilirubin and elevated lipase. However, imaging has not shown any signs of pancreatitis or biliary obstruction. Patient was previously recommended to get a MRI abd/MRCP, but he did not schedule this imaging. Will attempt to get his constipation under better control since this appears to be an ongoing issue. Can consider bowel clean out in the future if increasing his Linzess and using Dulcolax suppositories is not effective. His symptoms may be related to his ongoing use of Trulicity. Patient does describe some trouble swallowing today so will get him scheduled for an esophageal manometry test. - Increase Linzess from 145 mcg QD to 290 mcg QD - Dulcolax suppository QD PRN - Esophageal manometry test - Consider coming off of Trulicity in the future - Consider MRI abd/MRCP w/contrast in the future - RTC 6 weeks  Christia Reading, MD  I spent 42  minutes of time, including in depth chart review, independent review of results as outlined above, communicating results with the patient directly, face-to-face time with the patient, coordinating care, ordering studies and medications as appropriate, and documentation.

## 2022-07-02 NOTE — Patient Instructions (Signed)
_______________________________________________________  If your blood pressure at your visit was 140/90 or greater, please contact your primary care physician to follow up on this.  _______________________________________________________  If you are age 42 or older, your body mass index should be between 23-30. Your Body mass index is 29.39 kg/m. If this is out of the aforementioned range listed, please consider follow up with your Primary Care Provider.  If you are age 26 or younger, your body mass index should be between 19-25. Your Body mass index is 29.39 kg/m. If this is out of the aformentioned range listed, please consider follow up with your Primary Care Provider.   ________________________________________________________  The [facility] [place] [facility] providers would like to encourage you to use [facility] to communicate with providers for non-urgent requests or questions.  Due to long hold times on the telephone, sending your provider a message by [facility] may be a faster and more efficient way to get a response.  Please allow 48 business hours for a response.  Please remember that this is for non-urgent requests.  _______________________________________________________  Dennis Bast have been scheduled for an esophageal manometry test at [facility] on 12/02/2022 at 10:30am. Please arrive 30 minutes prior to your procedure for registration. You will need to go to outpatient registration (1st floor of the hospital) first. Make certain to bring your insurance cards as well as a complete list of medications.  Please remember the following:  1) Do not take any muscle relaxants, xanax (alprazolam) or ativan for 1 day prior to your test as well as the day of the test.  2) Nothing to eat or drink after 12:00 midnight on the night before your test.  3) Hold all diabetic medications/insulin the morning of the test. You may eat and take your medications after the test.  It will take at least 2 weeks to  receive the results of this test from your physician.  ------------------------------------------ ABOUT ESOPHAGEAL MANOMETRY Esophageal manometry (muh-NOM-uh-tree) is a test that gauges how well your esophagus works. Your esophagus is the long, muscular tube that connects your throat to your stomach. Esophageal manometry measures the rhythmic muscle contractions (peristalsis) that occur in your esophagus when you swallow. Esophageal manometry also measures the coordination and force exerted by the muscles of your esophagus.  During esophageal manometry, a thin, flexible tube (catheter) that contains sensors is passed through your nose, down your esophagus and into your stomach. Esophageal manometry can be helpful in diagnosing some mostly uncommon disorders that affect your esophagus.  Why it's done Esophageal manometry is used to evaluate the movement (motility) of food through the esophagus and into the stomach. The test measures how well the circular bands of muscle (sphincters) at the top and bottom of your esophagus open and close, as well as the pressure, strength and pattern of the wave of esophageal muscle contractions that moves food along.  What you can expect Esophageal manometry is an outpatient procedure done without sedation. Most people tolerate it well. You may be asked to change into a hospital gown before the test starts.  During esophageal manometry  While you are sitting up, a member of your health care team sprays your throat with a numbing medication or puts numbing gel in your nose or both.  A catheter is guided through your nose into your esophagus. The catheter may be sheathed in a water-filled sleeve. It doesn't interfere with your breathing. However, your eyes may water, and you may gag. You may have a slight nosebleed from irritation.  After the catheter is in place, you may be asked to lie on your back on an exam table, or you may be asked to remain seated.  You then  swallow small sips of water. As you do, a computer connected to the catheter records the pressure, strength and pattern of your esophageal muscle contractions.  During the test, you'll be asked to breathe slowly and smoothly, remain as still as possible, and swallow only when you're asked to do so.  A member of your health care team may move the catheter down into your stomach while the catheter continues its measurements.  The catheter then is slowly withdrawn. The test usually lasts 20 to 30 minutes.  After esophageal manometry  When your esophageal manometry is complete, you may return to your normal activities  This test typically takes 30-45 minutes to complete. ________________________________________________________________________________ We have sent the following medications to your pharmacy for you to pick up at your convenience:  Linzess 290 mcg

## 2022-09-03 ENCOUNTER — Ambulatory Visit: Payer: Medicare Other | Admitting: Internal Medicine

## 2022-09-30 ENCOUNTER — Telehealth: Payer: Self-pay | Admitting: Internal Medicine

## 2022-09-30 NOTE — Telephone Encounter (Signed)
Records faxed as requested.

## 2022-09-30 NOTE — Telephone Encounter (Signed)
Inbound call from [place] with [facility] in [place] requesting ov notes from 04/02/22, 04/30/22 and also 07/02/22. She is also requesting previous procedure and pathology report be faxed over to [phone]. Eber Jones can be reached at [phone]. Please advise.   Thank you

## 2022-12-01 ENCOUNTER — Telehealth: Payer: Self-pay

## 2022-12-01 ENCOUNTER — Other Ambulatory Visit: Payer: Self-pay

## 2022-12-01 DIAGNOSIS — R1310 Dysphagia, unspecified: Secondary | ICD-10-CM

## 2022-12-01 NOTE — Telephone Encounter (Signed)
Spoke with the patient. Per his request, the esophageal manometry is rescheduled. He agrees to the date of 03/24/23 at 12:30 pm arriving at 12:00 pm. New instructions are mailed to his home. No further questions at this time.

## 2022-12-02 ENCOUNTER — Ambulatory Visit (HOSPITAL_COMMUNITY): Admission: RE | Admit: 2022-12-02 | Payer: Medicare Other | Source: Home / Self Care | Admitting: Internal Medicine

## 2022-12-02 ENCOUNTER — Encounter (HOSPITAL_COMMUNITY): Admission: RE | Payer: Self-pay | Source: Home / Self Care

## 2022-12-02 SURGERY — MANOMETRY, ESOPHAGUS

## 2023-03-24 ENCOUNTER — Encounter (HOSPITAL_COMMUNITY): Admission: RE | Payer: Self-pay | Source: Home / Self Care

## 2023-03-24 ENCOUNTER — Other Ambulatory Visit: Payer: Self-pay

## 2023-03-24 ENCOUNTER — Ambulatory Visit (HOSPITAL_COMMUNITY): Admission: RE | Admit: 2023-03-24 | Payer: Medicare Other | Source: Home / Self Care | Admitting: Internal Medicine

## 2023-03-24 ENCOUNTER — Telehealth: Payer: Self-pay

## 2023-03-24 DIAGNOSIS — R1310 Dysphagia, unspecified: Secondary | ICD-10-CM

## 2023-03-24 SURGERY — MANOMETRY, ESOPHAGUS

## 2023-03-24 NOTE — Telephone Encounter (Signed)
Patient did not go to the esophageal manometry today.  Called the patient. He does want to reschedule. He asks for an 8:30 am appointment.  Scheduled the patient for first available 8:30 am opening 08/04/23. Called the patient to give him the date. No answer. No voicemail. Information mailed to the patient with the date of the appointment and the arrival time for the esophageal manometry.

## 2023-07-29 NOTE — Progress Notes (Signed)
 Attempted x2 to call patient to confirm appointment for esophageal manometry. No answer and unable to leave message.

## 2023-07-30 ENCOUNTER — Telehealth: Payer: Self-pay | Admitting: Internal Medicine

## 2023-07-30 NOTE — Telephone Encounter (Signed)
 Inbound call from patient, would like to speak with a nurse in regards to weight fluctuation. He states he is having changes in his bowel habits and weight loss and would like lab tests ordered to further advise him on what to do. Please advise.

## 2023-07-30 NOTE — Telephone Encounter (Signed)
 Spoke with patient & he has complaints of nausea w/o vomiting, weight gain (161 lb last week to 179 lb today), and SOB at rest occasionally. He takes 40 mg of lasix daily. He noticed swelling in lower extremities & doubled dose yesterday and has been elevating BLE. Phenergan PRN for nausea. He made his PCP & cardio aware, and they did not have an appointment available for him today. Advised patient given his symptoms he should go to urgent care for a sooner evaluation. Pt verbalized all understanding.

## 2023-08-04 ENCOUNTER — Encounter (HOSPITAL_COMMUNITY)
Admission: RE | Disposition: A | Discharge status: Home / Self Care | Payer: Self-pay | Source: Home / Self Care | Attending: Internal Medicine

## 2023-08-04 ENCOUNTER — Ambulatory Visit (HOSPITAL_COMMUNITY)
Admission: RE | Admit: 2023-08-04 | Discharge: 2023-08-04 | Disposition: A | Discharge status: Home / Self Care | Payer: Medicare Other | Attending: Internal Medicine | Admitting: Internal Medicine

## 2023-08-04 DIAGNOSIS — R1310 Dysphagia, unspecified: Secondary | ICD-10-CM

## 2023-08-04 HISTORY — PX: ESOPHAGEAL MANOMETRY: SHX5429

## 2023-08-04 SURGERY — MANOMETRY, ESOPHAGUS

## 2023-08-04 MED ORDER — LIDOCAINE VISCOUS HCL 2 % MT SOLN
OROMUCOSAL | Status: AC
  Filled 2023-08-04: qty 15

## 2023-08-04 SURGICAL SUPPLY — 2 items
FACESHIELD LNG OPTICON STERILE (SAFETY) IMPLANT
GLOVE BIO SURGEON STRL SZ8 (GLOVE) ×2 IMPLANT

## 2023-08-04 NOTE — Progress Notes (Addendum)
 Esophageal manometry performed per protocol.  Patient tolerated well.

## 2023-08-06 ENCOUNTER — Encounter (HOSPITAL_COMMUNITY): Payer: Self-pay | Admitting: Internal Medicine

## 2023-08-10 DIAGNOSIS — R1310 Dysphagia, unspecified: Secondary | ICD-10-CM

## 2023-08-11 ENCOUNTER — Telehealth: Payer: Self-pay | Admitting: Internal Medicine

## 2023-08-11 NOTE — Telephone Encounter (Signed)
 Inbound call from patient requesting to know if 3/19 manometry results have came in. Requesting a call back. Please advise, thank you.

## 2023-08-11 NOTE — Telephone Encounter (Signed)
 Left message for pt to call back

## 2023-08-11 NOTE — Telephone Encounter (Signed)
 Pt requesting results from esophageal manometry. Chart was reviewed and noted that the results have not been read yet. Pt was notified that once the provider has the results and reviews them then we will reach out to him and make him aware.   Pt verbalized understanding with all questions answered.

## 2023-08-12 ENCOUNTER — Encounter: Payer: Self-pay | Admitting: Internal Medicine

## 2023-08-13 ENCOUNTER — Other Ambulatory Visit: Payer: Self-pay

## 2023-08-13 DIAGNOSIS — R1310 Dysphagia, unspecified: Secondary | ICD-10-CM

## 2023-08-13 DIAGNOSIS — R1010 Upper abdominal pain, unspecified: Secondary | ICD-10-CM

## 2023-08-13 NOTE — Progress Notes (Signed)
SSA

## 2023-08-16 ENCOUNTER — Other Ambulatory Visit

## 2023-08-16 DIAGNOSIS — R1010 Upper abdominal pain, unspecified: Secondary | ICD-10-CM

## 2023-08-16 DIAGNOSIS — R1310 Dysphagia, unspecified: Secondary | ICD-10-CM

## 2023-08-17 LAB — SJOGREN'S SYNDROME ANTIBODS(SSA + SSB)
SSA (Ro) (ENA) Antibody, IgG: <1 AI
SSB (La) (ENA) Antibody, IgG: <1 AI

## 2023-08-17 LAB — ANA: Anti Nuclear Antibody (ANA): NEGATIVE

## 2023-08-17 LAB — ANTI-DNA ANTIBODY, DOUBLE-STRANDED: ds DNA Ab: <1 [IU]/mL

## 2023-08-18 ENCOUNTER — Encounter: Payer: Self-pay | Admitting: Internal Medicine

## 2023-08-18 NOTE — Telephone Encounter (Signed)
Please see notes below and advise 

## 2023-08-18 NOTE — Telephone Encounter (Signed)
Inbound call from patient in regards to results. Please advise.

## 2023-08-18 NOTE — Telephone Encounter (Signed)
 Dr Leonides Schanz is POD B will send accordingly.

## 2023-08-18 NOTE — Telephone Encounter (Signed)
 Pt made aware of recent results and Dr. Leonides Schanz recommendations: Pt verbalized understanding with all questions answered.

## 2023-11-17 ENCOUNTER — Encounter: Payer: Self-pay | Admitting: Internal Medicine

## 2023-11-17 ENCOUNTER — Other Ambulatory Visit (HOSPITAL_COMMUNITY): Payer: Self-pay

## 2023-11-17 ENCOUNTER — Ambulatory Visit (INDEPENDENT_AMBULATORY_CARE_PROVIDER_SITE_OTHER): Admitting: Internal Medicine

## 2023-11-17 ENCOUNTER — Telehealth: Payer: Self-pay | Admitting: Internal Medicine

## 2023-11-17 ENCOUNTER — Other Ambulatory Visit (INDEPENDENT_AMBULATORY_CARE_PROVIDER_SITE_OTHER)

## 2023-11-17 ENCOUNTER — Telehealth: Payer: Self-pay

## 2023-11-17 VITALS — BP 110/84 | HR 72 | Ht 69.0 in | Wt 199.4 lb

## 2023-11-17 DIAGNOSIS — R17 Unspecified jaundice: Secondary | ICD-10-CM

## 2023-11-17 DIAGNOSIS — K5900 Constipation, unspecified: Secondary | ICD-10-CM

## 2023-11-17 DIAGNOSIS — K224 Dyskinesia of esophagus: Secondary | ICD-10-CM

## 2023-11-17 DIAGNOSIS — R748 Abnormal levels of other serum enzymes: Secondary | ICD-10-CM

## 2023-11-17 DIAGNOSIS — R1310 Dysphagia, unspecified: Secondary | ICD-10-CM

## 2023-11-17 DIAGNOSIS — R1010 Upper abdominal pain, unspecified: Secondary | ICD-10-CM

## 2023-11-17 LAB — COMPREHENSIVE METABOLIC PANEL WITH GFR
ALT: 141 U/L — ABNORMAL HIGH (ref 0–53)
AST: 90 U/L — ABNORMAL HIGH (ref 0–37)
Albumin: 4.4 g/dL (ref 3.5–5.2)
Alkaline Phosphatase: 109 U/L (ref 39–117)
BUN: 14 mg/dL (ref 6–23)
CO2: 30 meq/L (ref 19–32)
Calcium: 9 mg/dL (ref 8.4–10.5)
Chloride: 104 meq/L (ref 96–112)
Creatinine, Ser: 0.8 mg/dL (ref 0.40–1.50)
GFR: 108.54 mL/min (ref >60.00)
Glucose, Bld: 87 mg/dL (ref 70–99)
Potassium: 3.9 meq/L (ref 3.5–5.1)
Sodium: 140 meq/L (ref 135–145)
Total Bilirubin: 1.3 mg/dL — ABNORMAL HIGH (ref 0.2–1.2)
Total Protein: 7 g/dL (ref 6.0–8.3)

## 2023-11-17 LAB — LIPASE: Lipase: 60 U/L — ABNORMAL HIGH (ref 11.0–59.0)

## 2023-11-17 LAB — IBC + FERRITIN
Ferritin: 9.8 ng/mL — ABNORMAL LOW (ref 22.0–322.0)
Iron: 72 ug/dL (ref 42–165)
Saturation Ratios: 15.1 % — ABNORMAL LOW (ref 20.0–50.0)
TIBC: 476 ug/dL — ABNORMAL HIGH (ref 250.0–450.0)
Transferrin: 340 mg/dL (ref 212.0–360.0)

## 2023-11-17 LAB — CBC WITH DIFFERENTIAL/PLATELET
Basophils Absolute: 0 10*3/uL (ref 0.0–0.1)
Basophils Relative: 0.3 % (ref 0.0–3.0)
Eosinophils Absolute: 0 10*3/uL (ref 0.0–0.7)
Eosinophils Relative: 0.2 % (ref 0.0–5.0)
HCT: 41.2 % (ref 39.0–52.0)
Hemoglobin: 13.5 g/dL (ref 13.0–17.0)
Lymphocytes Relative: 34.1 % (ref 12.0–46.0)
Lymphs Abs: 2 10*3/uL (ref 0.7–4.0)
MCHC: 32.8 g/dL (ref 30.0–36.0)
MCV: 89.2 fl (ref 78.0–100.0)
Monocytes Absolute: 0.6 10*3/uL (ref 0.1–1.0)
Monocytes Relative: 10.5 % (ref 3.0–12.0)
Neutro Abs: 3.3 10*3/uL (ref 1.4–7.7)
Neutrophils Relative %: 54.9 % (ref 43.0–77.0)
Platelets: 163 10*3/uL (ref 150.0–400.0)
RBC: 4.62 Mil/uL (ref 4.22–5.81)
RDW: 14.2 % (ref 11.5–15.5)
WBC: 6 10*3/uL (ref 4.0–10.5)

## 2023-11-17 LAB — VITAMIN D 25 HYDROXY (VIT D DEFICIENCY, FRACTURES): VITD: 29.95 ng/mL — ABNORMAL LOW (ref 30.00–100.00)

## 2023-11-17 LAB — VITAMIN B12: Vitamin B-12: 375 pg/mL (ref 211–911)

## 2023-11-17 MED ORDER — PRUCALOPRIDE SUCCINATE 2 MG PO TABS: 2.0000 mg | ORAL_TABLET | Freq: Every day | ORAL | 1 refills | Status: DC

## 2023-11-17 NOTE — Patient Instructions (Addendum)
 Your provider has requested that you go to the basement level for lab work before leaving today. Press B on the elevator. The lab is located at the first door on the left as you exit the elevator.  You have been scheduled for an MRI/MRCP Abdomen at [facility] on 11/25/23. Your appointment time is 7 am. Please arrive to admitting (at main entrance of the hospital) 30 minutes prior to your appointment time for registration purposes. Please make certain not to have anything to eat or drink 4 hours prior to your test. In addition, if you have any metal in your body, have a pacemaker or defibrillator, please be sure to let your ordering physician know. This test typically takes 45 minutes to 1 hour to complete. Should you need to reschedule, please call [phone] to do so.  We have sent the following medications to your pharmacy for you to pick up at your convenience:Motegrity  Start taking daily fiber supplement   Follow up in 3 months If your blood pressure at your visit was 140/90 or greater, please contact your primary care physician to follow up on this.  _______________________________________________________  If you are age 74 or older, your body mass index should be between 23-30. Your Body mass index is 29.44 kg/m. If this is out of the aforementioned range listed, please consider follow up with your Primary Care Provider.  If you are age 55 or younger, your body mass index should be between 19-25. Your Body mass index is 29.44 kg/m. If this is out of the aformentioned range listed, please consider follow up with your Primary Care Provider.   ________________________________________________________  The [place] [facility] providers would like to encourage you to use [facility] to communicate with providers for non-urgent requests or questions.  Due to long hold times on the telephone, sending your provider a message by [facility] may be a faster and more efficient way to get a response.   Please allow 48 business hours for a response.  Please remember that this is for non-urgent requests.  _______________________________________________________

## 2023-11-17 NOTE — Progress Notes (Signed)
 Chief Complaint: Epigastric ab pain  HPI: [name] is a 43 year old African-American male with a past medical history of DM, GERD, OSA, migraine, asthma, and gastric bypass presents with epigastric ab pain     Interval History: Swallowing is better overall. He tries to eat slower and maintain what he eats. Weight is going up. He feels bloated overall. Constipation is still present. He takes the Linzess  every day 290 mcg every day. He is having one BM per day with Linzess , but he doesn't feel like he is emptying completely. Endorses ab pain in the upper area. If he eats any foods besides fruits/vegetables, it will cause him to reject it. Denies use of suppositories. He is still on Ozempic. Denies N&V. He has one beer per week. He has been smoke free for 2 years.   Wt Readings from Last 3 Encounters:  11/17/23 199 lb 6 oz (90.4 kg)  07/02/22 199 lb (90.3 kg)  04/30/22 194 lb (88 kg)   Current Outpatient Medications  Medication Sig Dispense Refill   acetaminophen  (TYLENOL ) 325 MG tablet Take 650 mg by mouth every 6 (six) hours as needed.     albuterol  (VENTOLIN  HFA) 108 (90 Base) MCG/ACT inhaler Inhale 2 puffs into the lungs every 6 (six) hours as needed. For shortness of breath.     allopurinol (ZYLOPRIM) 300 MG tablet Take 300 mg by mouth daily.     AMBULATORY NON FORMULARY MEDICATION 90ml viscoux lidocaine  90ml 10/72ml dicylomine  270ml Maalox  Total of 450ml 270 mL 1   Ascorbic Acid (VITAMIN C) 500 MG tablet Take 500 mg by mouth daily.       aspirin  81 MG tablet Take 81 mg by mouth daily.     atorvastatin (LIPITOR) 80 MG tablet Take 80 mg by mouth daily.     BREO ELLIPTA 100-25 MCG/ACT AEPB Inhale 1 puff into the lungs daily.     Buprenorphine HCl-Naloxone HCl 8-2 MG FILM Take 1 Film by mouth 2 (two) times daily.     cetirizine (ZYRTEC) 10 MG tablet Take 10 mg by mouth daily.     cholecalciferol (VITAMIN D) 1000 UNITS tablet Take 1,000 Units by mouth daily.     Cyanocobalamin  (VITAMIN B-12 IJ) Inject 1,000 mcg as directed once a week.     dicyclomine (BENTYL) 10 MG capsule TAKE 1 CAPSULE BY MOUTH 4 TIMES A DAY AS NEEDED FOR ABDOMINAL PAIN     famotidine  (PEPCID ) 40 MG tablet Take 1 tablet (40 mg total) by mouth 2 (two) times daily. 60 tablet 5   ferrous gluconate (FERGON) 324 MG tablet Take 324 mg by mouth daily with breakfast.     fluticasone  (FLONASE ) 50 MCG/ACT nasal spray Place 2 sprays into the nose daily.     fluticasone  (FLOVENT  HFA) 44 MCG/ACT inhaler Inhale 2 puffs into the lungs 2 (two) times daily.     furosemide (LASIX) 40 MG tablet Take 40 mg by mouth daily.     gabapentin  (NEURONTIN ) 600 MG tablet Take 600 mg by mouth 4 (four) times daily.     ipratropium-albuterol  (DUONEB) 0.5-2.5 (3) MG/3ML SOLN Take 3 mLs by nebulization every 6 (six) hours as needed. For shortness of breath.     levothyroxine (SYNTHROID, LEVOTHROID) 25 MCG tablet Take 25 mcg by mouth daily before breakfast.     linaclotide  (LINZESS ) 290 MCG CAPS capsule Take 1 capsule (290 mcg total) by mouth daily before breakfast. 90 capsule 3   mirtazapine (REMERON) 7.5 MG tablet Take  7.5 mg by mouth at bedtime.     OLANZapine (ZYPREXA) 15 MG tablet Take 15 mg by mouth daily.     omeprazole  (PRILOSEC) 40 MG capsule Take 1 capsule (40 mg total) by mouth 2 (two) times daily. 60 capsule 5   OZEMPIC, 0.25 OR 0.5 MG/DOSE, 2 MG/3ML SOPN Inject 0.25 mg into the skin once a week.     potassium chloride  SA (K-DUR,KLOR-CON ) 20 MEQ tablet Take 1 tablet (20 mEq total) daily by mouth. 3 tablet 0   ranitidine (ZANTAC) 150 MG tablet Take 150 mg by mouth 2 (two) times daily.     Riboflavin (VITAMIN B2 PO) Take 500 mg by mouth daily.     sildenafil (VIAGRA) 100 MG tablet Take 100 mg by mouth as needed.     tamsulosin (FLOMAX) 0.4 MG CAPS capsule Take 0.4 mg by mouth daily after supper.     thiamine 100 MG tablet Take by mouth.     tiZANidine (ZANAFLEX) 4 MG tablet Take 4 mg by mouth every 12 (twelve) hours.      umeclidinium-vilanterol (ANORO ELLIPTA) 62.5-25 MCG/ACT AEPB Inhale into the lungs.     Current Facility-Administered Medications  Medication Dose Route Frequency Provider Last Rate Last Admin   dextrose  10 % infusion   Intravenous Continuous Federico Rosario BROCKS, MD        Physical Exam:  Vital signs: BP 110/84 (BP Location: Left Arm, Patient Position: Sitting, Cuff Size: Normal)   Pulse 72   Ht 5' 9 (1.753 m)   Wt 199 lb 6 oz (90.4 kg)   BMI 29.44 kg/m    Constitutional:   Pleasant thin appearing AA male appears to be in NAD, Well developed, Well nourished, alert and cooperative Respiratory: Respirations even and unlabored. Lungs clear to auscultation bilaterally.   No wheezes, crackles, or rhonchi.  Cardiovascular: Normal S1, S2. No MRG. Regular rate and rhythm. No peripheral edema, cyanosis or pallor.  Gastrointestinal:  Soft, nondistended, tender in the upper abdomen. Quiet bowel sounds. Rectal:  Not performed.  Psychiatric: Demonstrates good judgement and reason without abnormal affect or behaviors.  RELEVANT LABS AND IMAGING: CBC    Component Value Date/Time   WBC 5.8 04/03/2017 0040   RBC 4.97 04/03/2017 0040   HGB 13.6 04/03/2017 0040   HGB 14.4 06/07/2013 1443   HCT 42.5 04/03/2017 0040   HCT 44.4 06/07/2013 1443   PLT 156 04/03/2017 0040   PLT 175 06/07/2013 1443   MCV 85.5 04/03/2017 0040   MCV 85 06/07/2013 1443   MCH 27.4 04/03/2017 0040   MCHC 32.0 04/03/2017 0040   RDW 13.9 04/03/2017 0040   RDW 14.5 06/07/2013 1443   LYMPHSABS 1.8 02/16/2017 0545   LYMPHSABS 2.3 01/06/2012 0315   MONOABS 0.4 02/16/2017 0545   MONOABS 0.7 01/06/2012 0315   EOSABS 0.0 02/16/2017 0545   EOSABS 0.0 01/06/2012 0315   BASOSABS 0.0 02/16/2017 0545   BASOSABS 0.0 01/06/2012 0315    CMP     Component Value Date/Time   NA 140 04/03/2017 0040   NA 134 (L) 06/08/2013 0408   K 3.0 (L) 04/03/2017 0040   K 3.7 06/08/2013 0408   CL 107 04/03/2017 0040   CL 99 06/08/2013 0408    CO2 24 04/03/2017 0040   CO2 29 06/08/2013 0408   GLUCOSE 160 (H) 04/03/2017 0040   GLUCOSE 373 (H) 06/08/2013 0408   BUN 8 04/03/2017 0040   BUN 7 06/08/2013 0408   CREATININE 0.84 04/03/2017 0040  CREATININE 1.04 06/08/2013 0408   CALCIUM 9.3 04/03/2017 0040   CALCIUM 9.1 06/08/2013 0408   PROT 7.1 06/30/2021 1239   PROT 8.3 (H) 06/07/2013 1443   ALBUMIN 4.6 06/30/2021 1239   ALBUMIN 4.3 06/07/2013 1443   AST 24 06/30/2021 1239   AST 22 06/07/2013 1443   ALT 26 06/30/2021 1239   ALT 33 06/07/2013 1443   ALKPHOS 70 06/30/2021 1239   ALKPHOS 75 06/07/2013 1443   BILITOT 1.6 (H) 07/14/2021 0829   BILITOT 0.7 06/07/2013 1443   GFRNONAA >60 04/03/2017 0040   GFRNONAA >60 06/08/2013 0408   GFRAA >60 04/03/2017 0040   GFRAA >60 06/08/2013 0408   Labs 02/2022: Elevated lipase of 191. Elevated total bilirubin of 1.7  Labs 04/2022: C4 complement normal. Alpha gal panel negative. Serum 5-HIAA normal.  Urine porphobilinogen normal. C1 esterase inhibitor nml. Tryptase nml.   Labs 01/2023: CBC with mildly low Hb of 12.2. CMP with mildly low K of 3.3 and mildly elevated AST of 63.  Labs 07/2023: ANA negative. Anti-dsDNA negative. Sjogren's SSA and SSB negative.  CTA A/P w/contrast 04/16/22: IMPRESSION: VASCULAR 1. Widely patent mesenteric vasculature. No significant atherosclerotic plaque or findings of mesenteric ischemia. NON-VASCULAR 1. No acute findings in the abdomen or pelvis. Postsurgical changes of gastric bypass without complicating feature by imaging.  CT A/P w/o contrast 05/05/22: 1. Mild left hydronephrosis appears stable to improved in the  interval. No obstructing stone identified.  2. Normal appearance of the right kidney.  3. Mild circumferential wall thickening of the urinary bladder is  new from previous exam and may reflect incomplete distension.  Correlate for any clinical signs or symptoms of cystitis.  4. Moderate stool burden noted throughout the colon.  Correlate for  any clinical signs or symptoms of constipation.   Colonoscopy 12/05/21: Normal colonoscopy.  EGD 04/02/22: - Normal esophagus. Biopsied. - Gastric bypass with a normal-sized pouch and intact staple line. Gastrojejunal anastomosis characterized by healthy appearing mucosa. Biopsied. - Normal examined jejunum. Biopsied. Path: 1. Surgical [P], duodenal bx - BENIGN SMALL BOWEL MUCOSA WITH NO SIGNIFICANT PATHOLOGIC CHANGES 2. Surgical [P], gastric bx - GASTRIC ANTRAL AND OXYNTIC MUCOSA WITH FEATURES OF REACTIVE GASTROPATHY - NEGATIVE FOR H. PYLORI ON H&E STAIN - NEGATIVE FOR INTESTINAL METAPLASIA OR MALIGNANCY 3. 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 Will have him try Motegrity along with Linzess  to see if this is able to get his constipation under better control. Will have him also add on a daily fiber supplement. If this is not effective, then may need to consider pelvic floor PT and/or stopping/decreasing the dose of his Ozempic in the future. Patient also describes ongoing upper abdominal pain so will perform some labs and plan for MRI abd/MRCP for further evaluation since he does have a history of elevated lipase and total bilirubin that could suggest pancreatitis or biliary obstruction. If MRI is unrevealing, then his upper abdominal pain may be primarily related to constipation. His dysphagia has been attributed to absent contractility so will get labs to rule out scleroderma as a contributor. His prior gastric bypass surgery may be a  source of  his absent contractility as well. - Low FODMAP diet - Check CBC, CMP, lipase, ferritin/IBC, vit D, vitamin B12, SCL-70 and anti-centromere - Start daily fiber supplement - Cont Linzess  290 mcg every day - Start Motegrity 2 mg QD - Check MRI abd/MRCP w/contrast for history of elevated bilirubin and upper abdominal pain - Could consider decreasing the dose of Ozempic in the future - Next colonoscopy due in 11/2031 for colon cancer screening - If dysphagia were to worsen in the future, then could consider barium swallow and/or EndoFLIP for further evaluation - RTC 3 months  Estefana Kidney, MD  I spent 36 minutes of time, including in depth chart review, independent review of results as outlined above, communicating results with the patient directly, face-to-face time with the patient, coordinating care, ordering studies and medications as appropriate, and documentation.

## 2023-11-17 NOTE — Telephone Encounter (Signed)
 PT is calling to update that the Motegrity he was prescribed today needs a PA. Please advise.

## 2023-11-17 NOTE — Telephone Encounter (Signed)
 Pharmacy Patient Advocate Encounter   Received notification from Pt Calls Messages that prior authorization for Motegrity 2MG  tablets is required/requested.   Insurance verification completed.   The patient is insured through [employer] [place] .   Per test claim: PA required; PA submitted to above mentioned insurance via CoverMyMeds Key/confirmation #/EOC [employer] Status is pending

## 2023-11-18 ENCOUNTER — Other Ambulatory Visit: Payer: Self-pay

## 2023-11-18 LAB — ANTI-SCLERODERMA ANTIBODY: Scleroderma (Scl-70) (ENA) Antibody, IgG: <1 AI

## 2023-11-18 LAB — SPECIMEN STATUS REPORT

## 2023-11-18 LAB — CENTROMERE ANTIBODIES: Centromere Ab Screen: <0.2 AI (ref 0.0–0.9)

## 2023-11-18 MED ORDER — PRUCALOPRIDE SUCCINATE 2 MG PO TABS: 2.0000 mg | ORAL_TABLET | Freq: Every day | ORAL | 1 refills | Status: AC

## 2023-11-18 NOTE — Telephone Encounter (Signed)
 Patient requesting pantoprazole prescription resent into [facility] in [place]. Please advise.   Thank you

## 2023-11-18 NOTE — Telephone Encounter (Signed)
 Pharmacy Patient Advocate Encounter  Received notification from [employer] that Prior Authorization for Motegrity 2MG  tablets has been APPROVED from 05-19-2023 to 05-17-2024   PA #/Case ID/Reference #: [id number]

## 2023-11-18 NOTE — Telephone Encounter (Signed)
 Called patient back he stated he wanted Motegrity 2MG  tablets sent to the [facility] pharmacy in [place] not pantoprazole sent Rx to pharmacy.

## 2023-11-19 ENCOUNTER — Ambulatory Visit: Payer: Self-pay | Admitting: Internal Medicine

## 2023-11-24 ENCOUNTER — Other Ambulatory Visit: Payer: Self-pay | Admitting: Internal Medicine

## 2023-11-24 DIAGNOSIS — K5900 Constipation, unspecified: Secondary | ICD-10-CM

## 2023-11-24 DIAGNOSIS — R17 Unspecified jaundice: Secondary | ICD-10-CM

## 2023-11-24 DIAGNOSIS — R1010 Upper abdominal pain, unspecified: Secondary | ICD-10-CM

## 2023-11-24 DIAGNOSIS — R748 Abnormal levels of other serum enzymes: Secondary | ICD-10-CM

## 2023-11-24 DIAGNOSIS — R1310 Dysphagia, unspecified: Secondary | ICD-10-CM

## 2023-11-24 DIAGNOSIS — K224 Dyskinesia of esophagus: Secondary | ICD-10-CM

## 2023-11-25 ENCOUNTER — Ambulatory Visit: Payer: Self-pay | Admitting: Internal Medicine

## 2023-11-25 ENCOUNTER — Ambulatory Visit (HOSPITAL_COMMUNITY)
Admission: RE | Admit: 2023-11-25 | Discharge: 2023-11-25 | Disposition: A | Discharge status: Home / Self Care | Source: Ambulatory Visit | Attending: Internal Medicine | Admitting: Internal Medicine

## 2023-11-25 DIAGNOSIS — R1310 Dysphagia, unspecified: Secondary | ICD-10-CM | POA: Diagnosis present

## 2023-11-25 DIAGNOSIS — R1010 Upper abdominal pain, unspecified: Secondary | ICD-10-CM | POA: Diagnosis present

## 2023-11-25 DIAGNOSIS — R748 Abnormal levels of other serum enzymes: Secondary | ICD-10-CM | POA: Insufficient documentation

## 2023-11-25 DIAGNOSIS — K5900 Constipation, unspecified: Secondary | ICD-10-CM | POA: Insufficient documentation

## 2023-11-25 DIAGNOSIS — R17 Unspecified jaundice: Secondary | ICD-10-CM | POA: Insufficient documentation

## 2023-11-25 DIAGNOSIS — K224 Dyskinesia of esophagus: Secondary | ICD-10-CM | POA: Insufficient documentation

## 2023-11-25 MED ORDER — GADOBUTROL 1 MMOL/ML IV SOLN
9.0000 mL | Freq: Once | INTRAVENOUS | Status: AC | PRN
  Administered 2023-11-25: 9 mL via INTRAVENOUS
